# Patient Record
Sex: Female | Born: 1985 | Race: White | Hispanic: Yes | Marital: Married | Smoking: Never smoker
Health system: Southern US, Community
[De-identification: ages and names within clinical notes are randomized; demographics above are authoritative.]

## PROBLEM LIST (undated history)

## (undated) DIAGNOSIS — K5900 Constipation, unspecified: Secondary | ICD-10-CM

## (undated) DIAGNOSIS — Z789 Other specified health status: Secondary | ICD-10-CM

## (undated) DIAGNOSIS — T7840XA Allergy, unspecified, initial encounter: Secondary | ICD-10-CM

## (undated) DIAGNOSIS — M549 Dorsalgia, unspecified: Secondary | ICD-10-CM

## (undated) DIAGNOSIS — R609 Edema, unspecified: Secondary | ICD-10-CM

## (undated) DIAGNOSIS — R0602 Shortness of breath: Secondary | ICD-10-CM

## (undated) DIAGNOSIS — N200 Calculus of kidney: Secondary | ICD-10-CM

## (undated) HISTORY — DX: Dorsalgia, unspecified: M54.9

## (undated) HISTORY — PX: COSMETIC SURGERY: SHX468

## (undated) HISTORY — DX: Constipation, unspecified: K59.00

## (undated) HISTORY — DX: Shortness of breath: R06.02

## (undated) HISTORY — DX: Edema, unspecified: R60.9

## (undated) HISTORY — PX: TUBAL LIGATION: SHX77

## (undated) HISTORY — PX: APPENDECTOMY: SHX54

## (undated) HISTORY — DX: Allergy, unspecified, initial encounter: T78.40XA

## (undated) HISTORY — PX: EYE SURGERY: SHX253

---

## 2007-12-22 HISTORY — PX: EYE SURGERY: SHX253

## 2013-04-04 ENCOUNTER — Ambulatory Visit (INDEPENDENT_AMBULATORY_CARE_PROVIDER_SITE_OTHER): Payer: BC Managed Care – PPO | Admitting: Family Medicine

## 2013-04-04 VITALS — BP 114/72 | HR 73 | Temp 98.6°F | Resp 18 | Ht 62.5 in | Wt 124.0 lb

## 2013-04-04 DIAGNOSIS — Z309 Encounter for contraceptive management, unspecified: Secondary | ICD-10-CM

## 2013-04-04 DIAGNOSIS — IMO0001 Reserved for inherently not codable concepts without codable children: Secondary | ICD-10-CM

## 2013-04-04 MED ORDER — DROSPIRENONE-ETHINYL ESTRADIOL 3-0.02 MG PO TABS: 1.0000 | ORAL_TABLET | Freq: Every day | ORAL | Status: DC

## 2013-04-04 NOTE — Progress Notes (Signed)
[facility] [address], [place] [address] [phone]  Date:  04/04/2013   Name:  Allison Fischer   DOB:  1986-05-23   MRN:  [id number]  PCP:  No primary provider on file.    Chief Complaint: rx refills   History of Present Illness:  Allison Fischer is a 27 y.o. very pleasant female patient who presents with the following:  She is taking OCP- she has bought this in [place] O[facility], but now needs an rx for the same medication because [place].   She has been on OCP for several months.   Her LMP was on 9/19.  She is coming to the end of her current OCP pack   She is healthy, is a non- smoker.   She does not get migraine HA, no history of CVA, PE/DVT or MI.  No history of aura with headache.  Here today with her husband.   Her most recent pap was in May- normal  There are no active problems to display for this patient.   Past Medical History  Diagnosis Date  . Allergy     Past Surgical History  Procedure Laterality Date  . Eye surgery      History  Substance Use Topics  . Smoking status: Never Smoker   . Smokeless tobacco: Not on file  . Alcohol Use: Yes    Family History  Problem Relation Age of Onset  . Hypertension Maternal Grandmother   . Cancer Maternal Grandmother   . Heart attack Maternal Grandfather   . Heart attack Paternal Grandfather     Allergies  Allergen Reactions  . Penicillins     Medication list has been reviewed and updated.  No current outpatient prescriptions on file prior to visit.   No current facility-administered medications on file prior to visit.    Review of Systems:  As per HPI- otherwise negative.   Physical Examination: Filed Vitals:   04/04/13 1307  BP: 114/72  Pulse: 73  Temp: 98.6 F (37 C)  Resp: 18   Filed Vitals:   04/04/13 1307  Height: 5' 2.5" (1.588 m)  Weight: 124 lb (56.246 kg)   Body mass index is 22.3 kg/(m^2). Ideal Body Weight: Weight in (lb) to have BMI =  25: 138.6  GEN: WDWN, NAD, Non-toxic, A & O x 3, trim build, looks well HEENT: Atraumatic, Normocephalic. Neck supple. No masses, No LAD. Ears and Nose: No external deformity. CV: RRR, No M/G/R. No JVD. No thrill. No extra heart sounds. PULM: CTA B, no wheezes, crackles, rhonchi. No retractions. No resp. distress. No accessory muscle use. EXTR: No c/c/e NEURO Normal gait.  PSYCH: Normally interactive. Conversant. Not depressed or anxious appearing.  Calm demeanor.    Assessment and Plan: Contraception - Plan: drospirenone-ethinyl estradiol (YAZ,GIANVI,LORYNA) 3-0.02 MG tablet  Refilled her OCP- she was taking a bayer product called yasminique which is equivalent to yaz.  Follow-up as needed.  Pap is UTD.  Refilled OCP for one year   Signed Abbe Amsterdam, MD

## 2013-04-04 NOTE — Patient Instructions (Signed)
Continue to take your birth control pill as directed.  If you have any problems please let me know.  Good to meet you today!

## 2013-08-30 ENCOUNTER — Ambulatory Visit (INDEPENDENT_AMBULATORY_CARE_PROVIDER_SITE_OTHER): Payer: BC Managed Care – PPO | Admitting: Emergency Medicine

## 2013-08-30 VITALS — BP 107/72 | HR 80 | Temp 98.7°F | Resp 18 | Wt 128.0 lb

## 2013-08-30 DIAGNOSIS — R197 Diarrhea, unspecified: Secondary | ICD-10-CM

## 2013-08-30 DIAGNOSIS — A088 Other specified intestinal infections: Secondary | ICD-10-CM

## 2013-08-30 LAB — POCT CBC
Granulocyte percent: 53.9 %G (ref 37–80)
HCT, POC: 44 % (ref 37.7–47.9)
HEMOGLOBIN: 14.2 g/dL (ref 12.2–16.2)
Lymph, poc: 1.2 (ref 0.6–3.4)
MCH: 29.3 pg (ref 27–31.2)
MCHC: 32.3 g/dL (ref 31.8–35.4)
MCV: 90.9 fL (ref 80–97)
MID (cbc): 0.2 (ref 0–0.9)
MPV: 9.1 fL (ref 0–99.8)
POC Granulocyte: 1.6 — AB (ref 2–6.9)
POC LYMPH PERCENT: 39 %L (ref 10–50)
POC MID %: 7.1 %M (ref 0–12)
Platelet Count, POC: 272 10*3/uL (ref 142–424)
RBC: 4.84 M/uL (ref 4.04–5.48)
RDW, POC: 12.8 %
WBC: 3 10*3/uL — AB (ref 4.6–10.2)

## 2013-08-30 MED ORDER — ONDANSETRON 8 MG PO TBDP: 8.0000 mg | ORAL_TABLET | Freq: Three times a day (TID) | ORAL | Status: DC | PRN

## 2013-08-30 MED ORDER — LOPERAMIDE HCL 2 MG PO TABS
ORAL_TABLET | ORAL | Status: DC
Start: 2013-08-30 — End: 2014-04-15

## 2013-08-30 NOTE — Progress Notes (Signed)
[facility] [address], [place] [id number] [phone]  Date:  08/30/2013   Name:  Allison LarryLaura Lovingood   DOB:  03/10/1986   MRN:  [id number]  PCP:  No primary provider on file.    Chief Complaint: Abdominal Pain, Emesis, Dizziness and Diarrhea   History of Present Illness:  Allison Fischer is a 28 y.o. very pleasant female patient who presents with the following:  Ill since yesterday with nausea, vomiting, and fever.  Has watery stools, The patient has no complaint of blood, mucous, or pus in her stools. Frequent vomiting.  Returned from [place] 2 weeks ago.  Denies hematemesis, melena or BRBPR.  Husband ill previously.  No improvement with over the counter medications or other home remedies. Denies other complaint or health concern today.    There are no active problems to display for this patient.   Past Medical History  Diagnosis Date  . Allergy     Past Surgical History  Procedure Laterality Date  . Eye surgery      History  Substance Use Topics  . Smoking status: Never Smoker   . Smokeless tobacco: Not on file  . Alcohol Use: Yes    Family History  Problem Relation Age of Onset  . Hypertension Maternal Grandmother   . Cancer Maternal Grandmother   . Heart attack Maternal Grandfather   . Heart attack Paternal Grandfather     Allergies  Allergen Reactions  . Penicillins     Medication list has been reviewed and updated.  Current Outpatient Prescriptions on File Prior to Visit  Medication Sig Dispense Refill  . drospirenone-ethinyl estradiol (YAZ,GIANVI,LORYNA) 3-0.02 MG tablet Take 1 tablet by mouth daily.  3 Package  4   No current facility-administered medications on file prior to visit.    Review of Systems:  As per HPI, otherwise negative.    Physical Examination: Filed Vitals:   08/30/13 1538  BP: 107/72  Pulse: 80  Temp: 98.7 F (37.1 C)  Resp: 18   Filed Vitals:   08/30/13 1538  Weight: 128 lb (58.06 kg)   Body  mass index is 23.02 kg/(m^2). Ideal Body Weight:    GEN: WDWN, NAD, Non-toxic, A & O x 3 HEENT: Atraumatic, Normocephalic. Neck supple. No masses, No LAD. Ears and Nose: No external deformity. CV: RRR, No M/G/R. No JVD. No thrill. No extra heart sounds. PULM: CTA B, no wheezes, crackles, rhonchi. No retractions. No resp. distress. No accessory muscle use. ABD: S, NT, ND, +BS. No rebound. No HSM. EXTR: No c/c/e NEURO Normal gait.  PSYCH: Normally interactive. Conversant. Not depressed or anxious appearing.  Calm demeanor.    Assessment and Plan: Gastroenteritis Imodium zofran   Signed,  Phillips OdorJeffery Falen Lehrmann, MD   Results for orders placed in visit on 08/30/13  POCT CBC      Result Value Ref Range   WBC 3.0 (*) 4.6 - 10.2 K/uL   Lymph, poc 1.2  0.6 - 3.4   POC LYMPH PERCENT 39.0  10 - 50 %L   MID (cbc) 0.2  0 - 0.9   POC MID % 7.1  0 - 12 %M   POC Granulocyte 1.6 (*) 2 - 6.9   Granulocyte percent 53.9  37 - 80 %G   RBC 4.84  4.04 - 5.48 M/uL   Hemoglobin 14.2  12.2 - 16.2 g/dL   HCT, POC 53.644.0  64.437.7 - 47.9 %   MCV 90.9  80 - 97 fL  MCH, POC 29.3  27 - 31.2 pg   MCHC 32.3  31.8 - 35.4 g/dL   RDW, POC 45.4     Platelet Count, POC 272  142 - 424 K/uL   MPV 9.1  0 - 99.8 fL

## 2013-08-30 NOTE — Patient Instructions (Signed)
Diet The clear liquid diet consists of foods that are liquid or will become liquid at room temperature. Examples of foods allowed on a clear liquid diet include fruit juice, broth or bouillon, gelatin, or frozen ice pops. You should be able to see through the liquid. The purpose of this diet is to provide the necessary fluids, electrolytes (such as sodium and potassium), and energy to keep the body functioning during times when you are not able to consume a regular diet. A clear liquid diet should not be continued for long periods of time, as it is not nutritionally adequate.  A CLEAR LIQUID DIET MAY BE NEEDED:  When a sudden-onset (acute) condition occurs before or after surgery.   As the first step in oral feeding.   For fluid and electrolyte replacement in diarrheal diseases.   As a diet before certain medical tests are performed.  ADEQUACY The clear liquid diet is adequate only in ascorbic acid, according to the Recommended Dietary Allowances of the [employer].  CHOOSING FOODS Breads and Starches  Allowed: None are allowed.   Avoid: All are to be avoided.  Vegetables  Allowed: Strained vegetable juices.   Avoid: Any others.  Fruit  Allowed: Strained fruit juices and fruit drinks. Include 1 serving of citrus or vitamin C-enriched fruit juice daily.   Avoid: Any others.  Meat and Meat Substitutes  Allowed: None are allowed.   Avoid: All are to be avoided.  Milk Products  Allowed: None are allowed.   Avoid: All are to be avoided.  Soups and Combination Foods  Allowed: Clear bouillon, broth, or strained broth-based soups.   Avoid: Any others.  Desserts and Sweets  Allowed: Sugar, honey. High-protein gelatin. Flavored gelatin, ices, or frozen ice pops that do not contain milk.   Avoid: Any others.  Fats and Oils  Allowed: None are allowed.   Avoid: All are to be avoided.  Beverages  Allowed: Cereal  beverages, coffee (regular or decaffeinated), tea, or soda at the discretion of your health care provider.   Avoid: Any others.  Condiments  Allowed: Salt.   Avoid: Any others, including pepper.  Supplements  Allowed: Liquid nutrition beverages that you can see through.   Avoid: Any others that contain lactose or fiber. SAMPLE MEAL PLAN Breakfast  4 oz (120 mL) strained orange juice.   to 1 cup (120 to 240 mL) gelatin (plain or fortified).  1 cup (240 mL) beverage (coffee or tea).  Sugar, if desired. Midmorning Snack   cup (120 mL) gelatin (plain or fortified). Lunch  1 cup (240 mL) broth or consomm.  4 oz (120 mL) strained grapefruit juice.   cup (120 mL) gelatin (plain or fortified).  1 cup (240 mL) beverage (coffee or tea).  Sugar, if desired. Midafternoon Snack   cup (120 mL) fruit ice.   cup (120 mL) strained fruit juice. Dinner  1 cup (240 mL) broth or consomm.   cup (120 mL) cranberry juice.   cup (120 mL) flavored gelatin (plain or fortified).  1 cup (240 mL) beverage (coffee or tea).  Sugar, if desired. Evening Snack  4 oz (120 mL) strained apple juice (vitamin C-fortified).   cup (120 mL) flavored gelatin (plain or fortified). MAKE SURE YOU:  Understand these instructions.  Will watch your child's condition.  Will get help right away if your child is not doing well or gets worse. Document Released: 06/09/2005 Document Revised: 02/09/2013 Document Reviewed: 11/09/2012 [facility] Patient Information 2014 [place], [place]. Gastroenteritis  viral (Viral Gastroenteritis) La gastroenteritis viral tambin es conocida como gripe del [place]estmago. Este trastorno Performance Food Groupafecta el estmago y el tubo digestivo. Puede causar diarrea y vmitos repentinos. La enfermedad generalmente dura entre 3 y [address] das. La Harley-Davidsonmayora de las personas desarrolla una respuesta inmunolgica. Con el tiempo, esto elimina el virus. Mientras se desarrolla esta respuesta  natural, el virus puede afectar en forma importante su salud.  CAUSAS Muchos virus diferentes pueden causar gastroenteritis, por ejemplo el rotavirus o el norovirus. Estos virus pueden contagiarse al consumir alimentos o agua contaminados. Tambin puede contagiarse al compartir utensilios u otros artculos personales con una persona infectada o al tocar una superficie contaminada.  SNTOMAS Los sntomas ms comunes son diarrea y vmitos. Estos problemas pueden causar una prdida grave de lquidos corporales(deshidratacin) y un desequilibrio de sales corporales(electrolitos). Otros sntomas pueden ser:   Grant RutsFiebre.  Dolor de Turkmenistancabeza.  Fatiga.  Dolor abdominal. DIAGNSTICO  El mdico podr hacer el diagnstico de gastroenteritis viral basndose en los sntomas y el examen fsico Tambin pueden tomarle una muestra de materia fecal para diagnosticar la presencia de virus u otras infecciones.  TRATAMIENTO Esta enfermedad generalmente desaparece sin tratamiento. Los tratamientos estn dirigidos a Social research officer, governmentla rehidratacin. Los casos ms graves de gastroenteritis viral implican vmitos tan intensos que no es posible retener lquidos. En Franklin Resourcesestos casos, los lquidos deben administrarse a travs de una va intravenosa (IV).  INSTRUCCIONES PARA EL CUIDADO DOMICILIARIO  Beba suficientes lquidos para mantener la orina clara o de color amarillo plido. Beba pequeas cantidades de lquido con frecuencia y aumente la cantidad segn la tolerancia.  Pida instrucciones especficas a su mdico con respecto a la rehidratacin.  Evite:  Alimentos que Nurse, adulttengan mucha azcar.  Alcohol.  Gaseosas.  TabacoVista Lawman.  Jugos.  Bebidas con cafena.  Lquidos muy calientes o fros.  Alimentos muy grasos.  Comer demasiado a Licensed conveyancerla vez.  Productos lcteos hasta 24 a 48 horas despus de que se detenga la diarrea.  Puede consumir probiticos. Los probiticos son cultivos activos de bacterias beneficiosas. Pueden disminuir la cantidad  y el nmero de deposiciones diarreicas en el adulto. Se encuentran en los yogures con cultivos activos y en los suplementos.  Lave bien sus manos para evitar que se disemine el virus.  Slo tome medicamentos de venta libre o recetados para Primary school teachercalmar el dolor, las molestias o bajar la fiebre segn las indicaciones de su mdico. No administre aspirina a los nios. Los medicamentos antidiarreicos no son recomendables.  Consulte a su mdico si puede seguir tomando sus medicamentos recetados o de H. J. Heinzventa libre.  Cumpla con todas las visitas de control, segn le indique su mdico. SOLICITE ATENCIN MDICA DE INMEDIATO SI:  No puede retener lquidos.  No hay emisin de orina durante 6 a 8 horas.  Le falta el aire.  Observa sangre en el vmito (se ve como caf molido) o en la materia fecal.  Siente dolor abdominal que empeora o se concentra en una zona pequea (se localiza).  Tiene nuseas o vmitos persistentes.  Tiene fiebre.  El paciente es un nio menor de 3 meses y [place]ene fiebre.  El paciente es un nio mayor de 3 meses, tiene fiebre y sntomas persistentes.  El paciente es un nio mayor de 3 meses y tiene fiebre y sntomas que empeoran repentinamente.  El paciente es un beb y no tiene lgrimas cuando llora. ASEGRESE QUE:   Comprende estas instrucciones.  Controlar su enfermedad.  Solicitar ayuda inmediatamente si no mejora o si empeora. Document Released: 06/09/2005  y tiene fiebre y síntomas que empeoran repentinamente.  · El paciente es un bebé y no tiene lágrimas cuando llora.  ASEGÚRESE QUE:   · Comprende estas instrucciones.  · Controlará su enfermedad.  · Solicitará ayuda inmediatamente si no mejora o si empeora.  Document Released: 06/09/2005 Document Revised: 09/01/2011  ExitCare® Patient Information ©2014 [employer].

## 2013-08-31 LAB — COMPREHENSIVE METABOLIC PANEL
ALT: 19 U/L (ref 0–35)
AST: 24 U/L (ref 0–37)
Albumin: 4.3 g/dL (ref 3.5–5.2)
Alkaline Phosphatase: 39 U/L (ref 39–117)
BUN: 6 mg/dL (ref 6–23)
CALCIUM: 8.4 mg/dL (ref 8.4–10.5)
CHLORIDE: 106 meq/L (ref 96–112)
CO2: 23 meq/L (ref 19–32)
CREATININE: 0.66 mg/dL (ref 0.50–1.10)
Glucose, Bld: 102 mg/dL — ABNORMAL HIGH (ref 70–99)
Potassium: 4 mEq/L (ref 3.5–5.3)
Sodium: 138 mEq/L (ref 135–145)
Total Bilirubin: 0.9 mg/dL (ref 0.2–1.2)
Total Protein: 6.5 g/dL (ref 6.0–8.3)

## 2013-09-06 LAB — OVA AND PARASITE EXAMINATION: OP: NONE SEEN

## 2013-09-07 LAB — STOOL CULTURE

## 2014-03-06 ENCOUNTER — Ambulatory Visit (INDEPENDENT_AMBULATORY_CARE_PROVIDER_SITE_OTHER): Payer: BC Managed Care – PPO | Admitting: Family Medicine

## 2014-03-06 ENCOUNTER — Encounter: Payer: Self-pay | Admitting: Family Medicine

## 2014-03-06 VITALS — BP 100/72 | HR 79 | Temp 97.9°F | Resp 16 | Ht 62.5 in | Wt 133.0 lb

## 2014-03-06 DIAGNOSIS — Z23 Encounter for immunization: Secondary | ICD-10-CM

## 2014-03-06 DIAGNOSIS — Z3049 Encounter for surveillance of other contraceptives: Secondary | ICD-10-CM

## 2014-03-06 LAB — POCT URINE PREGNANCY: PREG TEST UR: NEGATIVE

## 2014-03-06 MED ORDER — DROSPIRENONE-ETHINYL ESTRADIOL 3-0.02 MG PO TABS: 1.0000 | ORAL_TABLET | Freq: Every day | ORAL | Status: DC

## 2014-03-06 NOTE — Progress Notes (Signed)
[facility] [address], [place] [address] [phone]  Date:  03/06/2014   Name:  Allison Fischer   DOB:  10/08/1985   MRN:  [id number]  PCP:  No primary provider on file.    Chief Complaint: Contraception   History of Present Illness:  Allison Fischer is a 28 y.o. very pleasant female patient who presents with the following:  She is here today for a refill of her OCP.  We made a slight change to her OCP last year, as the OCP she was on in [place] is not available here. She has not had any menses since she started this new pill.   She saw OB-GYN in January, had some labs- told all ok.   She also had a pap per her OBG in January. She is a little concerned because she and her husband would like to concieve in the year year.    There are no active problems to display for this patient.   Past Medical History  Diagnosis Date  . Allergy     Past Surgical History  Procedure Laterality Date  . Eye surgery      History  Substance Use Topics  . Smoking status: Never Smoker   . Smokeless tobacco: Not on file  . Alcohol Use: Yes    Family History  Problem Relation Age of Onset  . Hypertension Maternal Grandmother   . Cancer Maternal Grandmother   . Heart attack Maternal Grandfather   . Heart attack Paternal Grandfather     Allergies  Allergen Reactions  . Penicillins     Medication list has been reviewed and updated.  Current Outpatient Prescriptions on File Prior to Visit  Medication Sig Dispense Refill  . drospirenone-ethinyl estradiol (YAZ,GIANVI,LORYNA) 3-0.02 MG tablet Take 1 tablet by mouth daily.  3 Package  4  . loperamide (IMODIUM A-D) 2 MG tablet 2 now and one hourly prn diarrhea.  Max 8 tabs in 24 hours  30 tablet  0  . ondansetron (ZOFRAN-ODT) 8 MG disintegrating tablet Take 1 tablet (8 mg total) by mouth every 8 (eight) hours as needed for nausea.  30 tablet  0   No current facility-administered medications on file prior to visit.     Review of Systems:  As per HPI- otherwise negative.   Physical Examination: Filed Vitals:   03/06/14 1104  BP: 100/72  Pulse: 79  Temp: 97.9 F (36.6 C)  Resp: 16   Filed Vitals:   03/06/14 1104  Height: 5' 2.5" (1.588 m)  Weight: 133 lb (60.328 kg)   Body mass index is 23.92 kg/(m^2). Ideal Body Weight: Weight in (lb) to have BMI = 25: 138.6  GEN: WDWN, NAD, Non-toxic, A & O x 3, looks well HEENT: Atraumatic, Normocephalic. Neck supple. No masses, No LAD. Ears and Nose: No external deformity. CV: RRR, No M/G/R. No JVD. No thrill. No extra heart sounds. PULM: CTA B, no wheezes, crackles, rhonchi. No retractions. No resp. distress. No accessory muscle use. EXTR: No c/c/e NEURO Normal gait.  PSYCH: Normally interactive. Conversant. Not depressed or anxious appearing.  Calm demeanor.   Results for orders placed in visit on 03/06/14  POCT URINE PREGNANCY      Result Value Ref Range   Preg Test, Ur Negative      Assessment and Plan: Encounter for surveillance of other contraceptive - Plan: drospirenone-ethinyl estradiol (YAZ,GIANVI,LORYNA) 3-0.02 MG tablet, POCT urine pregnancy  Immunization due - Plan: Flu Vaccine QUAD 36+  mos IM  Flu shot and refilled her OCP today.  She has not had a period in the last year since starting this particular pill.  However otherwise she is happy with it and does not wish to change.  She did see OBG in January and had labs, told "all ok."  She and her husband plan to try and conceive in about a year; discussed this with her.  Recommend that she start PNV prior to conception, and as she is worried about fertility she can stop her OCP a couple of months early and use condoms until she is ready to start trying in earnest.    Signed Abbe Amsterdam, MD

## 2014-03-06 NOTE — Patient Instructions (Signed)
Good to see you today!  Take care and let us know if you need anything

## 2014-04-14 DIAGNOSIS — Z79899 Other long term (current) drug therapy: Secondary | ICD-10-CM | POA: Diagnosis not present

## 2014-04-14 DIAGNOSIS — N200 Calculus of kidney: Secondary | ICD-10-CM | POA: Insufficient documentation

## 2014-04-14 DIAGNOSIS — N390 Urinary tract infection, site not specified: Secondary | ICD-10-CM | POA: Insufficient documentation

## 2014-04-14 DIAGNOSIS — Z3202 Encounter for pregnancy test, result negative: Secondary | ICD-10-CM | POA: Diagnosis not present

## 2014-04-14 DIAGNOSIS — R319 Hematuria, unspecified: Secondary | ICD-10-CM | POA: Diagnosis present

## 2014-04-14 DIAGNOSIS — Z880 Allergy status to penicillin: Secondary | ICD-10-CM | POA: Insufficient documentation

## 2014-04-15 ENCOUNTER — Emergency Department (HOSPITAL_COMMUNITY)
Admission: EM | Admit: 2014-04-15 | Discharge: 2014-04-15 | Disposition: A | Discharge status: Home / Self Care | Payer: BC Managed Care – PPO | Attending: Emergency Medicine | Admitting: Emergency Medicine

## 2014-04-15 ENCOUNTER — Emergency Department (HOSPITAL_COMMUNITY): Payer: BC Managed Care – PPO

## 2014-04-15 ENCOUNTER — Encounter (HOSPITAL_COMMUNITY): Payer: Self-pay | Admitting: Emergency Medicine

## 2014-04-15 DIAGNOSIS — N200 Calculus of kidney: Secondary | ICD-10-CM

## 2014-04-15 DIAGNOSIS — N390 Urinary tract infection, site not specified: Secondary | ICD-10-CM

## 2014-04-15 HISTORY — DX: Calculus of kidney: N20.0

## 2014-04-15 LAB — COMPREHENSIVE METABOLIC PANEL
ALK PHOS: 43 U/L (ref 39–117)
ALT: 15 U/L (ref 0–35)
AST: 25 U/L (ref 0–37)
Albumin: 3.7 g/dL (ref 3.5–5.2)
Anion gap: 13 (ref 5–15)
BILIRUBIN TOTAL: 0.7 mg/dL (ref 0.3–1.2)
BUN: 8 mg/dL (ref 6–23)
CHLORIDE: 102 meq/L (ref 96–112)
CO2: 23 meq/L (ref 19–32)
CREATININE: 0.67 mg/dL (ref 0.50–1.10)
Calcium: 9.3 mg/dL (ref 8.4–10.5)
GFR calc Af Amer: >90 mL/min (ref >90)
Glucose, Bld: 119 mg/dL — ABNORMAL HIGH (ref 70–99)
POTASSIUM: 3.7 meq/L (ref 3.7–5.3)
SODIUM: 138 meq/L (ref 137–147)
Total Protein: 7.2 g/dL (ref 6.0–8.3)

## 2014-04-15 LAB — CBC WITH DIFFERENTIAL/PLATELET
BASOS ABS: 0 10*3/uL (ref 0.0–0.1)
Basophils Relative: 0 % (ref 0–1)
Eosinophils Absolute: 0 10*3/uL (ref 0.0–0.7)
Eosinophils Relative: 0 % (ref 0–5)
HEMATOCRIT: 35.3 % — AB (ref 36.0–46.0)
Hemoglobin: 12.2 g/dL (ref 12.0–15.0)
LYMPHS PCT: 26 % (ref 12–46)
Lymphs Abs: 2.4 10*3/uL (ref 0.7–4.0)
MCH: 30 pg (ref 26.0–34.0)
MCHC: 34.6 g/dL (ref 30.0–36.0)
MCV: 86.7 fL (ref 78.0–100.0)
Monocytes Absolute: 0.7 10*3/uL (ref 0.1–1.0)
Monocytes Relative: 7 % (ref 3–12)
NEUTROS ABS: 6.2 10*3/uL (ref 1.7–7.7)
NEUTROS PCT: 67 % (ref 43–77)
Platelets: 228 10*3/uL (ref 150–400)
RBC: 4.07 MIL/uL (ref 3.87–5.11)
RDW: 11.9 % (ref 11.5–15.5)
WBC: 9.3 10*3/uL (ref 4.0–10.5)

## 2014-04-15 LAB — URINALYSIS, ROUTINE W REFLEX MICROSCOPIC
Bilirubin Urine: NEGATIVE
Glucose, UA: NEGATIVE mg/dL
Ketones, ur: NEGATIVE mg/dL
Nitrite: NEGATIVE
PROTEIN: NEGATIVE mg/dL
Specific Gravity, Urine: 1.01 (ref 1.005–1.030)
UROBILINOGEN UA: 0.2 mg/dL (ref 0.0–1.0)
pH: 5.5 (ref 5.0–8.0)

## 2014-04-15 LAB — URINE MICROSCOPIC-ADD ON

## 2014-04-15 LAB — PREGNANCY, URINE: Preg Test, Ur: NEGATIVE

## 2014-04-15 LAB — LIPASE, BLOOD: Lipase: 34 U/L (ref 11–59)

## 2014-04-15 MED ORDER — SODIUM CHLORIDE 0.9 % IV BOLUS (SEPSIS)
1000.0000 mL | Freq: Once | INTRAVENOUS | Status: AC
  Administered 2014-04-15: 1000 mL via INTRAVENOUS

## 2014-04-15 MED ORDER — CIPROFLOXACIN HCL 500 MG PO TABS: 500.0000 mg | ORAL_TABLET | Freq: Two times a day (BID) | ORAL | Status: DC

## 2014-04-15 MED ORDER — CIPROFLOXACIN HCL 500 MG PO TABS
500.0000 mg | ORAL_TABLET | Freq: Once | ORAL | Status: AC
  Administered 2014-04-15: 500 mg via ORAL
  Filled 2014-04-15: qty 1

## 2014-04-15 NOTE — ED Notes (Signed)
Pt arrived to the ED with a complaint of hematuria.  Pt states she has bilateral lower back pain with pain radiating to the lower pelvic pain.  Pt also states she has been urinating blood since 1900 hours yesterday.  Pt has a history of kidney stones.

## 2014-04-15 NOTE — ED Provider Notes (Signed)
CSN: [id number]     Arrival date & time 04/14/14  2325 History   First MD Initiated Contact with Patient 04/15/14 0204     Chief Complaint  Patient presents with  . Hematuria     (Consider location/radiation/quality/duration/timing/severity/associated sxs/prior Treatment) HPI Allison Fischer is a 28 y.o. female with no student past medical history coming in with hematuria. Patient states she had sudden onset of bilateral flank pain occurring at 5 PM tonight. This radiated to her suprapubic area, and stabbing sensation. Since then she's also had gross hematuria with blood clots. She began to drink a lot of water and her urine cleared to a light pink color. She also meds to dysuria and increased frequency during the interval. She denies any regular vaginal bleeding or vaginal discharge. Her last menstrual cycle was normal. She denies any fevers. She had emesis x3. She took Tylenol with good relief of pain at home. No changes in her bowel movements. She denies any fevers or recent infections. Patient is no further complaints.   10 Systems reviewed and are negative for acute change except as noted in the HPI.    Past Medical History  Diagnosis Date  . Allergy   . Kidney stones    Past Surgical History  Procedure Laterality Date  . Eye surgery     Family History  Problem Relation Age of Onset  . Hypertension Maternal Grandmother   . Cancer Maternal Grandmother   . Heart attack Maternal Grandfather   . Heart attack Paternal Grandfather    History  Substance Use Topics  . Smoking status: Never Smoker   . Smokeless tobacco: Not on file  . Alcohol Use: Yes   OB History   Grav Para Term Preterm Abortions TAB SAB Ect Mult Living                 Review of Systems    Allergies  Penicillins  Home Medications   Prior to Admission medications   Medication Sig Start Date End Date Taking? Authorizing Provider  acetaminophen (TYLENOL) 500 MG tablet Take 500 mg by mouth every 6 (six)  hours as needed for mild pain.   Yes Historical Provider, MD  drospirenone-ethinyl estradiol (YAZ,GIANVI,LORYNA) 3-0.02 MG tablet Take 1 tablet by mouth daily. 03/06/14  Yes Jessica C Copland, MD  ipratropium (ATROVENT) 0.03 % nasal spray Place 2 sprays into both nostrils once.   Yes Historical Provider, MD  Multiple Vitamin (MULTIVITAMIN WITH MINERALS) TABS tablet Take 1 tablet by mouth daily.   Yes Historical Provider, MD   BP 119/70  Pulse 88  Temp(Src) 98.1 F (36.7 C) (Oral)  Resp 16  SpO2 100% Physical Exam  Nursing note and vitals reviewed. Constitutional: She is oriented to person, place, and time. She appears well-developed and well-nourished. No distress.  HENT:  Head: Normocephalic and atraumatic.  Nose: Nose normal.  Mouth/Throat: Oropharynx is clear and moist. No oropharyngeal exudate.  Eyes: Conjunctivae and EOM are normal. Pupils are equal, round, and reactive to light. No scleral icterus.  Neck: Normal range of motion. Neck supple. No JVD present. No tracheal deviation present. No thyromegaly present.  Cardiovascular: Normal rate, regular rhythm and normal heart sounds.  Exam reveals no gallop and no friction rub.   No murmur heard. Pulmonary/Chest: Effort normal and breath sounds normal. No respiratory distress. She has no wheezes. She exhibits no tenderness.  Abdominal: Soft. Bowel sounds are normal. She exhibits no distension and no mass. There is tenderness. There is no rebound  and no guarding.  Mild suprapubic tenderness to palpation. No CVA tenderness bilaterally.  Musculoskeletal: Normal range of motion. She exhibits no edema and no tenderness.  Lymphadenopathy:    She has no cervical adenopathy.  Neurological: She is alert and oriented to person, place, and time. No cranial nerve deficit. She exhibits normal muscle tone.  Skin: Skin is warm and dry. No rash noted. She is not diaphoretic. No erythema. No pallor.    ED Course  Procedures (including critical care  time) Labs Review Labs Reviewed  URINALYSIS, ROUTINE W REFLEX MICROSCOPIC - Abnormal; Notable for the following:    APPearance CLOUDY (*)    Hgb urine dipstick LARGE (*)    Leukocytes, UA LARGE (*)    All other components within normal limits  URINE MICROSCOPIC-ADD ON - Abnormal; Notable for the following:    Squamous Epithelial / LPF FEW (*)    Bacteria, UA FEW (*)    All other components within normal limits  CBC WITH DIFFERENTIAL - Abnormal; Notable for the following:    HCT 35.3 (*)    All other components within normal limits  PREGNANCY, URINE  COMPREHENSIVE METABOLIC PANEL  LIPASE, BLOOD    Imaging Review Ct Abdomen Pelvis Wo Contrast  04/15/2014   CLINICAL DATA:  Low back pain and anterior pelvic pain nonspecific is decide. Hematuria.  EXAM: CT ABDOMEN AND PELVIS WITHOUT CONTRAST  TECHNIQUE: Multidetector CT imaging of the abdomen and pelvis was performed following the standard protocol without IV contrast.  COMPARISON:  None.  FINDINGS: The lung bases are clear.  Kidneys are symmetrical in size and shape. No hydronephrosis or hydroureter. No renal, ureteral, or bladder stones. No bladder wall thickening.  The unenhanced appearance of the liver, spleen, gallbladder, pancreas, adrenal glands, abdominal aorta, inferior vena cava, and retroperitoneal lymph nodes is unremarkable. Stomach, small bowel, and colon are not abnormally distended. Diffusely stool-filled colon. No free air or free fluid in the abdomen.  Pelvis: Appendix is normal. Small amount of free fluid in the pelvis likely is physiologic. Uterus and ovaries are not enlarged. No pelvic mass or lymphadenopathy. No evidence of diverticulitis. No destructive bone lesions.  IMPRESSION: No renal or ureteral stone or obstruction identified.   Electronically Signed   By: Burman NievesWilliam  Stevens M.D.   On: 04/15/2014 03:22     EKG Interpretation None      MDM   Final diagnoses:  Kidney stone    Patient presents emergency  department for sudden onset flank pain and hematuria. She has also had dysuria and increased frequency.  Urinalysis reveals a mild infection, CT scan does not show any nephrolithiasis. Will treat her for UTI and advised her to follow with urology for continued care. She was given a liter of IV fluid and keflex in the emergency department. Patient is not requesting any pain meds and she states her pain is subsiding. She has urinated multiple times emergency department it is possible that the stone has passed before the CT scan. Her vital signs remain within her normal limits and she is safe for discharge.    Tomasita CrumbleAdeleke Jeneane Pieczynski, MD 04/15/14 [phone]

## 2014-04-15 NOTE — Discharge Instructions (Signed)
Kidney Stones Ms. Allison LyonsCasey, you were seen today for her back and belly pain. Your CT scan did not show any kidney stones however they may have passed before the CT scan. Your urine shows a mild infection, take antibiotics as prescribed. Followup with urology for continued treatment. If any of her symptoms worsen come back to the emergency department immediately for repeat evaluation. Thank you. Kidney stones (urolithiasis) are solid masses that form inside your kidneys. The intense pain is caused by the stone moving through the kidney, ureter, bladder, and urethra (urinary tract). When the stone moves, the ureter starts to spasm around the stone. The stone is usually passed in your pee (urine).  HOME CARE  Drink enough fluids to keep your pee clear or pale yellow. This helps to get the stone out.  Strain all pee through the provided strainer. Do not pee without peeing through the strainer, not even once. If you pee the stone out, catch it in the strainer. The stone may be as small as a grain of salt. Take this to your doctor. This will help your doctor figure out what you can do to try to prevent more kidney stones.  Only take medicine as told by your doctor.  Follow up with your doctor as told.  Get follow-up X-rays as told by your doctor. GET HELP IF: You have pain that gets worse even if you have been taking pain medicine. GET HELP RIGHT AWAY IF:   Your pain does not get better with medicine.  You have a fever or shaking chills.  Your pain increases and gets worse over 18 hours.  You have new belly (abdominal) pain.  You feel faint or pass out.  You are unable to pee. MAKE SURE YOU:   Understand these instructions.  Will watch your condition.  Will get help right away if you are not doing well or get worse. Document Released: 11/26/2007 Document Revised: 02/09/2013 Document Reviewed: 11/10/2012 [facility] Patient Information 2015 [place], [place]. This information is not intended to  replace advice given to you by your health care provider. Make sure you discuss any questions you have with your health care provider. Urinary Tract Infection A urinary tract infection (UTI) can occur any place along the urinary tract. The tract includes the kidneys, ureters, bladder, and urethra. A type of germ called bacteria often causes a UTI. UTIs are often helped with antibiotic medicine.  HOME CARE   If given, take antibiotics as told by your doctor. Finish them even if you start to feel better.  Drink enough fluids to keep your pee (urine) clear or pale yellow.  Avoid tea, drinks with caffeine, and bubbly (carbonated) drinks.  Pee often. Avoid holding your pee in for a long time.  Pee before and after having sex (intercourse).  Wipe from front to back after you poop (bowel movement) if you are a woman. Use each tissue only once. GET HELP RIGHT AWAY IF:   You have back pain.  You have lower belly (abdominal) pain.  You have chills.  You feel sick to your stomach (nauseous).  You throw up (vomit).  Your burning or discomfort with peeing does not go away.  You have a fever.  Your symptoms are not better in 3 days. MAKE SURE YOU:   Understand these instructions.  Will watch your condition.  Will get help right away if you are not doing well or get worse. Document Released: 11/26/2007 Document Revised: 03/03/2012 Document Reviewed: 01/08/2012 ExitCare Patient Information  2015 ExitCare, LLC. This information is not intended to replace advice given to you by your health care provider. Make sure you discuss any questions you have with your health care provider.  Emergency Department Resource Guide 1) Find a Doctor and Pay Out of Pocket Although you won't have to find out who is covered by your insurance plan, it is a good idea to ask around and get recommendations. You will then need to call the office and see if the doctor you have chosen will accept you as a new  patient and what types of options they offer for patients who are self-pay. Some doctors offer discounts or will set up payment plans for their patients who do not have insurance, but you will need to ask so you aren't surprised when you get to your appointment.  2) Contact Your Local Health Department Not all health departments have doctors that can see patients for sick visits, but many do, so it is worth a call to see if yours does. If you don't know where your local health department is, you can check in your phone book. The CDC also has a tool to help you locate your state's health department, and many state websites also have listings of all of their local health departments.  3) Find a Walk-in Clinic If your illness is not likely to be very severe or complicated, you may want to try a walk in clinic. These are popping up all over the country in pharmacies, drugstores, and shopping centers. They're usually staffed by nurse practitioners or physician assistants that have been trained to treat common illnesses and complaints. They're usually fairly quick and inexpensive. However, if you have serious medical issues or chronic medical problems, these are probably not your best option.  No Primary Care Doctor: - Call Health Connect at  [phone] - they can help you locate a primary care doctor that  accepts your insurance, provides certain services, etc. - Physician Referral Service- [phone]  Chronic Pain Problems: Organization         Address  Phone   Notes  [facility]  [phone] Patients need to be referred by their primary care doctor.   Medication Assistance: Organization         Address  Phone   Notes  [facility] Medication [facility] [address] [place] [place], [place] [address] [phone] --Must be a resident of [facility] -- Must have NO insurance coverage whatsoever (no Medicaid/ Medicare, etc.) -- The pt. MUST have a primary  care doctor that directs their care regularly and follows them in the community   [facility]  [phone]   [employer]  [phone]    Agencies that provide inexpensive medical care: Organization         Address  Phone   Notes  [facility]  [phone]   [facility]    [phone]   [facility] [address] [place], [place] [address] [phone]   [facility] [address] [place] [phone]   Planned Parenthood    [phone]   [facility]    [phone]   [facility]  [address], [place] Phone:  [phone], Fax:  [phone] Hours of Operation:  9 am - 6 pm, M-F.  Also accepts Medicaid/Medicare and self-pay.  [facility]  [address], [place] Phone: [phone], Fax: [phone]. Hours of Operation:  8:30 am - 5:30 pm, M-F.  Also accepts Medicaid and self-pay.  [facility] [place] [address] [place] Phone: [phone]   [facility] [address] Natasha Bence [place], [place] [phone], Ext. [phone] Mondays & Thursdays: 7-9 AM.  First 15 patients are seen on a first come, first serve basis.    Medicaid-accepting [facility] Providers:  Organization         Address  Phone   Notes  [facility] [address], [place] [phone] Also accepts self-pay patients.  [facility] [address] Laurell Josephs [place], [place]  [phone]   [facility] [address], [place] [phone]   [facility] [address], [place] [phone]   Renaye Rakers [address], [place]   [phone] Only accepts [place] Access [place] patients after they have their name applied to their card.   Self-Pay (no insurance) in [facility]:  Organization         Address  Phone   Notes  Sickle Cell Patients, [facility] [address] [place], [place] [phone]   [facility] [address] [place], [place] [phone]   [facility]  [address], [place] [phone]   [facility]/Dr. Osei-Bonsu  [address] [place] or [address], [place] [phone] Phone number for both [place] and [place] locations is the same.  [facility] [address] [place] [phone]   [facility] [address] [place] or [address] [phone]   [facility] [address], [place] [phone], phone; [phone], fax Sees patients 1st and 3rd Saturday of every month.  Must not qualify for public or private insurance (i.e. Medicaid, Medicare, [place] Health Choice, Veterans' Benefits)  Household income should be no more than 200% of the poverty level The clinic cannot treat you if you are pregnant or think you are pregnant  Sexually transmitted diseases are not treated at the clinic.    Dental Care: Organization         Address  Phone  Notes  [facility] Department of [facility] [address] [place], [place] [phone] Accepts children up to age 49 who are enrolled in [place] or [place] Health Choice; pregnant women with a Medicaid card; and children who have applied for Medicaid or [place] Health Choice, but were declined, whose parents can pay a reduced fee at time of service.  [facility] Department of [facility]  [address], [place] [phone] Accepts children up to age 3 who are enrolled in [place] or  Health Choice; pregnant women with a Medicaid card; and children who have applied for Medicaid or  Health Choice, but were declined, whose parents can  pay a reduced fee at time of service.  [facility]  [address] [place], [place] [phone] Patients are seen by appointment only. Walk-ins are not accepted. [facility] will see patients 48 years of age and older. Monday - Tuesday (8am-5pm) Most Wednesdays (8:30-5pm) $30 per visit, cash only  [facility]  [address], [facility] [phone] Patients are seen by appointment only. Walk-ins are not accepted. [facility] will see patients 27 years of age and older. One Wednesday Evening (Monthly: Volunteer Based).  $30 per visit, cash only  [employer] [facility] [facility]  [phone] for adults; Children under age 234, call Graduate Pediatric Dentistry at [phone]. Children aged 714-14, please call [phone] to request a pediatric application.  Dental services are provided in all areas of dental care including fillings, crowns and bridges, complete and partial dentures, implants, gum treatment, root canals, and extractions. Preventive care is also provided. Treatment is provided to both adults and children. Patients are selected via a lottery and there is often a waiting list.   [facility] [address], [place]  [phone] [email or web address]   [facility] [address], [place], [place] [phone], Ext. [phone] Second and Fourth Thursday of each month, opens at 6:30 AM; Clinic ends at 9 AM.  Patients are seen on a first-come first-served basis, and a limited number are seen during each clinic.   [facility]  [address], [place], [place] [phone]   Eligibility Requirements You must have lived in [place], [place], or [place] counties for at least the last three months.   You cannot be eligible for state or federal sponsored [place] Cityhealthcare insurance, including CIGNAVeterans Administration, [place]icaid, or [employer]Medicare.   You generally cannot be eligible for healthcare  insurance through your employer.    How to apply: Eligibility screenings are held every Tuesday and Wednesday afternoon from 1:00 pm until 4:00 pm. You do not need an appointment for the interview!  [facility] [address], [place], [place] [phone]   [facility]  [phone]   [facility]  [phone]   [facility]  [phone]    Behavioral Health Resources in the Community: Intensive Outpatient Programs Organization         Address  Phone  Notes  [facility] [address], [place], [place] [phone]   [facility] [address], [place], [place] [phone]   [facility] [address], [place], [place]  [phone]   [facility] [address],  [place], [place] [phone] or [phone]   Substance Abuse Resources Organization         Address  Phone  Notes  [facility]  [phone]   [facility]  [phone]   [facility]  [phone]   [facility]  [phone]   Residential & Outpatient Substance Abuse Program  [phone]   Psychological Services Organization         Address  Phone  Notes  [facility]  [phone]- [phone]   [facility]  [phone]- [phone]   [facility] [address], [place] [phone] or [phone]    Mobile Crisis Teams Organization         Address  Phone  Notes  [facility], Mobile Crisis Care Unit  [phone]   [facility]  [address] [place], [place] [phone]   Doristine LocksSharon DeEsch [address] and [place] [phone]    Self-Help/Support Groups Organization         Address  Phone             Notes  [facility]  [facility] [place] - variety of support groups  [phone]- [phone] Call for more information  Narcotics Anonymous (NA),  [facility] [address], [employer] [place]  2 meetings at this location   Statistician         Address  Phone  Notes  [facility] [address],    [place]  [phone]   [facility]  [address], [place] [address], [place], [place] [phone]   [facility] [address] [place], [place] [phone] Admissions: 8am-3pm M-F  [facility] [address],    [place], [place] [phone]   [facility] [address] [place], [place], [place] [phone]   [facility] [address],  [place], [place] [phone]   [facility] - Intensive Outpatient [address] Laurell Joseph[address], [place], [place] [phone]   [facility] [address]  [place], [place] [phone] or [phone]   [facility] ([facility]) [address], [place], [place] [phone] Accepts Medicaid  [facility] [address]  [place] [phone] Substance Abuse/Addiction Treatment   [facility] Organization         Address  Phone  Notes  [facility]  [phone]   Angie Fava, PhD [address] Ervin Knack [place], [place]   [phone] or [phone]   [facility]   [address] [place], [place] [phone]   [facility] [address] [place], [place] [phone] Insurance/Medicaid/sponsorship through [facility] [address]                                    [place], [place] [phone] Therapy/tele-psych/case  [facility] [address], [place] [phone]    Dr. Lolly Mustache  [phone]   [facility]  [employer] [facility] Dept. 1) [address] [place] 2) [address], [place] 3)  [address], [place] [phone]  [phone]    [facility] Child Abuse Hotline [phone] or [phone] (After Hours)

## 2014-06-06 ENCOUNTER — Ambulatory Visit (INDEPENDENT_AMBULATORY_CARE_PROVIDER_SITE_OTHER): Payer: BC Managed Care – PPO | Admitting: Physician Assistant

## 2014-06-06 VITALS — BP 120/74 | HR 84 | Temp 98.3°F | Resp 20 | Ht 62.0 in | Wt 139.4 lb

## 2014-06-06 DIAGNOSIS — Z23 Encounter for immunization: Secondary | ICD-10-CM

## 2014-06-06 NOTE — Progress Notes (Signed)
   Subjective:    Patient ID: Allison Fischer, female    DOB: 04/15/1986, 28 y.o.   MRN: [id number]  Chief Complaint  Patient presents with  . Immunizations    2nd Hep A     HPI  28 year old female otherwise healthy seeks to have the second in series of Hepatitis A.  She last was vaccinated 05/11/2013, more than 1 year ago.  She initially thought that she only needed one vaccination until a provider mentioned that she needed the second dosage.  She is attempting to have all her vaccinations up to date for family planning that she will begin in July.  She is currently on the Yas, and will discontinue use in July to prepare for conception.    Review of Systems ROS otherwise unremarkable unless listed above  Past Medical History  Diagnosis Date  . Allergy   . Kidney stones     Current Outpatient Prescriptions on File Prior to Visit  Medication Sig Dispense Refill  . drospirenone-ethinyl estradiol (YAZ,GIANVI,LORYNA) 3-0.02 MG tablet Take 1 tablet by mouth daily. 3 Package 4  . Multiple Vitamin (MULTIVITAMIN WITH MINERALS) TABS tablet Take 1 tablet by mouth daily.     No current facility-administered medications on file prior to visit.    Family History  Problem Relation Age of Onset  . Hypertension Maternal Grandmother   . Cancer Maternal Grandmother   . Heart attack Maternal Grandfather   . Heart attack Paternal Grandfather    History   Social History  . Marital Status: Married    Spouse Name: N/A    Number of Children: N/A  . Years of Education: N/A   Social History Main Topics  . Smoking status: Never Smoker   . Smokeless tobacco: Never Used  . Alcohol Use: 0.0 oz/week    0 Not specified per week  . Drug Use: No  . Sexual Activity: None   Other Topics Concern  . None   Social History Narrative   Past Surgical History  Procedure Laterality Date  . Eye surgery        Objective:   Physical Exam  Constitutional: She is oriented to person, place, and time.  Vital signs are normal. She appears well-developed and well-nourished. No distress.  BP 120/74 mmHg  Pulse 84  Temp(Src) 98.3 F (36.8 C) (Oral)  Resp 20  Ht 5\' 2"  (1.575 m)  Wt 139 lb 6 oz (63.22 kg)  BMI 25.49 kg/m2  SpO2 100%    Neck: Normal range of motion.  Cardiovascular: Normal rate.   Pulmonary/Chest: Effort normal. No respiratory distress.  Neurological: She is alert and oriented to person, place, and time.  Psychiatric: She has a normal mood and affect. Her behavior is normal. Judgment and thought content normal.  Vitals reviewed.     Assessment & Plan:  28 year old female who is here today to have 2nd immunization of hepatitis A.  She currently is out of the 6-12 month window for recommended 2nd vaccination.  However, the recommendation is to continue the series without restarting, so we will do her final hep A vaccine at this time.    Need for prophylactic vaccination and inoculation against viral hepatitis Hepatitis A vaccine adult IM   Trena PlattStephanie English, PA-C [facility] 12/16/201512:16 AM

## 2015-03-20 LAB — OB RESULTS CONSOLE ABO/RH: RH Type: POSITIVE

## 2015-03-20 LAB — OB RESULTS CONSOLE HEPATITIS B SURFACE ANTIGEN: Hepatitis B Surface Ag: NEGATIVE

## 2015-03-20 LAB — OB RESULTS CONSOLE HIV ANTIBODY (ROUTINE TESTING): HIV: NONREACTIVE

## 2015-03-20 LAB — OB RESULTS CONSOLE ANTIBODY SCREEN: ANTIBODY SCREEN: NEGATIVE

## 2015-03-20 LAB — OB RESULTS CONSOLE RPR: RPR: NONREACTIVE

## 2015-03-20 LAB — OB RESULTS CONSOLE GC/CHLAMYDIA
Chlamydia: NEGATIVE
Gonorrhea: NEGATIVE

## 2015-03-20 LAB — OB RESULTS CONSOLE RUBELLA ANTIBODY, IGM: RUBELLA: IMMUNE

## 2015-06-24 NOTE — L&D Delivery Note (Signed)
Patient presented to triage this am reporting SROM and contractions starting at 0600. She was evaluated and felt to not be ruptured and was 1 to 2 cm dilated.  After 1 hour of observation, she was 3 to 4 cm dilated per triage nurse.  I was informed and order was given at that time to Admit. Shortly after that, her husband reports that patient felt like she had to push while the IV was being inserted. She was checked at 0900 and found to be rim. That was 45 minutes after last exam.  She subsequently delivered the midwife before I arrived.  When I arrived, Baby was doing skin to skin on the chset  I examined patient. Placenta was then delivered spontaneously with 3 vessel cord. Intact  EBL 400 cc  First degree laceration repaired with 3.0 Vicryl  Mom and Baby doing well.

## 2015-10-06 ENCOUNTER — Inpatient Hospital Stay (HOSPITAL_COMMUNITY)
Admission: AD | Admit: 2015-10-06 | Discharge: 2015-10-07 | DRG: 775 | Disposition: A | Discharge status: Home / Self Care | Payer: BLUE CROSS/BLUE SHIELD | Source: Ambulatory Visit | Attending: Obstetrics and Gynecology | Admitting: Obstetrics and Gynecology

## 2015-10-06 ENCOUNTER — Encounter (HOSPITAL_COMMUNITY): Payer: Self-pay | Admitting: *Deleted

## 2015-10-06 DIAGNOSIS — Z3A38 38 weeks gestation of pregnancy: Secondary | ICD-10-CM

## 2015-10-06 DIAGNOSIS — O4202 Full-term premature rupture of membranes, onset of labor within 24 hours of rupture: Secondary | ICD-10-CM | POA: Diagnosis present

## 2015-10-06 DIAGNOSIS — Z880 Allergy status to penicillin: Secondary | ICD-10-CM

## 2015-10-06 LAB — CBC
HCT: 34.6 % — ABNORMAL LOW (ref 36.0–46.0)
Hemoglobin: 12 g/dL (ref 12.0–15.0)
MCH: 29.1 pg (ref 26.0–34.0)
MCHC: 34.7 g/dL (ref 30.0–36.0)
MCV: 84 fL (ref 78.0–100.0)
PLATELETS: 252 10*3/uL (ref 150–400)
RBC: 4.12 MIL/uL (ref 3.87–5.11)
RDW: 12.7 % (ref 11.5–15.5)
WBC: 12.9 10*3/uL — ABNORMAL HIGH (ref 4.0–10.5)

## 2015-10-06 LAB — TYPE AND SCREEN
ABO/RH(D): O POS
Antibody Screen: NEGATIVE

## 2015-10-06 LAB — ABO/RH: ABO/RH(D): O POS

## 2015-10-06 MED ORDER — IBUPROFEN 600 MG PO TABS
600.0000 mg | ORAL_TABLET | Freq: Four times a day (QID) | ORAL | Status: DC
  Administered 2015-10-06 – 2015-10-07 (×5): 600 mg via ORAL
  Filled 2015-10-06 (×5): qty 1

## 2015-10-06 MED ORDER — BISACODYL 10 MG RE SUPP: 10.0000 mg | Freq: Every day | RECTAL | Status: DC | PRN

## 2015-10-06 MED ORDER — BENZOCAINE-MENTHOL 20-0.5 % EX AERO: 1.0000 "application " | INHALATION_SPRAY | CUTANEOUS | Status: DC | PRN

## 2015-10-06 MED ORDER — DIBUCAINE 1 % RE OINT: 1.0000 "application " | TOPICAL_OINTMENT | RECTAL | Status: DC | PRN

## 2015-10-06 MED ORDER — LACTATED RINGERS IV SOLN: INTRAVENOUS | Status: DC

## 2015-10-06 MED ORDER — MEDROXYPROGESTERONE ACETATE 150 MG/ML IM SUSP: 150.0000 mg | INTRAMUSCULAR | Status: DC | PRN

## 2015-10-06 MED ORDER — CITRIC ACID-SODIUM CITRATE 334-500 MG/5ML PO SOLN: 30.0000 mL | ORAL | Status: DC | PRN

## 2015-10-06 MED ORDER — DIPHENHYDRAMINE HCL 25 MG PO CAPS: 25.0000 mg | ORAL_CAPSULE | Freq: Four times a day (QID) | ORAL | Status: DC | PRN

## 2015-10-06 MED ORDER — OXYCODONE-ACETAMINOPHEN 5-325 MG PO TABS: 1.0000 | ORAL_TABLET | ORAL | Status: DC | PRN

## 2015-10-06 MED ORDER — PRENATAL MULTIVITAMIN CH
1.0000 | ORAL_TABLET | Freq: Every day | ORAL | Status: DC
  Administered 2015-10-06 – 2015-10-07 (×2): 1 via ORAL
  Filled 2015-10-06 (×2): qty 1

## 2015-10-06 MED ORDER — MEASLES, MUMPS & RUBELLA VAC ~~LOC~~ INJ
0.5000 mL | INJECTION | Freq: Once | SUBCUTANEOUS | Status: DC
  Filled 2015-10-06: qty 0.5

## 2015-10-06 MED ORDER — FLEET ENEMA 7-19 GM/118ML RE ENEM
1.0000 | ENEMA | Freq: Every day | RECTAL | Status: DC | PRN
Start: 2015-10-06 — End: 2015-10-07

## 2015-10-06 MED ORDER — OXYTOCIN 10 UNIT/ML IJ SOLN: 2.5000 [IU]/h | INTRAVENOUS | Status: DC

## 2015-10-06 MED ORDER — ACETAMINOPHEN 325 MG PO TABS: 650.0000 mg | ORAL_TABLET | ORAL | Status: DC | PRN

## 2015-10-06 MED ORDER — LIDOCAINE HCL (PF) 1 % IJ SOLN: 30.0000 mL | INTRAMUSCULAR | Status: DC | PRN

## 2015-10-06 MED ORDER — OXYTOCIN 10 UNIT/ML IJ SOLN
INTRAMUSCULAR | Status: AC
  Administered 2015-10-06: 20 [IU]
  Filled 2015-10-06: qty 2

## 2015-10-06 MED ORDER — SENNOSIDES-DOCUSATE SODIUM 8.6-50 MG PO TABS
2.0000 | ORAL_TABLET | ORAL | Status: DC
  Administered 2015-10-07: 2 via ORAL
  Filled 2015-10-06: qty 2

## 2015-10-06 MED ORDER — TETANUS-DIPHTH-ACELL PERTUSSIS 5-2.5-18.5 LF-MCG/0.5 IM SUSP: 0.5000 mL | Freq: Once | INTRAMUSCULAR | Status: DC

## 2015-10-06 MED ORDER — ONDANSETRON HCL 4 MG PO TABS: 4.0000 mg | ORAL_TABLET | ORAL | Status: DC | PRN

## 2015-10-06 MED ORDER — OXYCODONE-ACETAMINOPHEN 5-325 MG PO TABS: 2.0000 | ORAL_TABLET | ORAL | Status: DC | PRN

## 2015-10-06 MED ORDER — SIMETHICONE 80 MG PO CHEW
80.0000 mg | CHEWABLE_TABLET | ORAL | Status: DC | PRN
Start: 2015-10-06 — End: 2015-10-07

## 2015-10-06 MED ORDER — LACTATED RINGERS IV SOLN: 500.0000 mL | INTRAVENOUS | Status: DC | PRN

## 2015-10-06 MED ORDER — ONDANSETRON HCL 4 MG/2ML IJ SOLN: 4.0000 mg | Freq: Four times a day (QID) | INTRAMUSCULAR | Status: DC | PRN

## 2015-10-06 MED ORDER — ZOLPIDEM TARTRATE 5 MG PO TABS: 5.0000 mg | ORAL_TABLET | Freq: Every evening | ORAL | Status: DC | PRN

## 2015-10-06 MED ORDER — ONDANSETRON HCL 4 MG/2ML IJ SOLN: 4.0000 mg | INTRAMUSCULAR | Status: DC | PRN

## 2015-10-06 MED ORDER — OXYTOCIN BOLUS FROM INFUSION: 500.0000 mL | INTRAVENOUS | Status: DC

## 2015-10-06 MED ORDER — WITCH HAZEL-GLYCERIN EX PADS: 1.0000 "application " | MEDICATED_PAD | CUTANEOUS | Status: DC | PRN

## 2015-10-06 MED ORDER — LIDOCAINE HCL (PF) 1 % IJ SOLN
INTRAMUSCULAR | Status: AC
Start: 2015-10-06 — End: 2015-10-06
  Administered 2015-10-06: 30 mL
  Filled 2015-10-06: qty 30

## 2015-10-06 MED ORDER — COCONUT OIL OIL: 1.0000 "application " | TOPICAL_OIL | Status: DC | PRN

## 2015-10-06 NOTE — H&P (Signed)
Allison LarryLaura Fischer is a 30 y.o. G 1 P 0 at 38 w 3 days presented in active labor. Subsequently delivered in triage. Maternal Medical History:  Reason for admission: Rupture of membranes and contractions.     OB History    Gravida Para Term Preterm AB TAB SAB Ectopic Multiple Living   1              Past Medical History  Diagnosis Date  . Allergy   . Kidney stones    Past Surgical History  Procedure Laterality Date  . Eye surgery     Family History: family history includes Cancer in her maternal grandmother; Heart attack in her maternal grandfather and paternal grandfather; Hypertension in her maternal grandmother. Social History:  reports that she has never smoked. She has never used smokeless tobacco. She reports that she drinks alcohol. She reports that she does not use illicit drugs.   Prenatal Transfer Tool  Maternal Diabetes: No Genetic Screening: Normal Maternal Ultrasounds/Referrals: Normal Fetal Ultrasounds or other Referrals:  None Maternal Substance Abuse:  No Significant Maternal Medications:  None Significant Maternal Lab Results:  None Other Comments:  None  Review of Systems  All other systems reviewed and are negative.   Dilation: 3.5 Effacement (%): 90 Station: -2 Exam by:: AAndria Meuse. Stevens, RN Blood pressure 117/80, pulse 94, temperature 97.5 F (36.4 C), resp. rate 18, height 5' 2.5" (1.588 m), weight 84.46 kg (186 lb 3.2 oz). Exam Physical Exam  Prenatal labs: ABO, Rh: O/Positive/-- (09/27 0000) Antibody: Negative (09/27 0000) Rubella: Immune (09/27 0000) RPR: Nonreactive (09/27 0000)  HBsAg: Negative (09/27 0000)  HIV: Non-reactive (09/27 0000)  GBS:     Assessment/Plan: NSVD   Arvine Clayburn L 10/06/2015, 9:46 AM

## 2015-10-06 NOTE — Lactation Note (Addendum)
This note was copied from a baby's chart. Lactation Consultation Note  Patient Name: Allison Fischer [id number]'s Date: 10/06/2015 Reason for consult: Initial assessment  Baby 7 hours old. Mom has baby at breast and reports that baby has been nursing off-and-on for about an hour. Demonstrated to mom how to support baby's head so he can maintain a deep latch. However, mom is trying to eat a meal herself and nurse with one arm. Also, mom doesn't want to reposition baby right now because she wants him to stay warm so he can get a bath. Enc mom to ask for assistance with latching after the bath as needed. Enc mom to continue to nurse with cues and offer lots of STS. Answered mom's questions about nursing/pumping and returning to work. Mom given [facility] brochure, aware of OP/BFSG and LC phone line assistance after D/C.  Maternal Data Has patient been taught Hand Expression?: Yes Does the patient have breastfeeding experience prior to this delivery?: No  Feeding Feeding Type: Breast Fed Length of feed: 10 min  LATCH Score/Interventions Latch: Grasps breast easily, tongue down, lips flanged, rhythmical sucking. Intervention(s): Adjust position;Assist with latch;Breast compression  Audible Swallowing: A few with stimulation Intervention(s): Skin to skin  Type of Nipple: Everted at rest and after stimulation  Comfort (Breast/Nipple): Soft / non-tender     Hold (Positioning): Assistance needed to correctly position infant at breast and maintain latch.  LATCH Score: 8  Lactation Tools Discussed/Used     Consult Status Consult Status: Follow-up Date: 10/07/15 Follow-up type: In-patient    Geralynn OchsWILLIARD, [name] 10/06/2015, 4:40 PM

## 2015-10-06 NOTE — MAU Note (Signed)
Pt went to BR before changing clothes

## 2015-10-06 NOTE — MAU Note (Addendum)
SROM about 0515. Contractions started soon afterward. Clear fld

## 2015-10-06 NOTE — MAU Provider Note (Signed)
Chief Complaint:  Rupture of Membranes and Contractions   First Provider Initiated Contact with Patient 10/06/15 0715     HPI   Allison Fischer is a 30 y.o. G1P0 at 59w3dwho presents to maternity admissions reporting contractions and a single gush of fluid. States it was mostly mucous.  Some bleeding.  Contractions are very strong. She reports good fetal movement, denies LOF, vaginal bleeding, vaginal itching/burning, urinary symptoms, h/a, dizziness, n/v, diarrhea, or fever/chills.  She denies headache, visual changes or RUQ abdominal pain.  RN Note:  SROM about 0515. Contractions started soon afterward. Clear fld          Past Medical History: Past Medical History  Diagnosis Date  . Allergy   . Kidney stones     Past obstetric history: OB History  Gravida Para Term Preterm AB SAB TAB Ectopic Multiple Living  1             # Outcome Date GA Lbr Len/2nd Weight Sex Delivery Anes PTL Lv  1 Current               Past Surgical History: Past Surgical History  Procedure Laterality Date  . Eye surgery      Family History: Family History  Problem Relation Age of Onset  . Hypertension Maternal Grandmother   . Cancer Maternal Grandmother   . Heart attack Maternal Grandfather   . Heart attack Paternal Grandfather     Social History: Social History  Substance Use Topics  . Smoking status: Never Smoker   . Smokeless tobacco: Never Used  . Alcohol Use: 0.0 oz/week    0 Standard drinks or equivalent per week    Allergies:  Allergies  Allergen Reactions  . Penicillins Rash    Has patient had a PCN reaction causing immediate rash, facial/tongue/throat swelling, SOB or lightheadedness with hypotension: Yes Has patient had a PCN reaction causing severe rash involving mucus membranes or skin necrosis: No Has patient had a PCN reaction that required hospitalization No Has patient had a PCN reaction occurring within the last 10 years: No If all of the above answers are "NO",  then may proceed with Cephalosporin use.     Meds:  Prescriptions prior to admission  Medication Sig Dispense Refill Last Dose  . bisacodyl (DULCOLAX) 5 MG EC tablet Take 5 mg by mouth daily as needed for moderate constipation.   10/06/2015 at Unknown time  . pantoprazole (PROTONIX) 20 MG tablet Take 20 mg by mouth daily.  12 Past Week at Unknown time  . Prenatal Vit-Fe Fumarate-FA (PRENATAL MULTIVITAMIN) TABS tablet Take 1 tablet by mouth daily at 12 noon.    Past Week at Unknown time  . drospirenone-ethinyl estradiol (YAZ,GIANVI,LORYNA) 3-0.02 MG tablet Take 1 tablet by mouth daily. 3 Package 4 Taking  . Multiple Vitamin (MULTIVITAMIN WITH MINERALS) TABS tablet Take 1 tablet by mouth daily.   Taking    I have reviewed patient's Past Medical Hx, Surgical Hx, Family Hx, Social Hx, medications and allergies.   ROS:  Review of Systems  Constitutional: Negative for fever, chills and fatigue.  Respiratory: Negative for shortness of breath.   Gastrointestinal: Positive for abdominal pain and constipation. Negative for nausea, vomiting and diarrhea.  Genitourinary: Positive for pelvic pain. Negative for dysuria, vaginal bleeding and vaginal discharge.  Musculoskeletal: Negative for back pain.   Other systems negative  Physical Exam  Patient Vitals for the past 24 hrs:  BP Temp Pulse Resp Height Weight  10/06/15 0624 117/80 mmHg  97.5 F (36.4 C) 94 18 5' 2.5" (1.588 m) 84.46 kg (186 lb 3.2 oz)   Constitutional: Well-developed, well-nourished female in no acute distress.  Cardiovascular: normal rate and rhythm Respiratory: normal effort, clear to auscultation bilaterally GI: Abd soft, non-tender, gravid appropriate for gestational age.   No rebound or guarding. MS: Extremities nontender, no edema, normal ROM Neurologic: Alert and oriented x 4.  GU: Neg CVAT.  PELVIC EXAM: Cervix pink, visually closed, without lesion, scant mucous discharge, vaginal walls and external genitalia  normal Bimanual exam: Cervix soft, midposition, neg CMT, uterus nontender, Fundal Height consistent with dates, adnexa without tenderness, enlargement, or mass  Dilation: 1.5 Effacement (%): 90 Exam by:: Wynelle BourgeoisMarie Amaad Byers CNM  FHT:  Baseline 140 , moderate variability, accelerations present, no decelerations Contractions: Irregular     Labs: No results found for this or any previous visit (from the past 24 hour(s)). O/Positive/-- (09/27 0000)  Imaging:  No results found.  MAU Course/MDM: Sterile speculum exam done:  NO pooling or ferning.  NST reviewed RN will Consult Dr Vincente PoliGrewal with presentation, exam findings and test results.    Assessment: SIUP at 1064w3d  Contractions, latent vs prodromal labor No evidence of ruptured membranes  Plan: Patient will walk for an hour and then get rechecked    Medication List    ASK your doctor about these medications        bisacodyl 5 MG EC tablet  Commonly known as:  DULCOLAX  Take 5 mg by mouth daily as needed for moderate constipation.     drospirenone-ethinyl estradiol 3-0.02 MG tablet  Commonly known as:  YAZ,GIANVI,LORYNA  Take 1 tablet by mouth daily.     multivitamin with minerals Tabs tablet  Take 1 tablet by mouth daily.     pantoprazole 20 MG tablet  Commonly known as:  PROTONIX  Take 20 mg by mouth daily.     prenatal multivitamin Tabs tablet  Take 1 tablet by mouth daily at 12 noon.       Pt stable at time of discharge.  Wynelle BourgeoisMarie Blake Vetrano CNM, MSN Certified Nurse-Midwife 10/06/2015 7:24 AM

## 2015-10-07 LAB — CBC
HEMATOCRIT: 31.5 % — AB (ref 36.0–46.0)
Hemoglobin: 10.7 g/dL — ABNORMAL LOW (ref 12.0–15.0)
MCH: 28.9 pg (ref 26.0–34.0)
MCHC: 34 g/dL (ref 30.0–36.0)
MCV: 85.1 fL (ref 78.0–100.0)
PLATELETS: 243 10*3/uL (ref 150–400)
RBC: 3.7 MIL/uL — AB (ref 3.87–5.11)
RDW: 13.2 % (ref 11.5–15.5)
WBC: 11.8 10*3/uL — AB (ref 4.0–10.5)

## 2015-10-07 LAB — RPR: RPR Ser Ql: NONREACTIVE

## 2015-10-07 MED ORDER — IBUPROFEN 600 MG PO TABS: 600.0000 mg | ORAL_TABLET | Freq: Four times a day (QID) | ORAL | Status: DC

## 2015-10-07 NOTE — Discharge Summary (Signed)
Obstetric Discharge Summary Reason for Admission: onset of labor Prenatal Procedures: none Intrapartum Procedures: spontaneous vaginal delivery Postpartum Procedures: none Complications-Operative and Postpartum: first degree perineal laceration HEMOGLOBIN  Date Value Ref Range Status  10/07/2015 10.7* 12.0 - 15.0 g/dL Final  65/78/469603/03/2014 29.514.2 12.2 - 16.2 g/dL Final   HCT  Date Value Ref Range Status  10/07/2015 31.5* 36.0 - 46.0 % Final   HCT, POC  Date Value Ref Range Status  08/30/2013 44.0 37.7 - 47.9 % Final    Physical Exam:  General: alert, cooperative and appears stated age 36Lochia: appropriate Uterine Fundus: firm Incision: healing well, no significant drainage, no dehiscence DVT Evaluation: No evidence of DVT seen on physical exam.  Discharge Diagnoses: Term Pregnancy-delivered  Discharge Information: Date: 10/07/2015 Activity: pelvic rest Diet: routine Medications: Ibuprofen Condition: improved Instructions: refer to practice specific booklet Discharge to: home   Newborn Data: Live born female  Birth Weight: 7 lb 1.6 oz (3220 g) APGAR: 7, 9  Home with mother.  Maiya Kates L 10/07/2015, 10:25 AM

## 2017-04-21 ENCOUNTER — Observation Stay (HOSPITAL_COMMUNITY)
Admission: EM | Admit: 2017-04-21 | Discharge: 2017-04-22 | Disposition: A | Discharge status: Home / Self Care | Payer: BLUE CROSS/BLUE SHIELD | Attending: General Surgery | Admitting: General Surgery

## 2017-04-21 ENCOUNTER — Encounter (HOSPITAL_COMMUNITY)
Admission: EM | Disposition: A | Discharge status: Home / Self Care | Payer: Self-pay | Source: Home / Self Care | Attending: Physician Assistant

## 2017-04-21 ENCOUNTER — Emergency Department (HOSPITAL_COMMUNITY): Payer: BLUE CROSS/BLUE SHIELD

## 2017-04-21 ENCOUNTER — Emergency Department (HOSPITAL_COMMUNITY): Payer: BLUE CROSS/BLUE SHIELD | Admitting: Certified Registered Nurse Anesthetist

## 2017-04-21 ENCOUNTER — Encounter (HOSPITAL_COMMUNITY): Payer: Self-pay

## 2017-04-21 DIAGNOSIS — Z880 Allergy status to penicillin: Secondary | ICD-10-CM | POA: Diagnosis not present

## 2017-04-21 DIAGNOSIS — K3533 Acute appendicitis with perforation and localized peritonitis, with abscess: Secondary | ICD-10-CM | POA: Diagnosis not present

## 2017-04-21 DIAGNOSIS — K3589 Other acute appendicitis without perforation or gangrene: Secondary | ICD-10-CM | POA: Diagnosis present

## 2017-04-21 DIAGNOSIS — Z79899 Other long term (current) drug therapy: Secondary | ICD-10-CM | POA: Diagnosis not present

## 2017-04-21 DIAGNOSIS — Z87442 Personal history of urinary calculi: Secondary | ICD-10-CM | POA: Diagnosis not present

## 2017-04-21 DIAGNOSIS — K3580 Unspecified acute appendicitis: Secondary | ICD-10-CM

## 2017-04-21 HISTORY — PX: LAPAROSCOPIC APPENDECTOMY: SHX408

## 2017-04-21 LAB — I-STAT CG4 LACTIC ACID, ED
LACTIC ACID, VENOUS: 2.94 mmol/L — AB (ref 0.5–1.9)
Lactic Acid, Venous: 2.08 mmol/L (ref 0.5–1.9)

## 2017-04-21 LAB — CBC
HEMATOCRIT: 38 % (ref 36.0–46.0)
HEMOGLOBIN: 12.6 g/dL (ref 12.0–15.0)
MCH: 28.4 pg (ref 26.0–34.0)
MCHC: 33.2 g/dL (ref 30.0–36.0)
MCV: 85.8 fL (ref 78.0–100.0)
Platelets: 280 10*3/uL (ref 150–400)
RBC: 4.43 MIL/uL (ref 3.87–5.11)
RDW: 12.1 % (ref 11.5–15.5)
WBC: 14.7 10*3/uL — AB (ref 4.0–10.5)

## 2017-04-21 LAB — LIPASE, BLOOD: Lipase: 19 U/L (ref 11–51)

## 2017-04-21 LAB — COMPREHENSIVE METABOLIC PANEL
ALBUMIN: 3.7 g/dL (ref 3.5–5.0)
ALT: 15 U/L (ref 14–54)
ANION GAP: 11 (ref 5–15)
AST: 19 U/L (ref 15–41)
Alkaline Phosphatase: 48 U/L (ref 38–126)
BILIRUBIN TOTAL: 0.6 mg/dL (ref 0.3–1.2)
BUN: 11 mg/dL (ref 6–20)
CO2: 22 mmol/L (ref 22–32)
Calcium: 9 mg/dL (ref 8.9–10.3)
Chloride: 102 mmol/L (ref 101–111)
Creatinine, Ser: 0.7 mg/dL (ref 0.44–1.00)
GFR calc Af Amer: >60 mL/min (ref >60)
GFR calc non Af Amer: >60 mL/min (ref >60)
GLUCOSE: 146 mg/dL — AB (ref 65–99)
POTASSIUM: 4 mmol/L (ref 3.5–5.1)
Sodium: 135 mmol/L (ref 135–145)
Total Protein: 6.6 g/dL (ref 6.5–8.1)

## 2017-04-21 LAB — URINALYSIS, ROUTINE W REFLEX MICROSCOPIC
BILIRUBIN URINE: NEGATIVE
Glucose, UA: NEGATIVE mg/dL
Hgb urine dipstick: NEGATIVE
KETONES UR: 20 mg/dL — AB
LEUKOCYTES UA: NEGATIVE
NITRITE: NEGATIVE
Protein, ur: NEGATIVE mg/dL
Specific Gravity, Urine: 1.026 (ref 1.005–1.030)
pH: 6 (ref 5.0–8.0)

## 2017-04-21 LAB — I-STAT BETA HCG BLOOD, ED (MC, WL, AP ONLY): I-stat hCG, quantitative: <5 m[IU]/mL (ref <5)

## 2017-04-21 SURGERY — APPENDECTOMY, LAPAROSCOPIC
Anesthesia: General | Site: Abdomen

## 2017-04-21 MED ORDER — INFLUENZA VAC SPLIT QUAD 0.5 ML IM SUSY: 0.5000 mL | PREFILLED_SYRINGE | INTRAMUSCULAR | Status: DC

## 2017-04-21 MED ORDER — CIPROFLOXACIN IN D5W 400 MG/200ML IV SOLN
400.0000 mg | Freq: Once | INTRAVENOUS | Status: AC
  Administered 2017-04-21: 400 mg via INTRAVENOUS
  Filled 2017-04-21: qty 200

## 2017-04-21 MED ORDER — ONDANSETRON 4 MG PO TBDP
ORAL_TABLET | ORAL | Status: AC
  Filled 2017-04-21: qty 1

## 2017-04-21 MED ORDER — SODIUM CHLORIDE 0.9 % IV BOLUS (SEPSIS)
1000.0000 mL | Freq: Once | INTRAVENOUS | Status: AC
  Administered 2017-04-21: 1000 mL via INTRAVENOUS

## 2017-04-21 MED ORDER — DEXAMETHASONE SODIUM PHOSPHATE 10 MG/ML IJ SOLN
INTRAMUSCULAR | Status: DC | PRN
  Administered 2017-04-21: 10 mg via INTRAVENOUS

## 2017-04-21 MED ORDER — ONDANSETRON HCL 4 MG/2ML IJ SOLN: 4.0000 mg | Freq: Four times a day (QID) | INTRAMUSCULAR | Status: DC | PRN

## 2017-04-21 MED ORDER — HYDROMORPHONE HCL 1 MG/ML IJ SOLN
0.2500 mg | INTRAMUSCULAR | Status: DC | PRN
  Administered 2017-04-21 (×2): 0.25 mg via INTRAVENOUS

## 2017-04-21 MED ORDER — OXYCODONE-ACETAMINOPHEN 5-325 MG PO TABS
1.0000 | ORAL_TABLET | ORAL | Status: DC | PRN
Start: 2017-04-21 — End: 2017-04-21
  Administered 2017-04-21: 1 via ORAL
  Filled 2017-04-21: qty 1

## 2017-04-21 MED ORDER — EPHEDRINE 5 MG/ML INJ
INTRAVENOUS | Status: AC
  Filled 2017-04-21: qty 20

## 2017-04-21 MED ORDER — SUGAMMADEX SODIUM 500 MG/5ML IV SOLN
INTRAVENOUS | Status: AC
  Filled 2017-04-21: qty 5

## 2017-04-21 MED ORDER — SUCCINYLCHOLINE CHLORIDE 20 MG/ML IJ SOLN
INTRAMUSCULAR | Status: DC | PRN
  Administered 2017-04-21: 100 mg via INTRAVENOUS

## 2017-04-21 MED ORDER — SUCCINYLCHOLINE CHLORIDE 200 MG/10ML IV SOSY
PREFILLED_SYRINGE | INTRAVENOUS | Status: AC
  Filled 2017-04-21: qty 10

## 2017-04-21 MED ORDER — LIDOCAINE 2% (20 MG/ML) 5 ML SYRINGE
INTRAMUSCULAR | Status: AC
  Filled 2017-04-21: qty 20

## 2017-04-21 MED ORDER — HYDRALAZINE HCL 20 MG/ML IJ SOLN: 10.0000 mg | INTRAMUSCULAR | Status: DC | PRN

## 2017-04-21 MED ORDER — MIDAZOLAM HCL 2 MG/2ML IJ SOLN
INTRAMUSCULAR | Status: AC
  Filled 2017-04-21: qty 2

## 2017-04-21 MED ORDER — HYDROMORPHONE HCL 1 MG/ML IJ SOLN
INTRAMUSCULAR | Status: AC
  Filled 2017-04-21: qty 1

## 2017-04-21 MED ORDER — ONDANSETRON 4 MG PO TBDP
4.0000 mg | ORAL_TABLET | Freq: Once | ORAL | Status: AC | PRN
  Administered 2017-04-21: 4 mg via ORAL

## 2017-04-21 MED ORDER — PROPOFOL 10 MG/ML IV BOLUS
INTRAVENOUS | Status: DC | PRN
  Administered 2017-04-21: 150 mg via INTRAVENOUS

## 2017-04-21 MED ORDER — OXYCODONE-ACETAMINOPHEN 5-325 MG PO TABS
ORAL_TABLET | ORAL | Status: AC
  Filled 2017-04-21: qty 1

## 2017-04-21 MED ORDER — BUPIVACAINE-EPINEPHRINE 0.25% -1:200000 IJ SOLN
INTRAMUSCULAR | Status: DC | PRN
  Administered 2017-04-21: 20 mL

## 2017-04-21 MED ORDER — DIPHENHYDRAMINE HCL 50 MG/ML IJ SOLN
INTRAMUSCULAR | Status: DC | PRN
  Administered 2017-04-21: 50 mg via INTRAVENOUS

## 2017-04-21 MED ORDER — DIPHENHYDRAMINE HCL 50 MG/ML IJ SOLN: 25.0000 mg | Freq: Four times a day (QID) | INTRAMUSCULAR | Status: DC | PRN

## 2017-04-21 MED ORDER — ONDANSETRON 4 MG PO TBDP: 4.0000 mg | ORAL_TABLET | Freq: Four times a day (QID) | ORAL | Status: DC | PRN

## 2017-04-21 MED ORDER — MORPHINE SULFATE (PF) 4 MG/ML IV SOLN: 2.0000 mg | INTRAVENOUS | Status: DC | PRN

## 2017-04-21 MED ORDER — BUPIVACAINE-EPINEPHRINE (PF) 0.25% -1:200000 IJ SOLN
INTRAMUSCULAR | Status: AC
  Filled 2017-04-21: qty 30

## 2017-04-21 MED ORDER — ONDANSETRON 4 MG PO TBDP
4.0000 mg | ORAL_TABLET | Freq: Once | ORAL | Status: AC
  Administered 2017-04-21: 4 mg via ORAL

## 2017-04-21 MED ORDER — SUGAMMADEX SODIUM 200 MG/2ML IV SOLN
INTRAVENOUS | Status: AC
  Filled 2017-04-21: qty 2

## 2017-04-21 MED ORDER — SODIUM CHLORIDE 0.9 % IR SOLN
Status: DC | PRN
  Administered 2017-04-21: 1000 mL

## 2017-04-21 MED ORDER — PROPOFOL 10 MG/ML IV BOLUS
INTRAVENOUS | Status: AC
  Filled 2017-04-21: qty 20

## 2017-04-21 MED ORDER — MORPHINE SULFATE (PF) 4 MG/ML IV SOLN
4.0000 mg | INTRAVENOUS | Status: AC
  Administered 2017-04-21: 4 mg via INTRAVENOUS
  Filled 2017-04-21: qty 1

## 2017-04-21 MED ORDER — ONDANSETRON HCL 4 MG/2ML IJ SOLN
4.0000 mg | Freq: Once | INTRAMUSCULAR | Status: AC
  Administered 2017-04-21: 4 mg via INTRAVENOUS
  Filled 2017-04-21: qty 2

## 2017-04-21 MED ORDER — 0.9 % SODIUM CHLORIDE (POUR BTL) OPTIME
TOPICAL | Status: DC | PRN
  Administered 2017-04-21: 1000 mL

## 2017-04-21 MED ORDER — HYDROCODONE-ACETAMINOPHEN 5-325 MG PO TABS: 1.0000 | ORAL_TABLET | ORAL | Status: DC | PRN

## 2017-04-21 MED ORDER — METOCLOPRAMIDE HCL 5 MG/ML IJ SOLN
INTRAMUSCULAR | Status: AC
  Filled 2017-04-21: qty 2

## 2017-04-21 MED ORDER — LACTATED RINGERS IV SOLN
INTRAVENOUS | Status: DC | PRN
  Administered 2017-04-21: 20:00:00 via INTRAVENOUS

## 2017-04-21 MED ORDER — DEXAMETHASONE SODIUM PHOSPHATE 10 MG/ML IJ SOLN
INTRAMUSCULAR | Status: AC
  Filled 2017-04-21: qty 3

## 2017-04-21 MED ORDER — KETOROLAC TROMETHAMINE 30 MG/ML IJ SOLN: 30.0000 mg | Freq: Four times a day (QID) | INTRAMUSCULAR | Status: DC | PRN

## 2017-04-21 MED ORDER — ONDANSETRON HCL 4 MG/2ML IJ SOLN
INTRAMUSCULAR | Status: AC
  Filled 2017-04-21: qty 8

## 2017-04-21 MED ORDER — PHENYLEPHRINE 40 MCG/ML (10ML) SYRINGE FOR IV PUSH (FOR BLOOD PRESSURE SUPPORT)
PREFILLED_SYRINGE | INTRAVENOUS | Status: AC
  Filled 2017-04-21: qty 20

## 2017-04-21 MED ORDER — SODIUM CHLORIDE 0.9 % IV SOLN
INTRAVENOUS | Status: DC
  Administered 2017-04-21: 23:00:00 via INTRAVENOUS

## 2017-04-21 MED ORDER — CIPROFLOXACIN IN D5W 400 MG/200ML IV SOLN
400.0000 mg | Freq: Once | INTRAVENOUS | Status: DC
Start: 2017-04-21 — End: 2017-04-21

## 2017-04-21 MED ORDER — METRONIDAZOLE IN NACL 5-0.79 MG/ML-% IV SOLN
500.0000 mg | Freq: Once | INTRAVENOUS | Status: AC
  Administered 2017-04-21: 500 mg via INTRAVENOUS
  Filled 2017-04-21: qty 100

## 2017-04-21 MED ORDER — METRONIDAZOLE IN NACL 5-0.79 MG/ML-% IV SOLN: 500.0000 mg | Freq: Once | INTRAVENOUS | Status: DC

## 2017-04-21 MED ORDER — GLYCOPYRROLATE 0.2 MG/ML IJ SOLN
INTRAMUSCULAR | Status: DC | PRN
Start: 2017-04-21 — End: 2017-04-21
  Administered 2017-04-21: 0.6 mg via INTRAVENOUS

## 2017-04-21 MED ORDER — ROCURONIUM BROMIDE 10 MG/ML (PF) SYRINGE
PREFILLED_SYRINGE | INTRAVENOUS | Status: AC
  Filled 2017-04-21: qty 10

## 2017-04-21 MED ORDER — MIDAZOLAM HCL 5 MG/5ML IJ SOLN
INTRAMUSCULAR | Status: DC | PRN
  Administered 2017-04-21: 2 mg via INTRAVENOUS

## 2017-04-21 MED ORDER — DIPHENHYDRAMINE HCL 25 MG PO CAPS: 25.0000 mg | ORAL_CAPSULE | Freq: Four times a day (QID) | ORAL | Status: DC | PRN

## 2017-04-21 MED ORDER — NEOSTIGMINE METHYLSULFATE 10 MG/10ML IV SOLN
INTRAVENOUS | Status: DC | PRN
  Administered 2017-04-21: 4 mg via INTRAVENOUS

## 2017-04-21 MED ORDER — KETOROLAC TROMETHAMINE 30 MG/ML IJ SOLN
30.0000 mg | Freq: Four times a day (QID) | INTRAMUSCULAR | Status: DC
  Administered 2017-04-21 – 2017-04-22 (×2): 30 mg via INTRAVENOUS
  Filled 2017-04-21 (×2): qty 1

## 2017-04-21 MED ORDER — MORPHINE SULFATE (PF) 4 MG/ML IV SOLN
4.0000 mg | Freq: Once | INTRAVENOUS | Status: AC
  Administered 2017-04-21: 4 mg via INTRAVENOUS
  Filled 2017-04-21: qty 1

## 2017-04-21 MED ORDER — LIDOCAINE HCL (CARDIAC) 20 MG/ML IV SOLN
INTRAVENOUS | Status: DC | PRN
  Administered 2017-04-21: 60 mg via INTRAVENOUS

## 2017-04-21 MED ORDER — ROCURONIUM BROMIDE 100 MG/10ML IV SOLN
INTRAVENOUS | Status: DC | PRN
  Administered 2017-04-21: 50 mg via INTRAVENOUS

## 2017-04-21 MED ORDER — FENTANYL CITRATE (PF) 100 MCG/2ML IJ SOLN
INTRAMUSCULAR | Status: DC | PRN
  Administered 2017-04-21 (×3): 50 ug via INTRAVENOUS
  Administered 2017-04-21: 100 ug via INTRAVENOUS

## 2017-04-21 MED ORDER — IOPAMIDOL (ISOVUE-300) INJECTION 61%
INTRAVENOUS | Status: AC
  Administered 2017-04-21: 100 mL
  Filled 2017-04-21: qty 100

## 2017-04-21 MED ORDER — FENTANYL CITRATE (PF) 250 MCG/5ML IJ SOLN
INTRAMUSCULAR | Status: AC
  Filled 2017-04-21: qty 5

## 2017-04-21 SURGICAL SUPPLY — 37 items
APPLIER CLIP 5 13 M/L LIGAMAX5 (MISCELLANEOUS)
CANISTER SUCT 3000ML PPV (MISCELLANEOUS) ×2 IMPLANT
CHLORAPREP W/TINT 26ML (MISCELLANEOUS) ×2 IMPLANT
CLIP APPLIE 5 13 M/L LIGAMAX5 (MISCELLANEOUS) IMPLANT
CLIP VESOLOCK XL 6/CT (CLIP) ×2 IMPLANT
COVER SURGICAL LIGHT HANDLE (MISCELLANEOUS) ×2 IMPLANT
DERMABOND ADHESIVE PROPEN (GAUZE/BANDAGES/DRESSINGS) ×1
DERMABOND ADVANCED (GAUZE/BANDAGES/DRESSINGS) ×1
DERMABOND ADVANCED .7 DNX12 (GAUZE/BANDAGES/DRESSINGS) ×1 IMPLANT
DERMABOND ADVANCED .7 DNX6 (GAUZE/BANDAGES/DRESSINGS) ×1 IMPLANT
DEVICE PMI PUNCTURE CLOSURE (MISCELLANEOUS) IMPLANT
ELECT REM PT RETURN 9FT ADLT (ELECTROSURGICAL) ×2
ELECTRODE REM PT RTRN 9FT ADLT (ELECTROSURGICAL) ×1 IMPLANT
ENDOLOOP SUT PDS II  0 18 (SUTURE)
ENDOLOOP SUT PDS II 0 18 (SUTURE) IMPLANT
GLOVE BIOGEL PI IND STRL 7.0 (GLOVE) ×1 IMPLANT
GLOVE BIOGEL PI INDICATOR 7.0 (GLOVE) ×1
GLOVE SURG SS PI 7.0 STRL IVOR (GLOVE) ×2 IMPLANT
GOWN STRL REUS W/ TWL LRG LVL3 (GOWN DISPOSABLE) ×3 IMPLANT
GOWN STRL REUS W/TWL LRG LVL3 (GOWN DISPOSABLE) ×3
KIT BASIN OR (CUSTOM PROCEDURE TRAY) ×2 IMPLANT
KIT ROOM TURNOVER OR (KITS) ×2 IMPLANT
NS IRRIG 1000ML POUR BTL (IV SOLUTION) ×2 IMPLANT
PAD ARMBOARD 7.5X6 YLW CONV (MISCELLANEOUS) ×4 IMPLANT
POUCH RETRIEVAL ECOSAC 10 (ENDOMECHANICALS) ×1 IMPLANT
POUCH RETRIEVAL ECOSAC 10MM (ENDOMECHANICALS) ×1
SCISSORS LAP 5X35 DISP (ENDOMECHANICALS) ×2 IMPLANT
SET IRRIG TUBING LAPAROSCOPIC (IRRIGATION / IRRIGATOR) ×2 IMPLANT
SPECIMEN JAR SMALL (MISCELLANEOUS) ×2 IMPLANT
SUT MNCRL AB 4-0 PS2 18 (SUTURE) ×2 IMPLANT
TOWEL OR 17X24 6PK STRL BLUE (TOWEL DISPOSABLE) ×2 IMPLANT
TOWEL OR 17X26 10 PK STRL BLUE (TOWEL DISPOSABLE) ×2 IMPLANT
TRAY FOLEY CATH SILVER 16FR (SET/KITS/TRAYS/PACK) IMPLANT
TRAY LAPAROSCOPIC MC (CUSTOM PROCEDURE TRAY) ×2 IMPLANT
TROCAR XCEL NON-BLD 11X100MML (ENDOMECHANICALS) ×2 IMPLANT
TROCAR XCEL NON-BLD 5MMX100MML (ENDOMECHANICALS) ×4 IMPLANT
TUBING INSUFFLATION (TUBING) ×2 IMPLANT

## 2017-04-21 NOTE — ED Notes (Signed)
Pt is difficult stick; attempt x4 by ED RNs, will consult IV team

## 2017-04-21 NOTE — ED Notes (Signed)
Pt to have emergency appendectomy. Significant other pacing hall and complaining about length of wait time. CT called, pt is next for CT

## 2017-04-21 NOTE — ED Notes (Signed)
Patient transported to CT 

## 2017-04-21 NOTE — Anesthesia Postprocedure Evaluation (Signed)
Anesthesia Post Note  Patient: Allison LarryLaura Fischer  Procedure(s) Performed: APPENDECTOMY LAPAROSCOPIC (N/A Abdomen)     Patient location during evaluation: PACU Anesthesia Type: General Level of consciousness: awake and alert Pain management: pain level controlled Vital Signs Assessment: post-procedure vital signs reviewed and stable Respiratory status: spontaneous breathing, nonlabored ventilation and respiratory function stable Cardiovascular status: blood pressure returned to baseline and stable Postop Assessment: no apparent nausea or vomiting Anesthetic complications: no    Last Vitals:  Vitals:   04/21/17 2227 04/21/17 2230  BP: 118/64   Pulse: (!) 111 (!) 104  Resp: 14 17  Temp: (!) 36.3 C   SpO2: 100% 100%    Last Pain:  Vitals:   04/21/17 2227  TempSrc:   PainSc: 2                  Ruthann Angulo,W. EDMOND

## 2017-04-21 NOTE — ED Triage Notes (Signed)
Patient complains of lower abdominal pain with vomiting and diarrhea x 3 hours while at work. Pain worse with movement, denies dysuria

## 2017-04-21 NOTE — Anesthesia Preprocedure Evaluation (Signed)
Anesthesia Evaluation  Patient identified by MRN, date of birth, ID band Patient awake    Reviewed: Allergy & Precautions, H&P , NPO status , Patient's Chart, lab work & pertinent test results  Airway Mallampati: I  TM Distance: >3 FB Neck ROM: Full    Dental no notable dental hx. (+) Teeth Intact, Dental Advisory Given   Pulmonary neg pulmonary ROS,    Pulmonary exam normal breath sounds clear to auscultation       Cardiovascular negative cardio ROS   Rhythm:Regular Rate:Normal     Neuro/Psych negative neurological ROS  negative psych ROS   GI/Hepatic negative GI ROS, Neg liver ROS,   Endo/Other  negative endocrine ROS  Renal/GU negative Renal ROS  negative genitourinary   Musculoskeletal   Abdominal   Peds  Hematology negative hematology ROS (+)   Anesthesia Other Findings   Reproductive/Obstetrics negative OB ROS                             Anesthesia Physical Anesthesia Plan  ASA: I and emergent  Anesthesia Plan: General   Post-op Pain Management:    Induction: Intravenous, Rapid sequence and Cricoid pressure planned  PONV Risk Score and Plan: 4 or greater and Ondansetron, Dexamethasone and Midazolam  Airway Management Planned: Oral ETT  Additional Equipment:   Intra-op Plan:   Post-operative Plan: Extubation in OR  Informed Consent: I have reviewed the patients History and Physical, chart, labs and discussed the procedure including the risks, benefits and alternatives for the proposed anesthesia with the patient or authorized representative who has indicated his/her understanding and acceptance.     Dental advisory given  Plan Discussed with: CRNA  Anesthesia Plan Comments:         Anesthesia Quick Evaluation  

## 2017-04-21 NOTE — Anesthesia Procedure Notes (Signed)
Procedure Name: Intubation Date/Time: 04/21/2017 8:25 PM Performed by: Manuela Schwartz B Pre-anesthesia Checklist: Patient identified, Emergency Drugs available, Suction available and Patient being monitored Patient Re-evaluated:Patient Re-evaluated prior to induction Oxygen Delivery Method: Circle System Utilized Preoxygenation: Pre-oxygenation with 100% oxygen Induction Type: IV induction, Rapid sequence and Cricoid Pressure applied Laryngoscope Size: Mac and 3 Grade View: Grade I Tube type: Oral Tube size: 7.0 mm Number of attempts: 1 Airway Equipment and Method: Stylet and Oral airway Placement Confirmation: ETT inserted through vocal cords under direct vision,  positive ETCO2 and breath sounds checked- equal and bilateral Secured at: 21 cm Tube secured with: Tape Dental Injury: Teeth and Oropharynx as per pre-operative assessment

## 2017-04-21 NOTE — Transfer of Care (Signed)
Immediate Anesthesia Transfer of Care Note  Patient: Allison Fischer  Procedure(s) Performed: APPENDECTOMY LAPAROSCOPIC (N/A Abdomen)  Patient Location: PACU  Anesthesia Type:General  Level of Consciousness: responds to stimulation  Airway & Oxygen Therapy: Patient Spontanous Breathing  Post-op Assessment: Report given to RN and Post -op Vital signs reviewed and stable  Post vital signs: Reviewed and stable  Last Vitals:  Vitals:   04/21/17 1715 04/21/17 2004  BP: 120/71 121/69  Pulse: 97 98  Resp: 20 18  Temp: 37.1 C 37 C  SpO2: 99% 99%    Last Pain:  Vitals:   04/21/17 2004  TempSrc:   PainSc: 4          Complications: No apparent anesthesia complications

## 2017-04-21 NOTE — H&P (Signed)
Allison Fischer is an 31 y.o. female.   Chief Complaint: abdominal pain HPI: 31 yo female with 1 day history of abdominal pain. Pain started periumbilically and now radiates to her right side and around her back. She has nausea and vomited multiple times. She had diarrhea 3 times today. She has never had a pain this severe. Her last meal was 530am.  Past Medical History:  Diagnosis Date  . Allergy   . Kidney stones     Past Surgical History:  Procedure Laterality Date  . EYE SURGERY      Family History  Problem Relation Age of Onset  . Hypertension Maternal Grandmother   . Cancer Maternal Grandmother   . Heart attack Maternal Grandfather   . Heart attack Paternal Grandfather    Social History:  reports that she has never smoked. She has never used smokeless tobacco. She reports that she drinks alcohol. She reports that she does not use drugs.  Allergies:  Allergies  Allergen Reactions  . Penicillins Rash    Has patient had a PCN reaction causing immediate rash, facial/tongue/throat swelling, SOB or lightheadedness with hypotension: Yes Has patient had a PCN reaction causing severe rash involving mucus membranes or skin necrosis: No Has patient had a PCN reaction that required hospitalization No Has patient had a PCN reaction occurring within the last 10 years: No If all of the above answers are "NO", then may proceed with Cephalosporin use.      (Not in a hospital admission)  Results for orders placed or performed during the hospital encounter of 04/21/17 (from the past 48 hour(s))  Lipase, blood     Status: None   Collection Time: 04/21/17 12:58 PM  Result Value Ref Range   Lipase 19 11 - 51 U/L  Comprehensive metabolic panel     Status: Abnormal   Collection Time: 04/21/17 12:58 PM  Result Value Ref Range   Sodium 135 135 - 145 mmol/L   Potassium 4.0 3.5 - 5.1 mmol/L   Chloride 102 101 - 111 mmol/L   CO2 22 22 - 32 mmol/L   Glucose, Bld 146 (H) 65 - 99 mg/dL   BUN 11  6 - 20 mg/dL   Creatinine, Ser 0.70 0.44 - 1.00 mg/dL   Calcium 9.0 8.9 - 10.3 mg/dL   Total Protein 6.6 6.5 - 8.1 g/dL   Albumin 3.7 3.5 - 5.0 g/dL   AST 19 15 - 41 U/L   ALT 15 14 - 54 U/L   Alkaline Phosphatase 48 38 - 126 U/L   Total Bilirubin 0.6 0.3 - 1.2 mg/dL   GFR calc non Af Amer >60 >60 mL/min   GFR calc Af Amer >60 >60 mL/min    Comment: (NOTE) The eGFR has been calculated using the CKD EPI equation. This calculation has not been validated in all clinical situations. eGFR's persistently <60 mL/min signify possible Chronic Kidney Disease.    Anion gap 11 5 - 15  CBC     Status: Abnormal   Collection Time: 04/21/17 12:58 PM  Result Value Ref Range   WBC 14.7 (H) 4.0 - 10.5 K/uL   RBC 4.43 3.87 - 5.11 MIL/uL   Hemoglobin 12.6 12.0 - 15.0 g/dL   HCT 38.0 36.0 - 46.0 %   MCV 85.8 78.0 - 100.0 fL   MCH 28.4 26.0 - 34.0 pg   MCHC 33.2 30.0 - 36.0 g/dL   RDW 12.1 11.5 - 15.5 %   Platelets 280 150 - 400  K/uL  Urinalysis, Routine w reflex microscopic     Status: Abnormal   Collection Time: 04/21/17  1:05 PM  Result Value Ref Range   Color, Urine YELLOW YELLOW   APPearance CLEAR CLEAR   Specific Gravity, Urine 1.026 1.005 - 1.030   pH 6.0 5.0 - 8.0   Glucose, UA NEGATIVE NEGATIVE mg/dL   Hgb urine dipstick NEGATIVE NEGATIVE   Bilirubin Urine NEGATIVE NEGATIVE   Ketones, ur 20 (A) NEGATIVE mg/dL   Protein, ur NEGATIVE NEGATIVE mg/dL   Nitrite NEGATIVE NEGATIVE   Leukocytes, UA NEGATIVE NEGATIVE  I-Stat beta hCG blood, ED     Status: None   Collection Time: 04/21/17  1:31 PM  Result Value Ref Range   I-stat hCG, quantitative <5.0 <5 mIU/mL   Comment 3            Comment:   GEST. AGE      CONC.  (mIU/mL)   <=1 WEEK        5 - 50     2 WEEKS       50 - 500     3 WEEKS       100 - 10,000     4 WEEKS     1,000 - 30,000        FEMALE AND NON-PREGNANT FEMALE:     LESS THAN 5 mIU/mL   I-Stat CG4 Lactic Acid, ED     Status: Abnormal   Collection Time: 04/21/17  4:42 PM   Result Value Ref Range   Lactic Acid, Venous 2.94 (HH) 0.5 - 1.9 mmol/L   Comment NOTIFIED PHYSICIAN    Ct Abdomen Pelvis W Contrast  Result Date: 04/21/2017 CLINICAL DATA:  31 year old female with right lower quadrant abdominal pain. EXAM: CT ABDOMEN AND PELVIS WITH CONTRAST TECHNIQUE: Multidetector CT imaging of the abdomen and pelvis was performed using the standard protocol following bolus administration of intravenous contrast. CONTRAST:  190m ISOVUE-300 IOPAMIDOL (ISOVUE-300) INJECTION 61% COMPARISON:  Abdominal CT dated 04/15/2014 FINDINGS: Lower chest: The visualized lung bases are clear. No intra-abdominal free air or free fluid. Hepatobiliary: No focal liver abnormality is seen. No gallstones, gallbladder wall thickening, or biliary dilatation. Pancreas: Unremarkable. No pancreatic ductal dilatation or surrounding inflammatory changes. Spleen: Normal in size without focal abnormality. Adrenals/Urinary Tract: The adrenal glands are unremarkable. The kidneys, visualized ureters, and urinary bladder appear unremarkable. Stomach/Bowel: There is no evidence of bowel obstruction. The appendix is enlarged and inflamed. The appendix is located in the right hemipelvis posterior to the cecum. There is a small appendicolith at the base of the appendix as well as another appendicolith in the distal lumen of the appendix. There is no drainable fluid collection or abscess or extraluminal air. Vascular/Lymphatic: No significant vascular findings are present. No enlarged abdominal or pelvic lymph nodes. Reproductive: The uterus is anteverted and grossly unremarkable. The ovaries appear unremarkable as well. No pelvic mass. Other: None Musculoskeletal: No acute or significant osseous findings. IMPRESSION: Acute appendicitis.  No abscess or perforation. Electronically Signed   By: AAnner CreteM.D.   On: 04/21/2017 18:42    Review of Systems  Constitutional: Negative for chills and fever.  HENT: Negative  for hearing loss.   Eyes: Negative for blurred vision and double vision.  Respiratory: Negative for cough and hemoptysis.   Cardiovascular: Negative for chest pain and palpitations.  Gastrointestinal: Positive for abdominal pain, nausea and vomiting.  Genitourinary: Negative for dysuria and urgency.  Musculoskeletal: Negative for myalgias and neck pain.  Skin: Negative for itching and rash.  Neurological: Negative for dizziness, tingling and headaches.  Endo/Heme/Allergies: Does not bruise/bleed easily.  Psychiatric/Behavioral: Negative for depression and suicidal ideas.    Blood pressure 120/71, pulse 97, temperature 98.7 F (37.1 C), temperature source Oral, resp. rate 20, SpO2 99 %, unknown if currently breastfeeding. Physical Exam  Vitals reviewed. Constitutional: She is oriented to person, place, and time. She appears well-developed and well-nourished.  HENT:  Head: Normocephalic and atraumatic.  Eyes: Pupils are equal, round, and reactive to light. Conjunctivae and EOM are normal.  Neck: Normal range of motion. Neck supple.  Cardiovascular: Normal rate and regular rhythm.   Respiratory: Effort normal and breath sounds normal.  GI: Soft. Bowel sounds are normal. She exhibits no distension. There is tenderness in the right lower quadrant and suprapubic area. There is guarding.  Musculoskeletal: Normal range of motion.  Neurological: She is alert and oriented to person, place, and time.  Skin: Skin is warm and dry.  Psychiatric: She has a normal mood and affect. Her behavior is normal.     Assessment/Plan 31 yo female with evidence of appendicitis confirmed on ct scan -lap appendectomy -planned observation stay -abx -IVF -pain control  Mickeal Skinner, MD 04/21/2017, 7:22 PM

## 2017-04-21 NOTE — ED Provider Notes (Signed)
[facility] EMERGENCY DEPARTMENT Provider Note   CSN: [id number] Arrival date & time: 04/21/17  1239     History   Chief Complaint Chief Complaint  Patient presents with  . Abdominal Pain  . Emesis    HPI Allison Fischer is a 31 y.o. female.  HPI   Allison Fischer is a 31 y.o. female, with a history of kidney stones, presenting to the ED with abdominal pain beginning around 9:30 AM this morning.  Pain was originally periumbilical, but has since moved towards the right lower quadrant.  Pain is sharp and stabbing, 9/10, accompanied by nonbilious, nonbloody vomiting as well as diarrhea.  Has not experienced this pain before. Last food 5:30 AM this morning.  Denies dizziness, abnormal vaginal discharge, vaginal bleeding, back pain, urinary complaints, fever/chills, hematochezia/melena, or any other complaints.      Past Medical History:  Diagnosis Date  . Allergy   . Kidney stones     Patient Active Problem List   Diagnosis Date Noted  . Acute appendicitis 04/21/2017  . NSVD (normal spontaneous vaginal delivery) 10/06/2015  . Indication for care in labor or delivery 10/06/2015    Past Surgical History:  Procedure Laterality Date  . EYE SURGERY      OB History    Gravida Para Term Preterm AB Living   1 1 1     1    SAB TAB Ectopic Multiple Live Births         0 1       Home Medications    Prior to Admission medications   Medication Sig Start Date End Date Taking? Authorizing Provider  fluocinonide (LIDEX) 0.05 % external solution 1 application once a week. APPLY TO SCALP 01/14/17  Yes [provider]  ketoconazole (NIZORAL) 2 % shampoo 1 application once a week. SCALP 04/16/17  Yes [provider]  Multiple Vitamin (MULTIVITAMIN WITH MINERALS) TABS tablet Take 1 tablet by mouth daily.   Yes [provider]  norethindrone (CAMILA) 0.35 MG tablet Take 1 tablet by mouth daily.   Yes [provider]  ibuprofen  (ADVIL,MOTRIN) 600 MG tablet Take 1 tablet (600 mg total) by mouth every 6 (six) hours. Patient not taking: Reported on 04/21/2017 10/07/15   Marcelle Overlie, MD    Family History Family History  Problem Relation Age of Onset  . Hypertension Maternal Grandmother   . Cancer Maternal Grandmother   . Heart attack Maternal Grandfather   . Heart attack Paternal Grandfather     Social History Social History  Substance Use Topics  . Smoking status: Never Smoker  . Smokeless tobacco: Never Used  . Alcohol use 0.0 oz/week     Allergies   Penicillins and Tape   Review of Systems Review of Systems  Constitutional: Negative for chills and fever.  Respiratory: Negative for shortness of breath.   Cardiovascular: Negative for chest pain.  Gastrointestinal: Positive for abdominal pain, diarrhea, nausea and vomiting. Negative for blood in stool.  Genitourinary: Negative for dysuria, hematuria, vaginal bleeding and vaginal discharge.  Musculoskeletal: Negative for back pain.  Neurological: Negative for dizziness and light-headedness.  All other systems reviewed and are negative.    Physical Exam Updated Vital Signs BP (!) 111/58   Pulse 92   Temp 97.8 F (36.6 C) (Oral)   Resp 18   SpO2 100%   Physical Exam  Constitutional: She appears well-developed and well-nourished. No distress.  HENT:  Head: Normocephalic and atraumatic.  Eyes: Conjunctivae are normal.  Neck: Neck supple.  Cardiovascular: Normal rate, regular rhythm, normal heart sounds and intact distal pulses.   Pulmonary/Chest: Effort normal and breath sounds normal. No respiratory distress.  Abdominal: Soft. There is tenderness in the right lower quadrant and periumbilical area. There is tenderness at McBurney's point. There is no guarding.  Palpation in the periumbilical region radiates pain to the right lower quadrant.  Musculoskeletal: She exhibits no edema.  Lymphadenopathy:    She has no cervical adenopathy.    Neurological: She is alert.  Skin: Skin is warm and dry. She is not diaphoretic.  Psychiatric: She has a normal mood and affect. Her behavior is normal.  Nursing note and vitals reviewed.    ED Treatments / Results  Labs (all labs ordered are listed, but only abnormal results are displayed) Labs Reviewed  COMPREHENSIVE METABOLIC PANEL - Abnormal; Notable for the following:       Result Value   Glucose, Bld 146 (*)    All other components within normal limits  CBC - Abnormal; Notable for the following:    WBC 14.7 (*)    All other components within normal limits  URINALYSIS, ROUTINE W REFLEX MICROSCOPIC - Abnormal; Notable for the following:    Ketones, ur 20 (*)    All other components within normal limits  I-STAT CG4 LACTIC ACID, ED - Abnormal; Notable for the following:    Lactic Acid, Venous 2.94 (*)    All other components within normal limits  I-STAT CG4 LACTIC ACID, ED - Abnormal; Notable for the following:    Lactic Acid, Venous 2.08 (*)    All other components within normal limits  LIPASE, BLOOD  I-STAT BETA HCG BLOOD, ED ([facility], [facility], [facility] ONLY)    EKG  EKG Interpretation None       Radiology Ct Abdomen Pelvis W Contrast  Result Date: 04/21/2017 CLINICAL DATA:  31 year old female with right lower quadrant abdominal pain. EXAM: CT ABDOMEN AND PELVIS WITH CONTRAST TECHNIQUE: Multidetector CT imaging of the abdomen and pelvis was performed using the standard protocol following bolus administration of intravenous contrast. CONTRAST:  100mL ISOVUE-300 IOPAMIDOL (ISOVUE-300) INJECTION 61% COMPARISON:  Abdominal CT dated 04/15/2014 FINDINGS: Lower chest: The visualized lung bases are clear. No intra-abdominal free air or free fluid. Hepatobiliary: No focal liver abnormality is seen. No gallstones, gallbladder wall thickening, or biliary dilatation. Pancreas: Unremarkable. No pancreatic ductal dilatation or surrounding inflammatory changes. Spleen: Normal in size without focal  abnormality. Adrenals/Urinary Tract: The adrenal glands are unremarkable. The kidneys, visualized ureters, and urinary bladder appear unremarkable. Stomach/Bowel: There is no evidence of bowel obstruction. The appendix is enlarged and inflamed. The appendix is located in the right hemipelvis posterior to the cecum. There is a small appendicolith at the base of the appendix as well as another appendicolith in the distal lumen of the appendix. There is no drainable fluid collection or abscess or extraluminal air. Vascular/Lymphatic: No significant vascular findings are present. No enlarged abdominal or pelvic lymph nodes. Reproductive: The uterus is anteverted and grossly unremarkable. The ovaries appear unremarkable as well. No pelvic mass. Other: None Musculoskeletal: No acute or significant osseous findings. IMPRESSION: Acute appendicitis.  No abscess or perforation. Electronically Signed   By: Elgie CollardArash  Radparvar M.D.   On: 04/21/2017 18:42    Procedures Procedures (including critical care time)  Medications Ordered in ED Medications  oxyCODONE-acetaminophen (PERCOCET/ROXICET) 5-325 MG per tablet 1 tablet (1 tablet Oral Given 04/21/17 1258)  ciprofloxacin (CIPRO) IVPB 400 mg (not administered)  And  metroNIDAZOLE (FLAGYL) IVPB 500 mg (not administered)  ondansetron (ZOFRAN-ODT) disintegrating tablet 4 mg (4 mg Oral Given 04/21/17 1258)  ondansetron (ZOFRAN-ODT) disintegrating tablet 4 mg (4 mg Oral Given 04/21/17 1347)  sodium chloride 0.9 % bolus 1,000 mL (0 mLs Intravenous Stopped 04/21/17 1807)  ondansetron (ZOFRAN) injection 4 mg (4 mg Intravenous Given 04/21/17 1634)  morphine 4 MG/ML injection 4 mg (4 mg Intravenous Given 04/21/17 1634)  iopamidol (ISOVUE-300) 61 % injection (100 mLs  Contrast Given 04/21/17 1807)  morphine 4 MG/ML injection 4 mg (4 mg Intravenous Given 04/21/17 1733)     Initial Impression / Assessment and Plan / ED Course  I have reviewed the triage vital signs and  the nursing notes.  Pertinent labs & imaging results that were available during my care of the patient were reviewed by me and considered in my medical decision making (see chart for details).  Clinical Course as of Apr 21 2010  Tue Apr 21, 2017  1530 Patient not yet bedded.  [SJ]  1900 Discussed CT results with patient. Patient's pain 4/10. No change in abdominal exam.   [SJ]  1907 Spoke with Dr. Sheliah Hatch, general surgery, and filled him in on the case.  [SJ]    Clinical Course User Index [SJ] Maxemiliano Riel C, PA-C    Patient presents with abdominal pain.  Acute appendicitis noted on CT.  General surgery consulted and patient admitted through their service.  Findings and plan of care discussed with Bary Castilla, MD.   Vitals:   04/21/17 1253 04/21/17 1715 04/21/17 1715 04/21/17 2004  BP: (!) 111/58  120/71 121/69  Pulse: 92  97 98  Resp: 18  20 18   Temp: 97.8 F (36.6 C)  98.7 F (37.1 C) 98.6 F (37 C)  TempSrc: Oral  Oral   SpO2: 100% 100% 99% 99%     Final Clinical Impressions(s) / ED Diagnoses   Final diagnoses:  Other acute appendicitis    New Prescriptions Discharge Medication List as of 04/21/2017  8:10 PM       Anselm Pancoast, PA-C 04/21/17 2011    Abelino Derrick, MD 04/23/17 1745

## 2017-04-21 NOTE — ED Notes (Signed)
Pain medication not effective, pt continues to moan and cry in pain.

## 2017-04-21 NOTE — Op Note (Signed)
Preoperative diagnosis: acute appendicitis with peritonitis  Postoperative diagnosis: Same   Procedure: laparoscopic appendectomy  Surgeon: Feliciana RossettiLuke Elijiah Mickley, M.D.  Asst: none  Anesthesia: Gen.   Indications for procedure: Allison Fischer is a 31 y.o. female with symptoms of diarrhea, vague pain , pain in right lower quadrant, nausea and vomiting consistent with acute appendicitis. Confirmed by CT scan and laboratory values.  Description of procedure: The patient was brought into the operative suite, placed supine. Anesthesia was administered with endotracheal tube. The patient's left arm was tucked. All pressure points were offloaded by foam padding. The patient was prepped and draped in the usual sterile fashion.  A transverse incision was made to the left of the umbilicus and a 5mm trocar was us. Pneumoperitoneum was applied with high flow low pressure.  2 5mm trocars were placed, one in the suprapubic space, one in the LLQ, the periumbilical incision was then up-sized and a 11mm trocar placed in that space. Pneumoperitoneum was applied with high flow low pressure.  2 5mm trocars were placed, one in the suprapubic space, one in the LLQ. All trocars sites were first anesthesized with 0.25% marcaine with epinephrine. Next the patient was placed in trendelenberg, rotated to the left. The omentum was retracted cephalad. The cecum and appendix were identified. The appendix was retrocecal and adhesions were taken down with cautery. The base of the appendix was dissected and a window through the mesoappendix was created with blunt dissection. Large Hem-o-lock clips were used to doubly ligate the base of the appendix and mesoappendix. The appendix was cut free with scissors.  The appendix was placed in a specimen bag. The pelvis and RLQ were irrigated. Both ovaries were identified and appeared normal. The appendix was removed via the umbilicus. 0 vicryl was used to close the fascial defect. Pneumoperitoneum  was removed, all trocars were removed. All incisions were closed with 4-0 monocryl subcuticular stitch. The patient woke from anesthesia and was brought to PACU in stable condition.  Findings: acute appendicitis, normal uterus and left and right ovaries.  Specimen: appendix  Blood loss: 50 ml  Local anesthesia: 20 ml 0.5% marcaine  Complications: none  Feliciana RossettiLuke Manan Olmo, M.D. General, Bariatric, & Minimally Invasive Surgery [facility]

## 2017-04-21 NOTE — ED Notes (Signed)
I stat lactic acid results given to Dr. Mackuen by B. Deleah Tison, EMT 

## 2017-04-21 NOTE — ED Notes (Signed)
Pt vomiting, cannot keep pain meds down. Pt's significant other angrily asking how long it will be before pt is taken to CT. Morphine did not change pt's pain level

## 2017-04-21 NOTE — ED Notes (Signed)
Family updated on results this far, encouraged family to remain here for eval

## 2017-04-21 NOTE — ED Notes (Signed)
MD bedside to evaluate pt at this time

## 2017-04-22 ENCOUNTER — Encounter (HOSPITAL_COMMUNITY): Payer: Self-pay | Admitting: General Surgery

## 2017-04-22 LAB — CBC
HCT: 35.8 % — ABNORMAL LOW (ref 36.0–46.0)
Hemoglobin: 11.8 g/dL — ABNORMAL LOW (ref 12.0–15.0)
MCH: 28.6 pg (ref 26.0–34.0)
MCHC: 33 g/dL (ref 30.0–36.0)
MCV: 86.9 fL (ref 78.0–100.0)
PLATELETS: 263 10*3/uL (ref 150–400)
RBC: 4.12 MIL/uL (ref 3.87–5.11)
RDW: 12.5 % (ref 11.5–15.5)
WBC: 12.2 10*3/uL — ABNORMAL HIGH (ref 4.0–10.5)

## 2017-04-22 MED ORDER — OXYCODONE HCL 5 MG PO TABS
5.0000 mg | ORAL_TABLET | ORAL | Status: DC | PRN
  Administered 2017-04-22: 5 mg via ORAL
  Filled 2017-04-22: qty 1

## 2017-04-22 MED ORDER — ACETAMINOPHEN 500 MG PO TABS
1000.0000 mg | ORAL_TABLET | Freq: Three times a day (TID) | ORAL | Status: DC
  Administered 2017-04-22: 1000 mg via ORAL
  Filled 2017-04-22: qty 2

## 2017-04-22 MED ORDER — IBUPROFEN 600 MG PO TABS: 600.0000 mg | ORAL_TABLET | Freq: Four times a day (QID) | ORAL | Status: DC | PRN

## 2017-04-22 MED ORDER — IBUPROFEN 200 MG PO TABS: ORAL_TABLET | ORAL | Status: DC

## 2017-04-22 MED ORDER — OXYCODONE HCL 5 MG PO TABS: 5.0000 mg | ORAL_TABLET | ORAL | 0 refills | Status: DC | PRN

## 2017-04-22 MED ORDER — ACETAMINOPHEN 500 MG PO TABS: 1000.0000 mg | ORAL_TABLET | Freq: Three times a day (TID) | ORAL | 0 refills | Status: DC

## 2017-04-22 NOTE — Progress Notes (Signed)
1 Day Post-Op    CC:  Abdominal pain  Subjective: She is sore but she want to go home, ate part of her husbands breakfast and has not had PO pain meds.  Site looks fine.    Objective: Vital signs in last 24 hours: Temp:  [97.3 F (36.3 C)-99.6 F (37.6 C)] 98.6 F (37 C) (10/31 0457) Pulse Rate:  [83-111] 88 (10/31 0457) Resp:  [8-20] 18 (10/31 0457) BP: (102-124)/(50-72) 102/50 (10/31 0457) SpO2:  [99 %-100 %] 99 % (10/31 0457) Last BM Date: 04/21/17  Intake/Output from previous day: 10/30 0701 - 10/31 0700 In: 1108.8 [I.V.:1108.8] Out: 50 [Blood:50] Intake/Output this shift: No intake/output data recorded.  General appearance: alert, cooperative and no distress Resp: clear to auscultation bilaterally GI: soft, sore, sites look fine.   Lab Results:   Recent Labs  04/21/17 1258 04/22/17 0547  WBC 14.7* 12.2*  HGB 12.6 11.8*  HCT 38.0 35.8*  PLT 280 263    BMET  Recent Labs  04/21/17 1258  NA 135  K 4.0  CL 102  CO2 22  GLUCOSE 146*  BUN 11  CREATININE 0.70  CALCIUM 9.0   PT/INR No results for input(s): LABPROT, INR in the last 72 hours.   Recent Labs Lab 04/21/17 1258  AST 19  ALT 15  ALKPHOS 48  BILITOT 0.6  PROT 6.6  ALBUMIN 3.7     Lipase     Component Value Date/Time   LIPASE 19 04/21/2017 1258     Prior to Admission medications   Medication Sig Start Date End Date Taking? Authorizing Provider  fluocinonide (LIDEX) 0.05 % external solution 1 application once a week. APPLY TO SCALP 01/14/17  Yes [provider]  ketoconazole (NIZORAL) 2 % shampoo 1 application once a week. SCALP 04/16/17  Yes [provider]  Multiple Vitamin (MULTIVITAMIN WITH MINERALS) TABS tablet Take 1 tablet by mouth daily.   Yes [provider]  norethindrone (CAMILA) 0.35 MG tablet Take 1 tablet by mouth daily.   Yes [provider]  ibuprofen (ADVIL,MOTRIN) 600 MG tablet Take 1 tablet (600 mg total) by mouth every 6 (six)  hours. Patient not taking: Reported on 04/21/2017 10/07/15   Marcelle Overlie, MD   . sodium chloride 75 mL/hr at 04/21/17 2257   Anti-infectives    Start     Dose/Rate Route Frequency Ordered Stop   04/21/17 2000  ciprofloxacin (CIPRO) IVPB 400 mg     400 mg 200 mL/hr over 60 Minutes Intravenous  Once 04/21/17 1925 04/21/17 2026   04/21/17 2000  metroNIDAZOLE (FLAGYL) IVPB 500 mg     500 mg 100 mL/hr over 60 Minutes Intravenous  Once 04/21/17 1925 04/21/17 2052   04/21/17 1915  ciprofloxacin (CIPRO) IVPB 400 mg  Status:  Discontinued     400 mg 200 mL/hr over 60 Minutes Intravenous  Once 04/21/17 1903 04/21/17 1903   04/21/17 1915  metroNIDAZOLE (FLAGYL) IVPB 500 mg  Status:  Discontinued     500 mg 100 mL/hr over 60 Minutes Intravenous  Once 04/21/17 1903 04/21/17 1903      Medications: . HYDROmorphone      . Influenza vac split quadrivalent PF  0.5 mL Intramuscular Tomorrow-1000  . ketorolac  30 mg Intravenous Q6H    Assessment/Plan Acute appendicitis Laparoscopic appendectomy, 04/21/17, Dr. Feliciana Rossetti FEN: IV fluids/Regular diet ID:  Pre op DVT:  SCD Foley: none Follow up:  DOW   Plan:  /try PO pain meds and discharge  home later this AM.  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    LOS: 0 days    Demareon Coldwell 04/22/2017 [phone]

## 2017-04-22 NOTE — Progress Notes (Signed)
Patient being discharged home. Prescription given to patient. Patient verbalizes understanding. IV removed. Spouse here to get patient.

## 2017-04-29 NOTE — Discharge Summary (Signed)
Physician Discharge Summary  Patient ID: Allison Fischer MRN: [id number] DOB/AGE: 30/02/1986 30 y.o.  Admit date: 04/21/2017 Discharge date: 04/22/2017  Admission Diagnoses:  Acute appendicitis  Discharge Diagnoses:  Acute appendicitis Active Problems:   Acute appendicitis  PROCEDURES: Laparoscopic appendectomy 04/21/17 Dr. Windell MomentLuke Kinsinger  Hospital Course:   31 yo female with 1 day history of abdominal pain. Pain started periumbilically and now radiates to her right side and around her back. She has nausea and vomited multiple times. She had diarrhea 3 times today. She has never had a pain this severe. Her last meal was 530am.  She was seen in the operating room by Dr. Feliciana RossettiLuke Kinsinger.  CT scan was consistent with acute appendicitis.  She was admitted and taken to the operating room by Dr. Sheliah HatchKinsinger that evening.  She tolerated the procedure well and returned to the floor in satisfactory condition. First postoperative morning she was ambulating tolerating regular diet and was ready for discharge at morning.  Condition on discharge: Improved. CBC Latest Ref Rng & Units 04/22/2017 04/21/2017 10/07/2015  WBC 4.0 - 10.5 K/uL 12.2(H) 14.7(H) 11.8(H)  Hemoglobin 12.0 - 15.0 g/dL 11.8(L) 12.6 10.7(L)  Hematocrit 36.0 - 46.0 % 35.8(L) 38.0 31.5(L)  Platelets 150 - 400 K/uL 263 280 243    CMP Latest Ref Rng & Units 04/21/2017 04/15/2014 08/30/2013  Glucose 65 - 99 mg/dL 132(G146(H) 401(U119(H) 272(Z102(H)  BUN 6 - 20 mg/dL 11 8 6   Creatinine 0.44 - 1.00 mg/dL 3.660.70 4.400.67 3.470.66  Sodium 135 - 145 mmol/L 135 138 138  Potassium 3.5 - 5.1 mmol/L 4.0 3.7 4.0  Chloride 101 - 111 mmol/L 102 102 106  CO2 22 - 32 mmol/L 22 23 23   Calcium 8.9 - 10.3 mg/dL 9.0 9.3 8.4  Total Protein 6.5 - 8.1 g/dL 6.6 7.2 6.5  Total Bilirubin 0.3 - 1.2 mg/dL 0.6 0.7 0.9  Alkaline Phos 38 - 126 U/L 48 43 39  AST 15 - 41 U/L 19 25 24   ALT 14 - 54 U/L 15 15 19    Disposition: 01-Home or Self Care   Allergies as of 04/22/2017    Reactions   Penicillins Rash   Has patient had a PCN reaction causing immediate rash, facial/tongue/throat swelling, SOB or lightheadedness with hypotension: Yes Has patient had a PCN reaction causing severe rash involving mucus membranes or skin necrosis: No Has patient had a PCN reaction that required hospitalization No Has patient had a PCN reaction occurring within the last 10 years: No If all of the above answers are "NO", then may proceed with Cephalosporin use.   Tape Rash      Medication List    TAKE these medications   acetaminophen 500 MG tablet Commonly known as:  TYLENOL Take 2 tablets (1,000 mg total) by mouth every 8 (eight) hours.   CAMILA 0.35 MG tablet Generic drug:  norethindrone Take 1 tablet by mouth daily.   fluocinonide 0.05 % external solution Commonly known as:  LIDEX 1 application once a week. APPLY TO SCALP Notes to patient:  Once per week   ibuprofen 200 MG tablet Commonly known as:  ADVIL,MOTRIN You can take 2-3 tablets every 6 hours for pain as needed.  Do not exceed this dose.  Use this and Tylenol as your primary pain medication.  You can buy this over the counter at any drug store. What changed:    medication strength  how much to take  how to take this  when to take this  additional  instructions   ketoconazole 2 % shampoo Commonly known as:  NIZORAL 1 application once a week. SCALP Notes to patient:  Once per week   multivitamin with minerals Tabs tablet Take 1 tablet by mouth daily.   oxyCODONE 5 MG immediate release tablet Commonly known as:  Oxy IR/ROXICODONE Take 1-2 tablets (5-10 mg total) by mouth every 4 (four) hours as needed for moderate pain or severe pain (Give her one now before discharge).      Follow-up Information    [facility].   Specialty:  General Surgery Why:  Our office will call with follow up appointment in 2-3 weeks.  Be at the office 30 minutes early for check in.  Bring photo  ID and insurance information.   Contact information: [address] [place] [address] [phone]           Signed: Sherrie GeorgeJENNINGS,Denicia Pagliarulo 04/29/2017, 4:43 PM

## 2017-09-28 ENCOUNTER — Emergency Department (HOSPITAL_COMMUNITY)
Admission: EM | Admit: 2017-09-28 | Discharge: 2017-09-29 | Disposition: A | Discharge status: Home / Self Care | Payer: BLUE CROSS/BLUE SHIELD | Attending: Emergency Medicine | Admitting: Emergency Medicine

## 2017-09-28 ENCOUNTER — Emergency Department (HOSPITAL_COMMUNITY): Payer: BLUE CROSS/BLUE SHIELD

## 2017-09-28 ENCOUNTER — Encounter (HOSPITAL_COMMUNITY): Payer: Self-pay | Admitting: Family Medicine

## 2017-09-28 DIAGNOSIS — R1032 Left lower quadrant pain: Secondary | ICD-10-CM | POA: Insufficient documentation

## 2017-09-28 DIAGNOSIS — R197 Diarrhea, unspecified: Secondary | ICD-10-CM | POA: Diagnosis not present

## 2017-09-28 DIAGNOSIS — Z79899 Other long term (current) drug therapy: Secondary | ICD-10-CM | POA: Diagnosis not present

## 2017-09-28 DIAGNOSIS — R112 Nausea with vomiting, unspecified: Secondary | ICD-10-CM | POA: Diagnosis not present

## 2017-09-28 LAB — URINALYSIS, ROUTINE W REFLEX MICROSCOPIC
Bilirubin Urine: NEGATIVE
Glucose, UA: NEGATIVE mg/dL
Hgb urine dipstick: NEGATIVE
Ketones, ur: NEGATIVE mg/dL
Leukocytes, UA: NEGATIVE
Nitrite: NEGATIVE
PH: 5 (ref 5.0–8.0)
Protein, ur: NEGATIVE mg/dL
SPECIFIC GRAVITY, URINE: 1.028 (ref 1.005–1.030)

## 2017-09-28 LAB — COMPREHENSIVE METABOLIC PANEL
ALBUMIN: 3.8 g/dL (ref 3.5–5.0)
ALK PHOS: 55 U/L (ref 38–126)
ALT: 16 U/L (ref 14–54)
ANION GAP: 11 (ref 5–15)
AST: 19 U/L (ref 15–41)
BUN: 18 mg/dL (ref 6–20)
CALCIUM: 9.3 mg/dL (ref 8.9–10.3)
CHLORIDE: 106 mmol/L (ref 101–111)
CO2: 20 mmol/L — ABNORMAL LOW (ref 22–32)
Creatinine, Ser: 0.68 mg/dL (ref 0.44–1.00)
GFR calc non Af Amer: >60 mL/min (ref >60)
Glucose, Bld: 109 mg/dL — ABNORMAL HIGH (ref 65–99)
POTASSIUM: 4.6 mmol/L (ref 3.5–5.1)
SODIUM: 137 mmol/L (ref 135–145)
Total Bilirubin: 0.7 mg/dL (ref 0.3–1.2)
Total Protein: 7.1 g/dL (ref 6.5–8.1)

## 2017-09-28 LAB — CBC
HEMATOCRIT: 40.3 % (ref 36.0–46.0)
HEMOGLOBIN: 13.4 g/dL (ref 12.0–15.0)
MCH: 28.8 pg (ref 26.0–34.0)
MCHC: 33.3 g/dL (ref 30.0–36.0)
MCV: 86.5 fL (ref 78.0–100.0)
Platelets: 377 10*3/uL (ref 150–400)
RBC: 4.66 MIL/uL (ref 3.87–5.11)
RDW: 12.4 % (ref 11.5–15.5)
WBC: 14.1 10*3/uL — ABNORMAL HIGH (ref 4.0–10.5)

## 2017-09-28 LAB — I-STAT BETA HCG BLOOD, ED (MC, WL, AP ONLY)

## 2017-09-28 LAB — CBG MONITORING, ED: Glucose-Capillary: 77 mg/dL (ref 65–99)

## 2017-09-28 LAB — LIPASE, BLOOD: LIPASE: 27 U/L (ref 11–51)

## 2017-09-28 MED ORDER — GI COCKTAIL ~~LOC~~
30.0000 mL | Freq: Once | ORAL | Status: AC
  Administered 2017-09-28: 30 mL via ORAL
  Filled 2017-09-28: qty 30

## 2017-09-28 MED ORDER — METOCLOPRAMIDE HCL 10 MG PO TABS: 10.0000 mg | ORAL_TABLET | Freq: Four times a day (QID) | ORAL | 0 refills | Status: DC

## 2017-09-28 MED ORDER — IOPAMIDOL (ISOVUE-300) INJECTION 61%
INTRAVENOUS | Status: AC
  Filled 2017-09-28: qty 100

## 2017-09-28 MED ORDER — DICYCLOMINE HCL 20 MG PO TABS: 20.0000 mg | ORAL_TABLET | Freq: Two times a day (BID) | ORAL | 0 refills | Status: DC

## 2017-09-28 MED ORDER — SODIUM CHLORIDE 0.9 % IV BOLUS
1000.0000 mL | Freq: Once | INTRAVENOUS | Status: AC
  Administered 2017-09-28: 1000 mL via INTRAVENOUS

## 2017-09-28 MED ORDER — IOPAMIDOL (ISOVUE-300) INJECTION 61%
100.0000 mL | Freq: Once | INTRAVENOUS | Status: AC | PRN
  Administered 2017-09-28: 100 mL via INTRAVENOUS

## 2017-09-28 MED ORDER — ONDANSETRON 4 MG PO TBDP
4.0000 mg | ORAL_TABLET | Freq: Once | ORAL | Status: AC | PRN
  Administered 2017-09-28: 4 mg via ORAL
  Filled 2017-09-28: qty 1

## 2017-09-28 MED ORDER — METOCLOPRAMIDE HCL 5 MG/ML IJ SOLN
10.0000 mg | Freq: Once | INTRAMUSCULAR | Status: AC
  Administered 2017-09-28: 10 mg via INTRAVENOUS
  Filled 2017-09-28: qty 2

## 2017-09-28 MED ORDER — ONDANSETRON HCL 4 MG/2ML IJ SOLN
4.0000 mg | Freq: Once | INTRAMUSCULAR | Status: AC
  Administered 2017-09-28: 4 mg via INTRAVENOUS
  Filled 2017-09-28: qty 2

## 2017-09-28 MED ORDER — HYDROMORPHONE HCL 1 MG/ML IJ SOLN
0.5000 mg | Freq: Once | INTRAMUSCULAR | Status: AC
  Administered 2017-09-28: 0.5 mg via INTRAVENOUS
  Filled 2017-09-28: qty 1

## 2017-09-28 NOTE — ED Notes (Signed)
No answer when called for recheck on vital signs 

## 2017-09-28 NOTE — ED Provider Notes (Signed)
[facility] Provider [facility]  CSN: [id number] Arrival date & time: 09/28/17 1318  Chief Complaint(s) Abdominal Pain  HPI Allison Fischer is a 32 y.o. female   The history is provided by the patient.  Abdominal Pain   This is a new problem. The current episode started 6 to 12 hours ago. The problem occurs constantly. Progression since onset: fluctuating. The pain is located in the periumbilical region and LLQ. The quality of the pain is cramping. Pain severity now: mod to severe. Associated symptoms include diarrhea (once), nausea and vomiting (NBNB). Pertinent negatives include fever, hematochezia, melena and dysuria. Nothing aggravates the symptoms. Nothing relieves the symptoms.   No known sick contacts.  Unsure about any possible suspicious food intake but has been reported that they went to the zoo this weekend.  No recent travels.  No recent antibiotics.  Past Medical History Past Medical History:  Diagnosis Date  . Allergy   . Kidney stones    Patient Active Problem List   Diagnosis Date Noted  . Acute appendicitis 04/21/2017  . NSVD (normal spontaneous vaginal delivery) 10/06/2015  . Indication for care in labor or delivery 10/06/2015   Home Medication(s) Prior to Admission medications   Medication Sig Start Date End Date Taking? Authorizing Provider  COLLAGEN PO Take 1 Package by mouth daily.   Yes [provider]  LORYNA 3-0.02 MG tablet Take 1 tablet by mouth daily. 07/13/17  Yes [provider]  Multiple Vitamin (MULTIVITAMIN WITH MINERALS) TABS tablet Take 1 tablet by mouth daily.   Yes [provider]  acetaminophen (TYLENOL) 500 MG tablet Take 2 tablets (1,000 mg total) by mouth every 8 (eight) hours. Patient not taking: Reported on 09/28/2017 04/22/17   Sherrie GeorgeJennings, Willard, PA-C  ibuprofen (ADVIL,MOTRIN) 200 MG tablet You can take 2-3 tablets every 6 hours for pain as needed.  Do not exceed this dose.  Use this and  Tylenol as your primary pain medication.  You can buy this over the counter at any drug store. Patient not taking: Reported on 09/28/2017 04/22/17   Sherrie GeorgeJennings, Willard, PA-C  metoCLOPramide (REGLAN) 10 MG tablet Take 1 tablet (10 mg total) by mouth every 6 (six) hours. 09/28/17   Plumer Mittelstaedt, Amadeo GarnetPedro Eduardo, MD  oxyCODONE (OXY IR/ROXICODONE) 5 MG immediate release tablet Take 1-2 tablets (5-10 mg total) by mouth every 4 (four) hours as needed for moderate pain or severe pain (Give her one now before discharge). Patient not taking: Reported on 09/28/2017 04/22/17   Sherrie GeorgeJennings, Willard, PA-C                                                                                                                                    Past Surgical History Past Surgical History:  Procedure Laterality Date  . EYE SURGERY    . LAPAROSCOPIC APPENDECTOMY N/A 04/21/2017   Procedure: APPENDECTOMY LAPAROSCOPIC;  Surgeon: Kinsinger, De BlanchLuke Aaron, MD;  [facility];  Service: General;  Laterality: N/A;   Family History Family History  Problem Relation Age of Onset  . Hypertension Maternal Grandmother   . Cancer Maternal Grandmother   . Heart attack Maternal Grandfather   . Heart attack Paternal Grandfather     Social History Social History   Tobacco Use  . Smoking status: Never Smoker  . Smokeless tobacco: Never Used  Substance Use Topics  . Alcohol use: Not Currently    Alcohol/week: 0.0 oz  . Drug use: No   Allergies Penicillins and Tape  Review of Systems Review of Systems  Constitutional: Negative for fever.  Gastrointestinal: Positive for abdominal pain, diarrhea (once), nausea and vomiting (NBNB). Negative for hematochezia and melena.  Genitourinary: Negative for dysuria.   All other systems are reviewed and are negative for acute change except as noted in the HPI  Physical Exam Vital Signs  I have reviewed the triage vital signs BP 118/76 (BP Location: Right Arm)   Pulse 97   Temp (!) 97.5 F  (36.4 C) (Oral)   Resp 16   Ht 5\' 2"  (1.575 m)   Wt 81.2 kg (179 lb)   LMP 06/08/2017 Comment: Patient reports since taking birthcontrol pills, her periods have not been regular.   SpO2 100%   BMI 32.74 kg/m   Physical Exam  Constitutional: She is oriented to person, place, and time. She appears well-developed and well-nourished. No distress.  HENT:  Head: Normocephalic and atraumatic.  Right Ear: External ear normal.  Left Ear: External ear normal.  Nose: Nose normal.  Eyes: Conjunctivae and EOM are normal. No scleral icterus.  Neck: Normal range of motion and phonation normal.  Cardiovascular: Normal rate and regular rhythm.  Pulmonary/Chest: Effort normal. No stridor. No respiratory distress.  Abdominal: She exhibits no distension. There is tenderness in the epigastric area, periumbilical area, left upper quadrant and left lower quadrant. There is no rigidity, no rebound, no guarding and no CVA tenderness.  Musculoskeletal: Normal range of motion. She exhibits no edema.  Neurological: She is alert and oriented to person, place, and time.  Skin: She is not diaphoretic.  Psychiatric: She has a normal mood and affect. Her behavior is normal.  Vitals reviewed.   ED Results and Treatments Labs (all labs ordered are listed, but only abnormal results are displayed) Labs Reviewed  COMPREHENSIVE METABOLIC PANEL - Abnormal; Notable for the following components:      Result Value   CO2 20 (*)    Glucose, Bld 109 (*)    All other components within normal limits  CBC - Abnormal; Notable for the following components:   WBC 14.1 (*)    All other components within normal limits  LIPASE, BLOOD  URINALYSIS, ROUTINE W REFLEX MICROSCOPIC  I-STAT BETA HCG BLOOD, ED ([facility], [facility], [facility] ONLY)  CBG MONITORING, ED  EKG  EKG Interpretation  Date/Time:    Ventricular Rate:    PR  Interval:    QRS Duration:   QT Interval:    QTC Calculation:   R Axis:     Text Interpretation:        Radiology Ct Abdomen Pelvis W Contrast  Result Date: 09/28/2017 CLINICAL DATA:  Acute periumbilical abdominal pain. EXAM: CT ABDOMEN AND PELVIS WITH CONTRAST TECHNIQUE: Multidetector CT imaging of the abdomen and pelvis was performed using the standard protocol following bolus administration of intravenous contrast. CONTRAST:  ISOVUE-300 IOPAMIDOL (ISOVUE-300) INJECTION 61% COMPARISON:  CT scan of April 21, 2017. FINDINGS: Lower chest: No acute abnormality. Hepatobiliary: No focal liver abnormality is seen. No gallstones, gallbladder wall thickening, or biliary dilatation. Pancreas: Unremarkable. No pancreatic ductal dilatation or surrounding inflammatory changes. Spleen: Normal in size without focal abnormality. Adrenals/Urinary Tract: Adrenal glands are unremarkable. Kidneys are normal, without renal calculi, focal lesion, or hydronephrosis. Bladder is unremarkable. Stomach/Bowel: The stomach appears normal. There is no evidence of bowel obstruction or inflammation. Status post appendectomy. Vascular/Lymphatic: No enlarged abdominal or pelvic lymph nodes. Reproductive: Uterus and ovaries appear normal. Enlarged left pelvic varices are noted as well as dilated left ovarian vein, consistent with pelvic congestion syndrome. Other: No abdominal wall hernia or abnormality. No abdominopelvic ascites. Musculoskeletal: No acute or significant osseous findings. IMPRESSION: Enlarged left pelvic varices are noted with dilated left ovarian vein, consistent with pelvic congestion syndrome. No acute abnormality seen in the abdomen or pelvis. Electronically Signed   By: Lupita Raider, M.D.   On: 09/28/2017 21:34   Pertinent labs & imaging results that were available during my care of the patient were reviewed by me and considered in my medical decision making (see chart for details).  Medications  Ordered in ED Medications  iopamidol (ISOVUE-300) 61 % injection (has no administration in time range)  ondansetron (ZOFRAN-ODT) disintegrating tablet 4 mg (4 mg Oral Given 09/28/17 1402)  ondansetron (ZOFRAN) injection 4 mg (4 mg Intravenous Given 09/28/17 2020)  sodium chloride 0.9 % bolus 1,000 mL (1,000 mLs Intravenous New Bag/Given 09/28/17 2020)  HYDROmorphone (DILAUDID) injection 0.5 mg (0.5 mg Intravenous Given 09/28/17 2020)  iopamidol (ISOVUE-300) 61 % injection 100 mL (100 mLs Intravenous Contrast Given 09/28/17 2103)  gi cocktail (Maalox,Lidocaine,Donnatal) (30 mLs Oral Given 09/28/17 2241)  metoCLOPramide (REGLAN) injection 10 mg (10 mg Intravenous Given 09/28/17 2241)                                                                                                                                    Procedures Procedures  (including critical care time)  Medical Decision Making / ED Course I have reviewed the nursing notes for this encounter and the patient's prior records (if available in EHR or on provided paperwork).    Labs with leukocytosis.  Otherwise grossly reassuring without evidence of hepatitis or biliary obstruction.  Kidney function intact.  UA negative.  Given abdominal pain and elevated white count, CT scan was obtained which did not reveal any evidence of serious intra-abdominal inflammatory/infectious process.  Did reveal incidental pelvic congestion syndrome.  Likely viral process versus food poisoning.  Provided with antiemetics and IV fluids.  Also given GI cocktail and oral hydration which she was able to tolerate.  The patient appears reasonably screened and/or stabilized for discharge and I doubt any other medical condition or other [facility] requiring further screening, evaluation, or treatment in the ED at this time prior to discharge.  The patient is safe for discharge with strict return precautions.   Final Clinical Impression(s) / ED Diagnoses Final diagnoses:    Nausea vomiting and diarrhea  LLQ pain    Disposition: Discharge  Condition: Good  I have discussed the results, Dx and Tx plan with the patient who expressed understanding and agree(s) with the plan. Discharge instructions discussed at great length. The patient was given strict return precautions who verbalized understanding of the instructions. No further questions at time of discharge.    ED Discharge Orders        Ordered    metoCLOPramide (REGLAN) 10 MG tablet  Every 6 hours     09/28/17 2348       Follow Up: Primary care provider   If you do not have a primary care physician, contact [facility] at [phone] for referral     This chart was dictated using voice recognition software.  Despite best efforts to proofread,  errors can occur which can change the documentation meaning.   Nira Conn, MD 09/28/17 [phone]

## 2017-09-28 NOTE — ED Notes (Signed)
ED TO INPATIENT HANDOFF REPORT  Name/Age/Gender Allison Fischer 32 y.o. female  Code Status Code Status History    Date Active Date Inactive Code Status Order ID Comments User Context   04/21/2017 2249 04/22/2017 1440 Full Code [id number]  Kinsinger, Arta Bruce, MD Inpatient   10/06/2015 1157 10/06/2015 1203 Full Code [id number]  Lestine Box, RN Inpatient   10/06/2015 1157 10/06/2015 1157 Full Code [id number]  Dian Queen, MD Inpatient      Home/SNF/Other Home  Chief Complaint ABD PAIN   Level of Care/Admitting Diagnosis ED Disposition    None      Medical History Past Medical History:  Diagnosis Date  . Allergy   . Kidney stones     Allergies Allergies  Allergen Reactions  . Penicillins Rash    Has patient had a PCN reaction causing immediate rash, facial/tongue/throat swelling, SOB or lightheadedness with hypotension: Yes Has patient had a PCN reaction causing severe rash involving mucus membranes or skin necrosis: No Has patient had a PCN reaction that required hospitalization No Has patient had a PCN reaction occurring within the last 10 years: No If all of the above answers are "NO", then may proceed with Cephalosporin use.   . Tape Rash    IV Location/Drains/Wounds Patient Lines/Drains/Airways Status   Active Line/Drains/Airways    Name:   Placement date:   Placement time:   Site:   Days:   Peripheral IV 09/28/17 Left Antecubital   09/28/17    2020    Antecubital   less than 1   Incision (Closed) 04/21/17 Abdomen Other (Comment)   04/21/17    2020     160   Incision - 3 Ports Abdomen 1: Umbilicus 2: Lower;Mid Left;Medial   04/21/17    2044     160          Labs/Imaging Results for orders placed or performed during the hospital encounter of 09/28/17 (from the past 48 hour(s))  Lipase, blood     Status: None   Collection Time: 09/28/17  2:06 PM  Result Value Ref Range   Lipase 27 11 - 51 U/L    Comment: Performed at [facility], [place] [address] [place], [place] [address]  Comprehensive metabolic panel     Status: Abnormal   Collection Time: 09/28/17  2:06 PM  Result Value Ref Range   Sodium 137 135 - 145 mmol/L   Potassium 4.6 3.5 - 5.1 mmol/L   Chloride 106 101 - 111 mmol/L   CO2 20 (L) 22 - 32 mmol/L   Glucose, Bld 109 (H) 65 - 99 mg/dL   BUN 18 6 - 20 mg/dL   Creatinine, Ser 0.68 0.44 - 1.00 mg/dL   Calcium 9.3 8.9 - 10.3 mg/dL   Total Protein 7.1 6.5 - 8.1 g/dL   Albumin 3.8 3.5 - 5.0 g/dL   AST 19 15 - 41 U/L   ALT 16 14 - 54 U/L   Alkaline Phosphatase 55 38 - 126 U/L   Total Bilirubin 0.7 0.3 - 1.2 mg/dL   GFR calc non Af Amer >60 >60 mL/min   GFR calc Af Amer >60 >60 mL/min    Comment: (NOTE) The eGFR has been calculated using the CKD EPI equation. This calculation has not been validated in all clinical situations. eGFR's persistently <60 mL/min signify possible Chronic Kidney Disease.    Anion gap 11 5 - 15    Comment: Performed at [facility], [place]  [address] [place], [place] [address]  CBC     Status: Abnormal   Collection Time: 09/28/17  2:06 PM  Result Value Ref Range   WBC 14.1 (H) 4.0 - 10.5 K/uL   RBC 4.66 3.87 - 5.11 MIL/uL   Hemoglobin 13.4 12.0 - 15.0 g/dL   HCT 40.3 36.0 - 46.0 %   MCV 86.5 78.0 - 100.0 fL   MCH 28.8 26.0 - 34.0 pg   MCHC 33.3 30.0 - 36.0 g/dL   RDW 12.4 11.5 - 15.5 %   Platelets 377 150 - 400 K/uL    Comment: Performed at [facility], [place] [address], [place], [place] [address]  Urinalysis, Routine w reflex microscopic     Status: None   Collection Time: 09/28/17  2:06 PM  Result Value Ref Range   Color, Urine YELLOW YELLOW   APPearance CLEAR CLEAR   Specific Gravity, Urine 1.028 1.005 - 1.030   pH 5.0 5.0 - 8.0   Glucose, UA NEGATIVE NEGATIVE mg/dL   Hgb urine dipstick NEGATIVE NEGATIVE   Bilirubin Urine NEGATIVE NEGATIVE   Ketones, ur NEGATIVE NEGATIVE mg/dL   Protein, ur NEGATIVE NEGATIVE mg/dL    Nitrite NEGATIVE NEGATIVE   Leukocytes, UA NEGATIVE NEGATIVE    Comment: Performed at [facility], [place] [address] [place], [place] [address]  I-Stat beta hCG blood, ED     Status: None   Collection Time: 09/28/17  2:09 PM  Result Value Ref Range   I-stat hCG, quantitative <5.0 <5 mIU/mL   Comment 3            Comment:   GEST. AGE      CONC.  (mIU/mL)   <=1 WEEK        5 - 50     2 WEEKS       50 - 500     3 WEEKS       100 - 10,000     4 WEEKS     1,000 - 30,000        FEMALE AND NON-PREGNANT FEMALE:     LESS THAN 5 mIU/mL   CBG monitoring, ED     Status: None   Collection Time: 09/28/17  7:03 PM  Result Value Ref Range   Glucose-Capillary 77 65 - 99 mg/dL   No results found.  Pending Labs Unresulted Labs (From admission, onward)   None      Vitals/Pain Today's Vitals   09/28/17 1351 09/28/17 1353 09/28/17 1358 09/28/17 1858  BP: (!) 125/101  116/83 118/76  Pulse: 95   97  Resp: 20   16  Temp: (!) 97.5 F (36.4 C)     TempSrc: Oral     SpO2: 100%   100%  Weight:  179 lb (81.2 kg)    Height:  5' 2" (1.575 m)    PainSc:  10-Worst pain ever      Isolation Precautions No active isolations  Medications Medications  ondansetron (ZOFRAN-ODT) disintegrating tablet 4 mg (4 mg Oral Given 09/28/17 1402)  ondansetron (ZOFRAN) injection 4 mg (4 mg Intravenous Given 09/28/17 2020)  sodium chloride 0.9 % bolus 1,000 mL (1,000 mLs Intravenous New Bag/Given 09/28/17 2020)  HYDROmorphone (DILAUDID) injection 0.5 mg (0.5 mg Intravenous Given 09/28/17 2020)    Mobility walks with device

## 2017-09-28 NOTE — Discharge Instructions (Addendum)
During the workup we noted incidental findings on your imaging that would require you to follow-up with your regular doctor for further evaluation/management: Enlarged left pelvic varices are noted with dilated left ovarian vein, consistent with pelvic congestion syndrome.  Abdominal (belly) pain can be caused by many things. Your caregiver performed an examination and possibly ordered blood/urine tests and imaging (CT scan, x-rays, ultrasound). Many cases can be observed and treated at home after initial evaluation in the emergency department. Even though you are being discharged home, abdominal pain can be unpredictable. Therefore, you need a repeated exam if your pain does not resolve, returns, or worsens. Most patients with abdominal pain don't have to be admitted to the hospital or have surgery, but serious problems like appendicitis and gallbladder attacks can start out as nonspecific pain. Many abdominal conditions cannot be diagnosed in one visit, so follow-up evaluations are very important. SEEK IMMEDIATE MEDICAL ATTENTION IF: The pain does not go away or becomes severe.  A temperature above 101 develops.  Repeated vomiting occurs (multiple episodes).  The pain becomes localized to portions of the abdomen. The right side could possibly be appendicitis. In an adult, the left lower portion of the abdomen could be colitis or diverticulitis.  Blood is being passed in stools or vomit (bright red or black tarry stools).  Return also if you develop chest pain, difficulty breathing, dizziness or fainting, or become confused, poorly responsive, or inconsolable (young children).

## 2018-04-06 LAB — OB RESULTS CONSOLE HIV ANTIBODY (ROUTINE TESTING): HIV: NONREACTIVE

## 2018-04-06 LAB — OB RESULTS CONSOLE GBS: GBS: POSITIVE

## 2018-06-23 NOTE — L&D Delivery Note (Signed)
Delivery Note At 4:30 PM a viable female was delivered via Vaginal, Spontaneous (Presentation: LOA).  APGAR: 9, 9; weight pending.  Placenta status: S, I. 3V Cord with the following complications: none.  Cord pH: n/a  Anesthesia: CLEA   Episiotomy: None Lacerations: None Suture Repair: n/a Est. Blood Loss (mL): 50  Mom to postpartum.  Baby to Couplet care / Skin to Skin.  Mitchel Honour 11/02/2018, 4:49 PM

## 2018-08-17 LAB — OB RESULTS CONSOLE HIV ANTIBODY (ROUTINE TESTING): HIV: NONREACTIVE

## 2018-08-25 ENCOUNTER — Ambulatory Visit (HOSPITAL_COMMUNITY)
Admission: EM | Admit: 2018-08-25 | Discharge: 2018-08-25 | Disposition: A | Discharge status: Home / Self Care | Payer: BLUE CROSS/BLUE SHIELD | Attending: Emergency Medicine | Admitting: Emergency Medicine

## 2018-08-25 ENCOUNTER — Encounter (HOSPITAL_COMMUNITY): Payer: Self-pay | Admitting: Emergency Medicine

## 2018-08-25 ENCOUNTER — Other Ambulatory Visit: Payer: Self-pay

## 2018-08-25 DIAGNOSIS — J029 Acute pharyngitis, unspecified: Secondary | ICD-10-CM

## 2018-08-25 DIAGNOSIS — Z3A28 28 weeks gestation of pregnancy: Secondary | ICD-10-CM | POA: Diagnosis present

## 2018-08-25 DIAGNOSIS — J069 Acute upper respiratory infection, unspecified: Secondary | ICD-10-CM | POA: Diagnosis present

## 2018-08-25 LAB — POCT RAPID STREP A: STREPTOCOCCUS, GROUP A SCREEN (DIRECT): NEGATIVE

## 2018-08-25 MED ORDER — SALINE SPRAY 0.65 % NA SOLN: 1.0000 | NASAL | 0 refills | Status: DC | PRN

## 2018-08-25 MED ORDER — CETIRIZINE HCL 5 MG PO CHEW: 5.0000 mg | CHEWABLE_TABLET | Freq: Every day | ORAL | 0 refills | Status: DC

## 2018-08-25 NOTE — Discharge Instructions (Addendum)
Strep negative.  We will send out to culture and notify you of abnormal results Rest and push fluids Use OTC tylenol as needed for pain Zyrtec and ocean nasal spray prescribed.  Use as daily for symptomatic relief You may drink warm or cold liquids, or eat a cold popsicle  I have also included a list of medication safe in pregnancy (see attached) Follow up with OB or PCP if symptoms persists Return or go to the ED if you have any new or worsening symptoms such as fever, chills, nausea, vomiting, difficulty breathing, difficulty swallowing own saliva, drooling, voice changes, throat swelling, tongue swelling, etc..Marland Kitchen

## 2018-08-25 NOTE — ED Triage Notes (Signed)
Patient concerned for an episode that occurred during the night.  Reports difficulty swallowing.  Patient was able to swallow tylenol with water.

## 2018-08-25 NOTE — ED Notes (Signed)
Bed: UC01 Expected date:  Expected time:  Means of arrival:  Comments: 

## 2018-08-25 NOTE — ED Provider Notes (Signed)
[facility]   [id number] 08/25/18 Arrival Time: 4098   CC: URI symptoms   SUBJECTIVE: History from: patient.  Sadhika Blew is a 33 y.o. female [redacted] weeks pregnant, who presents with abrupt onset of runny nose, congestion, sore throat, and fatigue x 1 week.  Admits to son with similar symptoms.  Has tried OTC tylenol with relief.  Symptoms are made worse with swallowing, but tolerating own secretions and liquids without difficulty.  Denies fever, chills, SOB, wheezing, chest pain, nausea, changes in bowel or bladder habits.    ROS: As per HPI.  Past Medical History:  Diagnosis Date  . Allergy   . Kidney stones    Past Surgical History:  Procedure Laterality Date  . EYE SURGERY    . LAPAROSCOPIC APPENDECTOMY N/A 04/21/2017   Procedure: APPENDECTOMY LAPAROSCOPIC;  Surgeon: Kinsinger, De Blanch, MD;  Location: [facility] OR;  Service: General;  Laterality: N/A;   Allergies  Allergen Reactions  . Penicillins Rash    Has patient had a PCN reaction causing immediate rash, facial/tongue/throat swelling, SOB or lightheadedness with hypotension: Yes Has patient had a PCN reaction causing severe rash involving mucus membranes or skin necrosis: No Has patient had a PCN reaction that required hospitalization No Has patient had a PCN reaction occurring within the last 10 years: No If all of the above answers are "NO", then may proceed with Cephalosporin use.   . Tape Rash   No current facility-administered medications on file prior to encounter.    Current Outpatient Medications on File Prior to Encounter  Medication Sig Dispense Refill  . acetaminophen (TYLENOL) 500 MG tablet Take 2 tablets (1,000 mg total) by mouth every 8 (eight) hours. 30 tablet 0  . COLLAGEN PO Take 1 Package by mouth daily.    Marland Kitchen dicyclomine (BENTYL) 20 MG tablet Take 1 tablet (20 mg total) by mouth 2 (two) times daily. 20 tablet 0  . LORYNA 3-0.02 MG tablet Take 1 tablet by mouth daily.  1  . Multiple Vitamin  (MULTIVITAMIN WITH MINERALS) TABS tablet Take 1 tablet by mouth daily.     Social History   Socioeconomic History  . Marital status: Married    Spouse name: Not on file  . Number of children: Not on file  . Years of education: Not on file  . Highest education level: Not on file  Occupational History  . Not on file  Social Needs  . Financial resource strain: Not on file  . Food insecurity:    Worry: Not on file    Inability: Not on file  . Transportation needs:    Medical: Not on file    Non-medical: Not on file  Tobacco Use  . Smoking status: Never Smoker  . Smokeless tobacco: Never Used  Substance and Sexual Activity  . Alcohol use: Not Currently    Alcohol/week: 0.0 standard drinks  . Drug use: No  . Sexual activity: Not on file  Lifestyle  . Physical activity:    Days per week: Not on file    Minutes per session: Not on file  . Stress: Not on file  Relationships  . Social connections:    Talks on phone: Not on file    Gets together: Not on file    Attends religious service: Not on file    Active member of club or organization: Not on file    Attends meetings of clubs or organizations: Not on file    Relationship status: Not on file  .  Intimate partner violence:    Fear of current or ex partner: Not on file    Emotionally abused: Not on file    Physically abused: Not on file    Forced sexual activity: Not on file  Other Topics Concern  . Not on file  Social History Narrative  . Not on file   Family History  Problem Relation Age of Onset  . Hypertension Maternal Grandmother   . Cancer Maternal Grandmother   . Heart attack Maternal Grandfather   . Heart attack Paternal Grandfather     OBJECTIVE:  Vitals:   08/25/18 0950  BP: 111/75  Pulse: 90  Resp: 18  Temp: 98.4 F (36.9 C)  TempSrc: Oral  SpO2: 99%     General appearance: alert; appears mildly fatigued, but nontoxic; speaking in full sentences and tolerating own secretions HEENT: NCAT; Ears:  EACs clear, TMs pearly gray; Eyes: PERRL.  EOM grossly intact. Sinuses: nontender; Nose: nares patent without rhinorrhea, Throat: oropharynx clear, tonsils non erythematous or enlarged, uvula midline  Neck: supple without LAD Lungs: unlabored respirations, symmetrical air entry; cough: absent; no respiratory distress; CTAB Heart: regular rate and rhythm.  Radial pulses 2+ symmetrical bilaterally Abdomen: Pregnant Skin: warm and dry Psychological: alert and cooperative; normal mood and affect  ASSESSMENT & PLAN:  1. Viral URI   2. Sore throat   3. [redacted] weeks gestation of pregnancy     Meds ordered this encounter  Medications  . cetirizine (ZYRTEC) 5 MG chewable tablet    Sig: Chew 1 tablet (5 mg total) by mouth daily.    Dispense:  20 tablet    Refill:  0    Order Specific Question:   Supervising Provider    Answer:   Eustace Moore [[id number]]  . sodium chloride (OCEAN) 0.65 % SOLN nasal spray    Sig: Place 1 spray into both nostrils as needed.    Dispense:  30 mL    Refill:  0    Order Specific Question:   Supervising Provider    Answer:   Eustace Moore [[id number]]   Strep negative.  We will send out to culture and notify you of abnormal results Rest and push fluids Use OTC tylenol as needed for pain Zyrtec and ocean nasal spray prescribed.  Use daily for symptomatic relief You may drink warm or cold liquids, or eat a cold popsicle  I have also included a list of medications safe in pregnancy (see attached) Follow up with OB or PCP if symptoms persists Return or go to the ED if you have any new or worsening symptoms such as fever, chills, nausea, vomiting, difficulty breathing, difficulty swallowing own saliva, drooling, voice changes, throat swelling, tongue swelling, etc...  Reviewed expectations re: course of current medical issues. Questions answered. Outlined signs and symptoms indicating need for more acute intervention. Patient verbalized understanding. After  Visit Summary given.         Rennis Harding, PA-C 08/25/18 1104

## 2018-08-27 LAB — CULTURE, GROUP A STREP (THRC)

## 2018-10-20 ENCOUNTER — Inpatient Hospital Stay (HOSPITAL_COMMUNITY)
Admission: AD | Admit: 2018-10-20 | Discharge: 2018-10-20 | Disposition: A | Discharge status: Home / Self Care | Payer: BLUE CROSS/BLUE SHIELD | Attending: Obstetrics and Gynecology | Admitting: Obstetrics and Gynecology

## 2018-10-20 ENCOUNTER — Encounter (HOSPITAL_COMMUNITY): Payer: Self-pay | Admitting: *Deleted

## 2018-10-20 ENCOUNTER — Other Ambulatory Visit: Payer: Self-pay

## 2018-10-20 DIAGNOSIS — Z0371 Encounter for suspected problem with amniotic cavity and membrane ruled out: Secondary | ICD-10-CM

## 2018-10-20 DIAGNOSIS — O26893 Other specified pregnancy related conditions, third trimester: Secondary | ICD-10-CM | POA: Insufficient documentation

## 2018-10-20 DIAGNOSIS — Z3A36 36 weeks gestation of pregnancy: Secondary | ICD-10-CM | POA: Insufficient documentation

## 2018-10-20 HISTORY — DX: Other specified health status: Z78.9

## 2018-10-20 LAB — POCT FERN TEST: POCT Fern Test: NEGATIVE

## 2018-10-20 NOTE — Discharge Instructions (Signed)
Braxton Hicks Contractions °Contractions of the uterus can occur throughout pregnancy, but they are not always a sign that you are in labor. You may have practice contractions called Braxton Hicks contractions. These false labor contractions are sometimes confused with true labor. °What are Braxton Hicks contractions? °Braxton Hicks contractions are tightening movements that occur in the muscles of the uterus before labor. Unlike true labor contractions, these contractions do not result in opening (dilation) and thinning of the cervix. Toward the end of pregnancy (32-34 weeks), Braxton Hicks contractions can happen more often and may become stronger. These contractions are sometimes difficult to tell apart from true labor because they can be very uncomfortable. You should not feel embarrassed if you go to the hospital with false labor. °Sometimes, the only way to tell if you are in true labor is for your health care provider to look for changes in the cervix. The health care provider will do a physical exam and may monitor your contractions. If you are not in true labor, the exam should show that your cervix is not dilating and your water has not broken. °If there are no other health problems associated with your pregnancy, it is completely safe for you to be sent home with false labor. You may continue to have Braxton Hicks contractions until you go into true labor. °How to tell the difference between true labor and false labor °True labor °· Contractions last 30-70 seconds. °· Contractions become very regular. °· Discomfort is usually felt in the top of the uterus, and it spreads to the lower abdomen and low back. °· Contractions do not go away with walking. °· Contractions usually become more intense and increase in frequency. °· The cervix dilates and gets thinner. °False labor °· Contractions are usually shorter and not as strong as true labor contractions. °· Contractions are usually irregular. °· Contractions  are often felt in the front of the lower abdomen and in the groin. °· Contractions may go away when you walk around or change positions while lying down. °· Contractions get weaker and are shorter-lasting as time goes on. °· The cervix usually does not dilate or become thin. °Follow these instructions at home: ° °· Take over-the-counter and prescription medicines only as told by your health care provider. °· Keep up with your usual exercises and follow other instructions from your health care provider. °· Eat and drink lightly if you think you are going into labor. °· If Braxton Hicks contractions are making you uncomfortable: °? Change your position from lying down or resting to walking, or change from walking to resting. °? Sit and rest in a tub of warm water. °? Drink enough fluid to keep your urine pale yellow. Dehydration may cause these contractions. °? Do slow and deep breathing several times an hour. °· Keep all follow-up prenatal visits as told by your health care provider. This is important. °Contact a health care provider if: °· You have a fever. °· You have continuous pain in your abdomen. °Get help right away if: °· Your contractions become stronger, more regular, and closer together. °· You have fluid leaking or gushing from your vagina. °· You have bleeding from your vagina. °· You have low back pain that you never had before. °· You feel your baby’s head pushing down and causing pelvic pressure. °· Your baby is not moving inside you as much as it used to. °Summary °· Contractions that occur before labor are called Braxton Hicks contractions, false labor, or   practice contractions.  Braxton Hicks contractions are usually shorter, weaker, farther apart, and less regular than true labor contractions. True labor contractions usually become progressively stronger and regular, and they become more frequent.  Manage discomfort from [facility] contractions by changing position, resting in a warm bath,  drinking plenty of water, or practicing deep breathing. This information is not intended to replace advice given to you by your health care provider. Make sure you discuss any questions you have with your health care provider. Document Released: 10/23/2016 Document Revised: 03/24/2017 Document Reviewed: 10/23/2016 Elsevier Interactive Patient Education  2019 [employer]. Social Distancing FAQ: How It Helps Prevent COVID-19 (Coronavirus) and Steps We Can Take to Protect Ourselves There are many things we can do to prevent the spread of COVID-19 (coronavirus): washing our hands, coughing into our elbows, avoiding touching our faces, staying home if we're feeling sick and social distancing. But what is social distancing? These frequently asked questions explain how to practice social distancing in order to protect yourself, your loved ones and your community. General Information About Social Distancing Q: What is social distancing? A: Social distancing is the practice of purposefully reducing close contact between people. According to the CDC, social distancing means:  Remaining out of congregate settings as much as possible.  Avoiding mass gatherings.  Maintaining distance of about 6 feet from others when possible. Q: Why is social distancing important? A: Social distancing is crucial for preventing the spread of contagious illnesses such as COVID-19 (coronavirus). COVID-19 can spread through coughing, sneezing and close contact. By minimizing the amount of close contact we have with others, we reduce our chances of catching the virus and spreading it to our loved ones and within our community. Q: Who is social distancing important for? A: Social distancing is important for all of [place], but those of [place] who are at higher risk of serious complications caused by COVID-19 should be especially cautious about social distancing. People who are at high risk of complications include:  Older  adults.  People who have serious chronic medical conditions like heart disease, diabetes and lung disease. Q: What is flattening the curve? What does it have to do with social distancing? A: Flattening the curve refers to reducing the number of people who are sick at one time. If there are high surges in the number of COVID-19 cases all at once, health care systems and resources could potentially become overwhelmed. Efforts that help stop COVID-19 from spreading rapidly - like social distancing - help keep the number of people who are sick at one time as low as possible. Q: When should I start practicing social distancing? A: The best time to begin social distancing is before an illness like COVID-19 becomes widespread throughout your community. Each community's situation is unique, so it is important to follow the guidance of local government, health departments and health care providers. Currently in [place], gatherings of more than 10 people are banned. For the latest information on COVID-19 policies in [place], visit the [employer] and view news releases and executive orders. The advice below is applicable if you are symptom-free and have reasonable confidence that you have not had exposure to COVID-19. Always follow the guidance of local government, health departments and your health care providers. Visiting Public Spaces Q: How can I practice social distancing in the workplace? A: When possible, keeping about 6 feet of distance between yourself and others is key. It's also  important to practice other preventative measures such as washing hands, avoiding touching your face, coughing into your elbow and staying home if you feel sick. Depending on your job and your community's situation, working from home may be an option. Always follow local guidance. Q: How can my child practice social distancing at school or at college?  A: Many  schools and universities in the [place] have postponed in-person classes; currently in [place], K-12 schools have been closed until May 15 (this is subject to change). It's important to follow local guidance, as each community's situation is unique. It's important for people of all ages to follow preventative measures, including staying about 6 feet away from others, avoiding touching your face, washing your hands and coughing into your elbow. Q: Should I be concerned about going to the grocery store? A: In any place where large numbers of people gather, there is potential risk for disease transmission. When you visit the grocery store, keep about 6 feet between yourself and others and use prevention techniques like avoiding touching your face and washing your hands. If possible, visit the store at times when there are likely to be fewer people shopping. Q: Should I take public transportation? A: If you have the option, driving yourself, walking to work or working from home can help reduce the number of people who are using public transportation, which benefits you and your community. In any of these situations, it's very important to keep distance between yourself and others in addition to practicing other preventative measures. Q: Should I stop visiting restaurants and bars? A: Currently in [place], restaurants and bars have been closed until further notice. Always follow local guidance. Avoiding public places as much as possible helps prevent diseases from spreading. If dining out is a non-essential activity, then it is generally in the best interest of you and your loved ones to avoid it. Q: Can I still go to the gym? A: Currently in [place], gyms have been closed until further notice. Always follow local guidance. Alternatives could include exercising in your home or yard or walking through your neighborhood. If you do go to the gym, wipe down and sanitize your equipment, keep distance  between yourself and others, avoid touching other people and practice other preventative techniques.  Q: What about events and places where many people gather, such as concerts, festivals, sporting events and churches? A: Risk of disease transmission is much higher in large groups of people. Effective March 25 at 5 PM in [place], meetings of over 10 people are prohibited in order to prevent COVID-19 from spreading rapidly. Avoiding these gatherings are generally in the best interest of protecting yourself, your loved ones and your community. Always follow local guidance. Meeting with Others Q: Should I stop visiting my elderly relatives and friends?  A: Older adults are at high risk of serious complications from COVID-19. Limiting their exposure as much as possible to those who may be sick or who may be carrying the disease is crucial. This is a great opportunity to try other methods of connecting, such as over the phone or through a video chat. Q: What about social distancing with other people in my household? A: Avoiding close contact within a household is almost impossible, and social distancing is mainly focused on large groups. However, if someone in your household is sick, it's important to minimize close contact with them as much as is reasonable. Q: Should I stop meeting up with 1 or 2  friends? Should I stop dating? A: While meeting up with another person who also is symptom-free may be alright in some situations, keep in mind that risk of disease transmission is higher in public places. Now may be a good time to consider other methods of connecting with others, such as over the phone or through a video chat. Q: Can I have a small group of my extended family and/or friends over to my house? A: If possible, it's best to postpone these kinds of gatherings and look for alternative ways to connect. Avoiding non-essential gatherings is important for preventing the spread of disease. Q: Should  my family and friends cancel big gathering events like weddings and birthday parties? A: Effective March 25 at 5 PM in [place], meetings of over 10 people are prohibited in order to prevent COVID-19 from spreading rapidly. Always follow local guidance. While it's difficult to postpone important events like these, it's also very important to protect our loved ones - especially our loved ones who are most vulnerable. It may be best to postpone or alter your plans.  Q: If I'm avoiding in-person gatherings, how can I stay connected to others? A: There are many ways you can connect with friends: phone calls, text messages, emails and video chats are all great virtual options. While physical social distancing is important for our health, so is social interaction - trying alternative ways to stay connected is a good way to take care of your emotional health. If You Are Experiencing Symptoms Q: How should I approach social distancing if I start to feel sick? A: If you begin to experience symptoms, it's important to stay home and distance yourself from others. Always follow the guidance of your health care providers and local government. Q: Can I have visitors while I am in quarantine? A: Having visitors should be avoided as much as possible. Quarantining is an important way to protect your loved ones and community from picking up the disease; visitors are at high risk of catching the illness from you. Q: Can I go outside in my yard if I am in quarantine? A: Depending on where you live and your community's situation, going outside and getting some fresh air in your yard may be safe. Always follow the guidance of your health care providers and local government.

## 2018-10-20 NOTE — MAU Note (Signed)
Pt states she had gush of fluid when bending over to pick something up this morning, was running down her leg, clear fluid, no odor.  Hasn't leaked since then.  Denies contractions or bleeding.  Reports good fetal movement.

## 2018-10-20 NOTE — MAU Provider Note (Signed)
S: Ms. Allison Fischer is a 33 y.o. G2P1001 at [redacted]w[redacted]d  who presents to MAU today complaining of leaking of fluid since 0615. She had a large gush at that time, but she has not had any leaking since. She denies vaginal bleeding. She denies contractions. She reports normal fetal movement.    O: BP 117/72 (BP Location: Right Arm)   Temp 98.4 F (36.9 C) (Oral)   Resp 20   Ht 5' 2.5" (1.588 m)   Wt 102 kg   LMP 02/07/2018   BMI 40.46 kg/m  GENERAL: Well-developed, well-nourished female in no acute distress.  HEAD: Normocephalic, atraumatic.  CHEST: Normal effort of breathing, regular heart rate ABDOMEN: Soft, nontender, gravid PELVIC: Normal external female genitalia. Vagina is pink and rugated. Cervix with normal contour, no lesions. Normal discharge.  No pooling.   Cervical exam:  Dilation: 1 Effacement (%): Thick Station: -3 Presentation: Vertex Exam by:: Zorita Pang CNM   Fetal Monitoring: Baseline: 135 Variability: moderate Accelerations: 15x15 Decelerations: none Contractions: none  Results for orders placed or performed during the hospital encounter of 10/20/18 (from the past 24 hour(s))  Fern Test     Status: Normal   Collection Time: 10/20/18 10:30 AM  Result Value Ref Range   POCT Fern Test Negative = intact amniotic membranes      A: 1. Encounter for suspected premature rupture of membranes, with rupture of membranes not found   2. [redacted] weeks gestation of pregnancy     P: DC home 3rd Trimester precautions  labor precautions  Fetal kick counts RX: none  Return to MAU as needed FU with OB as planned  Follow-up Information    Candice Camp, MD Follow up.   Specialty:  Obstetrics and Gynecology Contact information: [address] [place] [address] [phone]           Thressa Sheller DNP, CNM  10/20/18  10:33 AM

## 2018-11-02 ENCOUNTER — Inpatient Hospital Stay (HOSPITAL_COMMUNITY): Payer: BLUE CROSS/BLUE SHIELD | Admitting: Anesthesiology

## 2018-11-02 ENCOUNTER — Other Ambulatory Visit: Payer: Self-pay

## 2018-11-02 ENCOUNTER — Encounter (HOSPITAL_COMMUNITY): Payer: Self-pay

## 2018-11-02 ENCOUNTER — Inpatient Hospital Stay (HOSPITAL_COMMUNITY)
Admission: AD | Admit: 2018-11-02 | Discharge: 2018-11-04 | DRG: 807 | Disposition: A | Discharge status: Home / Self Care | Payer: BLUE CROSS/BLUE SHIELD | Attending: Obstetrics & Gynecology | Admitting: Obstetrics & Gynecology

## 2018-11-02 DIAGNOSIS — O26893 Other specified pregnancy related conditions, third trimester: Secondary | ICD-10-CM | POA: Diagnosis present

## 2018-11-02 DIAGNOSIS — Z3490 Encounter for supervision of normal pregnancy, unspecified, unspecified trimester: Secondary | ICD-10-CM

## 2018-11-02 DIAGNOSIS — Z3A38 38 weeks gestation of pregnancy: Secondary | ICD-10-CM | POA: Diagnosis not present

## 2018-11-02 DIAGNOSIS — O99824 Streptococcus B carrier state complicating childbirth: Secondary | ICD-10-CM | POA: Diagnosis present

## 2018-11-02 DIAGNOSIS — Z1159 Encounter for screening for other viral diseases: Secondary | ICD-10-CM

## 2018-11-02 LAB — CBC
HCT: 37.7 % (ref 36.0–46.0)
Hemoglobin: 12.3 g/dL (ref 12.0–15.0)
MCH: 27.8 pg (ref 26.0–34.0)
MCHC: 32.6 g/dL (ref 30.0–36.0)
MCV: 85.1 fL (ref 80.0–100.0)
Platelets: 309 10*3/uL (ref 150–400)
RBC: 4.43 MIL/uL (ref 3.87–5.11)
RDW: 13.2 % (ref 11.5–15.5)
WBC: 9.8 10*3/uL (ref 4.0–10.5)
nRBC: 0 % (ref 0.0–0.2)

## 2018-11-02 LAB — TYPE AND SCREEN
ABO/RH(D): O POS
Antibody Screen: NEGATIVE

## 2018-11-02 LAB — SARS CORONAVIRUS 2 BY RT PCR (HOSPITAL ORDER, PERFORMED IN ~~LOC~~ HOSPITAL LAB): SARS Coronavirus 2: NEGATIVE

## 2018-11-02 MED ORDER — DIBUCAINE (PERIANAL) 1 % EX OINT: 1.0000 "application " | TOPICAL_OINTMENT | CUTANEOUS | Status: DC | PRN

## 2018-11-02 MED ORDER — COCONUT OIL OIL: 1.0000 "application " | TOPICAL_OIL | Status: DC | PRN

## 2018-11-02 MED ORDER — TETANUS-DIPHTH-ACELL PERTUSSIS 5-2.5-18.5 LF-MCG/0.5 IM SUSP: 0.5000 mL | Freq: Once | INTRAMUSCULAR | Status: DC

## 2018-11-02 MED ORDER — PHENYLEPHRINE 40 MCG/ML (10ML) SYRINGE FOR IV PUSH (FOR BLOOD PRESSURE SUPPORT): 80.0000 ug | PREFILLED_SYRINGE | INTRAVENOUS | Status: DC | PRN

## 2018-11-02 MED ORDER — CLINDAMYCIN PHOSPHATE 900 MG/50ML IV SOLN
900.0000 mg | Freq: Once | INTRAVENOUS | Status: AC
  Administered 2018-11-02: 900 mg via INTRAVENOUS
  Filled 2018-11-02: qty 50

## 2018-11-02 MED ORDER — ACETAMINOPHEN 325 MG PO TABS: 650.0000 mg | ORAL_TABLET | ORAL | Status: DC | PRN

## 2018-11-02 MED ORDER — OXYCODONE-ACETAMINOPHEN 5-325 MG PO TABS: 2.0000 | ORAL_TABLET | ORAL | Status: DC | PRN

## 2018-11-02 MED ORDER — OXYTOCIN BOLUS FROM INFUSION
500.0000 mL | Freq: Once | INTRAVENOUS | Status: AC
  Administered 2018-11-02: 500 mL via INTRAVENOUS

## 2018-11-02 MED ORDER — ONDANSETRON HCL 4 MG/2ML IJ SOLN: 4.0000 mg | INTRAMUSCULAR | Status: DC | PRN

## 2018-11-02 MED ORDER — DIPHENHYDRAMINE HCL 50 MG/ML IJ SOLN: 12.5000 mg | INTRAMUSCULAR | Status: DC | PRN

## 2018-11-02 MED ORDER — EPHEDRINE 5 MG/ML INJ: 10.0000 mg | INTRAVENOUS | Status: DC | PRN

## 2018-11-02 MED ORDER — OXYCODONE-ACETAMINOPHEN 5-325 MG PO TABS: 1.0000 | ORAL_TABLET | ORAL | Status: DC | PRN

## 2018-11-02 MED ORDER — LACTATED RINGERS IV SOLN
500.0000 mL | Freq: Once | INTRAVENOUS | Status: AC
  Administered 2018-11-02: 500 mL via INTRAVENOUS

## 2018-11-02 MED ORDER — ONDANSETRON HCL 4 MG/2ML IJ SOLN: 4.0000 mg | Freq: Four times a day (QID) | INTRAMUSCULAR | Status: DC | PRN

## 2018-11-02 MED ORDER — LIDOCAINE HCL (PF) 1 % IJ SOLN: 30.0000 mL | INTRAMUSCULAR | Status: DC | PRN

## 2018-11-02 MED ORDER — OXYTOCIN 40 UNITS IN NORMAL SALINE INFUSION - SIMPLE MED
2.5000 [IU]/h | INTRAVENOUS | Status: DC
  Filled 2018-11-02: qty 1000

## 2018-11-02 MED ORDER — ONDANSETRON HCL 4 MG PO TABS: 4.0000 mg | ORAL_TABLET | ORAL | Status: DC | PRN

## 2018-11-02 MED ORDER — ZOLPIDEM TARTRATE 5 MG PO TABS: 5.0000 mg | ORAL_TABLET | Freq: Every evening | ORAL | Status: DC | PRN

## 2018-11-02 MED ORDER — LACTATED RINGERS IV SOLN
INTRAVENOUS | Status: DC
  Administered 2018-11-02: 16:00:00 via INTRAVENOUS

## 2018-11-02 MED ORDER — SOD CITRATE-CITRIC ACID 500-334 MG/5ML PO SOLN: 30.0000 mL | ORAL | Status: DC | PRN

## 2018-11-02 MED ORDER — DIPHENHYDRAMINE HCL 25 MG PO CAPS: 25.0000 mg | ORAL_CAPSULE | Freq: Four times a day (QID) | ORAL | Status: DC | PRN

## 2018-11-02 MED ORDER — PRENATAL MULTIVITAMIN CH
1.0000 | ORAL_TABLET | Freq: Every day | ORAL | Status: DC
  Administered 2018-11-03: 1 via ORAL
  Filled 2018-11-02: qty 1

## 2018-11-02 MED ORDER — SODIUM CHLORIDE (PF) 0.9 % IJ SOLN
INTRAMUSCULAR | Status: DC | PRN
  Administered 2018-11-02: 12 mL/h via EPIDURAL

## 2018-11-02 MED ORDER — LACTATED RINGERS IV SOLN: 500.0000 mL | INTRAVENOUS | Status: DC | PRN

## 2018-11-02 MED ORDER — LIDOCAINE HCL (PF) 1 % IJ SOLN
INTRAMUSCULAR | Status: DC | PRN
  Administered 2018-11-02 (×2): 5 mL via EPIDURAL

## 2018-11-02 MED ORDER — BENZOCAINE-MENTHOL 20-0.5 % EX AERO
1.0000 "application " | INHALATION_SPRAY | CUTANEOUS | Status: DC | PRN
  Administered 2018-11-02: 1 via TOPICAL
  Filled 2018-11-02: qty 56

## 2018-11-02 MED ORDER — LACTATED RINGERS IV SOLN: 500.0000 mL | Freq: Once | INTRAVENOUS | Status: DC

## 2018-11-02 MED ORDER — FLEET ENEMA 7-19 GM/118ML RE ENEM: 1.0000 | ENEMA | RECTAL | Status: DC | PRN

## 2018-11-02 MED ORDER — IBUPROFEN 600 MG PO TABS
600.0000 mg | ORAL_TABLET | Freq: Four times a day (QID) | ORAL | Status: DC
  Administered 2018-11-02 – 2018-11-04 (×7): 600 mg via ORAL
  Filled 2018-11-02 (×7): qty 1

## 2018-11-02 MED ORDER — FENTANYL-BUPIVACAINE-NACL 0.5-0.125-0.9 MG/250ML-% EP SOLN
12.0000 mL/h | EPIDURAL | Status: DC | PRN
  Filled 2018-11-02: qty 250

## 2018-11-02 MED ORDER — SIMETHICONE 80 MG PO CHEW: 80.0000 mg | CHEWABLE_TABLET | ORAL | Status: DC | PRN

## 2018-11-02 MED ORDER — WITCH HAZEL-GLYCERIN EX PADS: 1.0000 "application " | MEDICATED_PAD | CUTANEOUS | Status: DC | PRN

## 2018-11-02 MED ORDER — SENNOSIDES-DOCUSATE SODIUM 8.6-50 MG PO TABS
2.0000 | ORAL_TABLET | ORAL | Status: DC
  Administered 2018-11-02 – 2018-11-03 (×2): 2 via ORAL
  Filled 2018-11-02 (×2): qty 2

## 2018-11-02 MED ORDER — FENTANYL CITRATE (PF) 100 MCG/2ML IJ SOLN: 50.0000 ug | INTRAMUSCULAR | Status: DC | PRN

## 2018-11-02 NOTE — H&P (Signed)
Allison Fischer is a 33 y.o. female presenting for SOL.  CTX started at 12 noon today.  SROM with light meconium while in MAU around 1430 today.  Antepartum course uncomplicated.  GBS positive.  OB History    Gravida  2   Para  1   Term  1   Preterm      AB      Living  1     SAB      TAB      Ectopic      Multiple  0   Live Births  1          Past Medical History:  Diagnosis Date  . Allergy   . Medical history non-contributory    Past Surgical History:  Procedure Laterality Date  . EYE SURGERY    . LAPAROSCOPIC APPENDECTOMY N/A 04/21/2017   Procedure: APPENDECTOMY LAPAROSCOPIC;  Surgeon: Kinsinger, De Blanch, MD;  Location: [facility];  Service: General;  Laterality: N/A;   Family History: family history includes Cancer in her maternal grandmother; Heart attack in her maternal grandfather and paternal grandfather; Hypertension in her maternal grandmother. Social History:  reports that she has never smoked. She has never used smokeless tobacco. She reports previous alcohol use. She reports that she does not use drugs.     Maternal Diabetes: No Genetic Screening: Normal Maternal Ultrasounds/Referrals: Normal Fetal Ultrasounds or other Referrals:  None Maternal Substance Abuse:  No Significant Maternal Medications:  None Significant Maternal Lab Results:  Lab values include: Group B Strep positive Other Comments:  None  ROS Maternal Medical History:  Reason for admission: Rupture of membranes and contractions.   Contractions: Onset was 3-5 hours ago.   Frequency: regular.   Perceived severity is moderate.    Fetal activity: Perceived fetal activity is normal.   Last perceived fetal movement was within the past hour.    Prenatal complications: no prenatal complications Prenatal Complications - Diabetes: none.    Dilation: 10 Effacement (%): 100 Station: Plus 2 Exam by:: Dr. Langston Masker Blood pressure 115/83, pulse (!) 107, temperature 98.8 F (37.1 C),  temperature source Oral, resp. rate 20, height 5' 2.5" (1.588 m), weight 104.3 kg, last menstrual period 02/07/2018, SpO2 100 %, unknown if currently breastfeeding. Maternal Exam:  Uterine Assessment: Contraction strength is moderate.  Contraction frequency is regular.   Abdomen: Patient reports no abdominal tenderness. Fundal height is c/w dates.   Estimated fetal weight is 7#4.   Fetal presentation: vertex  Introitus: Normal vulva. Amniotic fluid character: meconium stained.  Pelvis: adequate for delivery.   Cervix: Cervix evaluated by digital exam.     Physical Exam  Constitutional: She is oriented to person, place, and time. She appears well-developed and well-nourished.  GI: Soft. There is no rebound and no guarding.  Genitourinary:    Vulva normal.   Neurological: She is alert and oriented to person, place, and time.  Skin: Skin is warm and dry.  Psychiatric: She has a normal mood and affect. Her behavior is normal.    Prenatal labs: ABO, Rh:  O pos Antibody:  negative Rubella:  Immune RPR:   NR HBsAg:   Negative HIV:   Negative  GBS:   Positive  Assessment/Plan: 32yo G2P1001 at 38 weeks with SOL -Clinda for GBS with rapid labor anticipated  -Uncomplicated precipitous SVD.  See delivery note for details   Mitchel Honour 11/02/2018, 4:43 PM

## 2018-11-02 NOTE — Anesthesia Procedure Notes (Signed)
Epidural Patient location during procedure: OB Start time: 11/02/2018 4:06 PM End time: 11/02/2018 4:15 PM  Staffing Anesthesiologist: Achille Rich, MD Performed: anesthesiologist   Preanesthetic Checklist Completed: patient identified, site marked, pre-op evaluation, timeout performed, IV checked, risks and benefits discussed and monitors and equipment checked  Epidural Patient position: sitting Prep: DuraPrep Patient monitoring: heart rate, cardiac monitor, continuous pulse ox and blood pressure Approach: midline Location: L2-L3 Injection technique: LOR saline  Needle:  Needle type: Tuohy  Needle gauge: 17 G Needle length: 9 cm Needle insertion depth: 7 cm Catheter type: closed end flexible Catheter size: 19 Gauge Catheter at skin depth: 13 cm Test dose: negative and Other  Assessment Events: blood not aspirated, injection not painful, no injection resistance and negative IV test  Additional Notes Informed consent obtained prior to proceeding including risk of failure, 1% risk of PDPH, risk of minor discomfort and bruising.  Discussed rare but serious complications including epidural abscess, permanent nerve injury, epidural hematoma.  Discussed alternatives to epidural analgesia and patient desires to proceed.  Timeout performed pre-procedure verifying patient name, procedure, and platelet count.  Patient tolerated procedure well. Reason for block:procedure for pain

## 2018-11-02 NOTE — MAU Note (Signed)
Contractions started early in morning.  At arrived - in MAU bed- water broke at 0226- light mec stain fluid noted.  Baby moved some this morning. No bleeding.

## 2018-11-02 NOTE — Lactation Note (Signed)
This note was copied from a baby's chart. Lactation Consultation Note Baby 7 hrs old. Mom states BF fine.  Baby sleeping. Mom has everted short shaft nipples, compressible nipples.  Hand expression w/easy flow colostrum expressed. Mom BF her 33 yr old for 13 months w/o difficulty. Newborn behavior, STS, I&O, breast massage discussed.  Mom speaks good [place]. LC had Lactation brochure in [place], mom states English was fine. Mom encouraged to call for assistance or questions.  Patient Name: Allison Fischer [id number] Date: 11/02/2018 Reason for consult: Initial assessment;Early term 37-38.6wks   Maternal Data Has patient been taught Hand Expression?: Yes Does the patient have breastfeeding experience prior to this delivery?: Yes  Feeding Feeding Type: Breast Fed  LATCH Score Latch: Repeated attempts needed to sustain latch, nipple held in mouth throughout feeding, stimulation needed to elicit sucking reflex.  Audible Swallowing: A few with stimulation  Type of Nipple: Everted at rest and after stimulation  Comfort (Breast/Nipple): Soft / non-tender  Hold (Positioning): Assistance needed to correctly position infant at breast and maintain latch.  LATCH Score: 7  Interventions Interventions: Breast feeding basics reviewed;Hand express  Lactation Tools Discussed/Used     Consult Status Consult Status: Follow-up Date: 11/03/18 Follow-up type: In-patient    AFRIN, STASTNY 11/02/2018, 11:47 PM

## 2018-11-02 NOTE — Anesthesia Preprocedure Evaluation (Signed)
Anesthesia Evaluation  Patient identified by MRN, date of birth, ID band Patient awake    Reviewed: Allergy & Precautions, H&P , NPO status , Patient's Chart, lab work & pertinent test results  Airway Mallampati: II   Neck ROM: full    Dental   Pulmonary neg pulmonary ROS,    breath sounds clear to auscultation       Cardiovascular negative cardio ROS   Rhythm:regular Rate:Normal     Neuro/Psych    GI/Hepatic   Endo/Other    Renal/GU      Musculoskeletal   Abdominal   Peds  Hematology   Anesthesia Other Findings   Reproductive/Obstetrics (+) Pregnancy                             Anesthesia Physical Anesthesia Plan  ASA: I  Anesthesia Plan: Epidural   Post-op Pain Management:    Induction: Intravenous  PONV Risk Score and Plan: 2 and Treatment may vary due to age or medical condition  Airway Management Planned: Natural Airway  Additional Equipment:   Intra-op Plan:   Post-operative Plan:   Informed Consent: I have reviewed the patients History and Physical, chart, labs and discussed the procedure including the risks, benefits and alternatives for the proposed anesthesia with the patient or authorized representative who has indicated his/her understanding and acceptance.       Plan Discussed with: Anesthesiologist  Anesthesia Plan Comments:         Anesthesia Quick Evaluation  

## 2018-11-03 LAB — CBC
HCT: 34.1 % — ABNORMAL LOW (ref 36.0–46.0)
Hemoglobin: 11.2 g/dL — ABNORMAL LOW (ref 12.0–15.0)
MCH: 27.9 pg (ref 26.0–34.0)
MCHC: 32.8 g/dL (ref 30.0–36.0)
MCV: 84.8 fL (ref 80.0–100.0)
Platelets: 269 10*3/uL (ref 150–400)
RBC: 4.02 MIL/uL (ref 3.87–5.11)
RDW: 13.5 % (ref 11.5–15.5)
WBC: 10.5 10*3/uL (ref 4.0–10.5)
nRBC: 0 % (ref 0.0–0.2)

## 2018-11-03 LAB — ABO/RH: ABO/RH(D): O POS

## 2018-11-03 NOTE — Anesthesia Postprocedure Evaluation (Signed)
Anesthesia Post Note  Patient: Allison Fischer  Procedure(s) Performed: AN AD HOC LABOR EPIDURAL     Patient location during evaluation: Mother Baby Anesthesia Type: Epidural Level of consciousness: awake and alert Pain management: pain level controlled Vital Signs Assessment: post-procedure vital signs reviewed and stable Respiratory status: spontaneous breathing, nonlabored ventilation and respiratory function stable Cardiovascular status: stable Postop Assessment: no headache, no backache and epidural receding Anesthetic complications: no    Last Vitals:  Vitals:   11/02/18 2313 11/03/18 0946  BP:  117/73  Pulse: 87 86  Resp: 18 16  Temp: 37.4 C 36.7 C  SpO2:      Last Pain:  Vitals:   11/03/18 1212  TempSrc:   PainSc: 2    Pain Goal: Patients Stated Pain Goal: 2 (11/02/18 1603)                 Viggo Perko

## 2018-11-03 NOTE — Progress Notes (Signed)
Patient doing well. No complaints.  BP 108/61 (BP Location: Left Arm)   Pulse 87   Temp 99.3 F (37.4 C) (Oral)   Resp 18   Ht 5' 2.5" (1.588 m)   Wt 104.3 kg   LMP 02/07/2018   SpO2 100%   Breastfeeding Unknown   BMI 41.40 kg/m  Results for orders placed or performed during the hospital encounter of 11/02/18 (from the past 24 hour(s))  SARS Coronavirus 2 (CEPHEID - Performed in [facility] hospital lab), Hosp Order     Status: None   Collection Time: 11/02/18  3:06 PM  Result Value Ref Range   SARS Coronavirus 2 NEGATIVE NEGATIVE  Type and screen [facility]     Status: None   Collection Time: 11/02/18  3:13 PM  Result Value Ref Range   ABO/RH(D) O POS    Antibody Screen NEG    Sample Expiration      11/05/2018,2359 Performed at [facility], [address], [place], [place] [address]   ABO/Rh     Status: None   Collection Time: 11/02/18  3:13 PM  Result Value Ref Range   ABO/RH(D)      O POS Performed at [facility], [address], [place], [place] [address]   CBC     Status: None   Collection Time: 11/02/18  3:43 PM  Result Value Ref Range   WBC 9.8 4.0 - 10.5 K/uL   RBC 4.43 3.87 - 5.11 MIL/uL   Hemoglobin 12.3 12.0 - 15.0 g/dL   HCT 12.8 78.6 - 76.7 %   MCV 85.1 80.0 - 100.0 fL   MCH 27.8 26.0 - 34.0 pg   MCHC 32.6 30.0 - 36.0 g/dL   RDW 20.9 47.0 - 96.2 %   Platelets 309 150 - 400 K/uL   nRBC 0.0 0.0 - 0.2 %  CBC     Status: Abnormal   Collection Time: 11/03/18  5:32 AM  Result Value Ref Range   WBC 10.5 4.0 - 10.5 K/uL   RBC 4.02 3.87 - 5.11 MIL/uL   Hemoglobin 11.2 (L) 12.0 - 15.0 g/dL   HCT 83.6 (L) 62.9 - 47.6 %   MCV 84.8 80.0 - 100.0 fL   MCH 27.9 26.0 - 34.0 pg   MCHC 32.8 30.0 - 36.0 g/dL   RDW 54.6 50.3 - 54.6 %   Platelets 269 150 - 400 K/uL   nRBC 0.0 0.0 - 0.2 %   Abdomen is soft and non tender  PPD # 1  Routine care Circ today Discharge home tomorrow

## 2018-11-04 MED ORDER — IBUPROFEN 600 MG PO TABS: 600.0000 mg | ORAL_TABLET | Freq: Four times a day (QID) | ORAL | 0 refills | Status: DC

## 2018-11-04 NOTE — Discharge Summary (Signed)
Obstetric Discharge Summary Reason for Admission: onset of labor Prenatal Procedures: ultrasound Intrapartum Procedures: spontaneous vaginal delivery Postpartum Procedures: none Complications-Operative and Postpartum: none Hemoglobin  Date Value Ref Range Status  11/03/2018 11.2 (L) 12.0 - 15.0 g/dL Final   HCT  Date Value Ref Range Status  11/03/2018 34.1 (L) 36.0 - 46.0 % Final    Physical Exam:  General: alert and cooperative Lochia: appropriate Uterine Fundus: firm Incision: n/a DVT Evaluation: No evidence of DVT seen on physical exam.  Discharge Diagnoses: Term Pregnancy-delivered  Discharge Information: Date: 11/04/2018 Activity: pelvic rest Diet: routine Medications: PNV and Ibuprofen Condition: stable Instructions: refer to practice specific booklet Discharge to: home Follow-up Information    [facility], [facility] Schedule an appointment as soon as possible for a visit in 6 week(s).   Contact information: [address] [place] [address] [phone]           Newborn Data: Live born female  Birth Weight: 6 lb 8.1 oz (2951 g) APGAR: 9, 9  Newborn Delivery   Birth date/time:  11/02/2018 16:30:00 Delivery type:  Vaginal, Spontaneous     Home with mother.  Allison Fischer 11/04/2018, 8:55 AM

## 2018-11-04 NOTE — Progress Notes (Signed)
Pt reports some mild tingling in right anterior thigh. Symptoms are improved compared to yesterday.  No pain or swelling.  No CP/SOB  Exam negative homans.  Negative for edema  Reassurance given.  If symptoms do not continue to improve, will further evaluate.  No evidence of DVT on exam today

## 2018-11-04 NOTE — Lactation Note (Signed)
This note was copied from a baby's chart. Lactation Consultation Note  Patient Name: Allison Fischer Date: 11/04/2018   Marda Stalker, RN notified me that the Mom has declined a lactation visit for today. Wendall Papa, RN was made aware that there is no documentation of stool in the last 24 hours.    Lurline Hare [facility] 11/04/2018, 9:44 AM

## 2018-11-05 LAB — RPR: RPR Ser Ql: NONREACTIVE

## 2019-09-14 ENCOUNTER — Encounter (HOSPITAL_COMMUNITY): Payer: Self-pay | Admitting: Emergency Medicine

## 2019-09-14 ENCOUNTER — Ambulatory Visit (INDEPENDENT_AMBULATORY_CARE_PROVIDER_SITE_OTHER)
Admission: RE | Admit: 2019-09-14 | Discharge: 2019-09-14 | Disposition: A | Discharge status: Other Acute Inpatient Hospital | Payer: BC Managed Care – PPO | Source: Ambulatory Visit

## 2019-09-14 ENCOUNTER — Ambulatory Visit (HOSPITAL_COMMUNITY)
Admission: EM | Admit: 2019-09-14 | Discharge: 2019-09-14 | Disposition: A | Discharge status: Home / Self Care | Payer: BC Managed Care – PPO | Attending: Family Medicine | Admitting: Family Medicine

## 2019-09-14 ENCOUNTER — Other Ambulatory Visit: Payer: Self-pay

## 2019-09-14 DIAGNOSIS — R5383 Other fatigue: Secondary | ICD-10-CM

## 2019-09-14 DIAGNOSIS — R5381 Other malaise: Secondary | ICD-10-CM

## 2019-09-14 DIAGNOSIS — J020 Streptococcal pharyngitis: Secondary | ICD-10-CM | POA: Insufficient documentation

## 2019-09-14 DIAGNOSIS — Z20822 Contact with and (suspected) exposure to covid-19: Secondary | ICD-10-CM | POA: Insufficient documentation

## 2019-09-14 DIAGNOSIS — Z880 Allergy status to penicillin: Secondary | ICD-10-CM | POA: Diagnosis not present

## 2019-09-14 DIAGNOSIS — J029 Acute pharyngitis, unspecified: Secondary | ICD-10-CM | POA: Diagnosis present

## 2019-09-14 LAB — POCT RAPID STREP A: Streptococcus, Group A Screen (Direct): POSITIVE — AB

## 2019-09-14 MED ORDER — AZITHROMYCIN 250 MG PO TABS: 250.0000 mg | ORAL_TABLET | Freq: Every day | ORAL | 0 refills | Status: DC

## 2019-09-14 NOTE — ED Provider Notes (Addendum)
[facility]   [id number] 09/14/19 Arrival Time: 1455  ASSESSMENT & PLAN:  1. Strep pharyngitis     No signs of peritonsillar abscess. Discussed.  To begin: Meds ordered this encounter  Medications  . azithromycin (ZITHROMAX) 250 MG tablet    Sig: Take 1 tablet (250 mg total) by mouth daily. Take first 2 tablets together, then 1 every day until finished.    Dispense:  6 tablet    Refill:  0    Results for orders placed or performed during the hospital encounter of 09/14/19  POCT rapid strep A [facility])  Result Value Ref Range   Streptococcus, Group A Screen (Direct) POSITIVE (A) NEGATIVE   Labs Reviewed  POCT RAPID STREP A - Abnormal; Notable for the following components:      Result Value   Streptococcus, Group A Screen (Direct) POSITIVE (*)    All other components within normal limits  SARS CORONAVIRUS 2 (TAT 6-24 HRS)   COVID testing sent. OTC analgesics and throat care as needed  Instructed to finish full course of antibiotics. Will follow up if not showing significant improvement over the next 24-48 hours.    Discharge Instructions     You have been tested for COVID-19 today. If your test returns positive, you will receive a phone call from [facility] regarding your results. Negative test results are not called. Both positive and negative results area always visible on MyChart. If you do not have a MyChart account, sign up instructions are provided in your discharge papers. Please do not hesitate to contact us should you have questions or concerns.    Reviewed expectations re: course of current medical issues. Questions answered. Outlined signs and symptoms indicating need for more acute intervention. Patient verbalized understanding. After Visit Summary given.   SUBJECTIVE:  Allison Fischer is a 34 y.o. female who reports a sore throat. Describes as pain with swallowing. Onset gradual beginning approx 3 d ago. Symptoms have gradually  worsened since beginning; without voice changes. Does report neck soreness. No respiratory symptoms. Overall decreased PO intake. No specific alleviating factors. Fever reported: believed to be present, temp not taken. No neck pain or swelling. No associated nausea, vomiting, or abdominal pain. Known sick contacts: one child with mild diarrhea; 10 mo. Recent travel: none. OTC treatment: Tylenol without much relief.  Seen via telemedicine visit today; note reviewed by me.  OBJECTIVE:  Vitals:   09/14/19 1516  BP: 118/69  Pulse: (!) 105  Resp: 16  Temp: 99.6 F (37.6 C)  TempSrc: Oral  SpO2: 100%     General appearance: alert; no distress HEENT: throat with moderate erythema and with tonsillar hypertrophy; uvula is midline Neck: supple with FROM CV: reg; slight tachycardia noted Lungs: speaks full sentences without difficulty; unlabored respirations Abd: soft; non-tender Skin: reveals no rash; warm and dry Psychological: alert and cooperative; normal mood and affect  Allergies  Allergen Reactions  . Penicillins Rash    Has patient had a PCN reaction causing immediate rash, facial/tongue/throat swelling, SOB or lightheadedness with hypotension: Yes Has patient had a PCN reaction causing severe rash involving mucus membranes or skin necrosis: No Has patient had a PCN reaction that required hospitalization No Has patient had a PCN reaction occurring within the last 10 years: No If all of the above answers are "NO", then may proceed with Cephalosporin use.   . Tape Rash    Past Medical History:  Diagnosis Date  . Allergy   .  Medical history non-contributory    Social History   Socioeconomic History  . Marital status: Married    Spouse name: Not on file  . Number of children: Not on file  . Years of education: Not on file  . Highest education level: Not on file  Occupational History  . Not on file  Tobacco Use  . Smoking status: Never Smoker  . Smokeless tobacco:  Never Used  Substance and Sexual Activity  . Alcohol use: Not Currently    Alcohol/week: 0.0 standard drinks  . Drug use: No  . Sexual activity: Not on file  Other Topics Concern  . Not on file  Social History Narrative  . Not on file   Social Determinants of Health   Financial Resource Strain:   . Difficulty of Paying Living Expenses:   Food Insecurity:   . Worried About Charity fundraiser in the Last Year:   . Arboriculturist in the Last Year:   Transportation Needs:   . Film/video editor (Medical):   Marland Kitchen Lack of Transportation (Non-Medical):   Physical Activity:   . Days of Exercise per Week:   . Minutes of Exercise per Session:   Stress:   . Feeling of Stress :   Social Connections:   . Frequency of Communication with Friends and Family:   . Frequency of Social Gatherings with Friends and Family:   . Attends Religious Services:   . Active Member of Clubs or Organizations:   . Attends Archivist Meetings:   Marland Kitchen Marital Status:   Intimate Partner Violence:   . Fear of Current or Ex-Partner:   . Emotionally Abused:   Marland Kitchen Physically Abused:   . Sexually Abused:    Family History  Problem Relation Age of Onset  . Hypertension Maternal Grandmother   . Cancer Maternal Grandmother   . Heart attack Maternal Grandfather   . Heart attack Paternal Reymundo Poll, MD 09/14/19 5027    Vanessa Kick, MD 09/14/19 [phone]

## 2019-09-14 NOTE — Discharge Instructions (Signed)
You have been tested for COVID-19 today. °If your test returns positive, you will receive a phone call from [facility] regarding your results. °Negative test results are not called. °Both positive and negative results area always visible on MyChart. °If you do not have a MyChart account, sign up instructions are provided in your discharge papers. °Please do not hesitate to contact us should you have questions or concerns. ° °

## 2019-09-14 NOTE — ED Provider Notes (Signed)
Virtual Visit via Video Note:  Allison Fischer  initiated request for Telemedicine visit with [facility] team. I connected with Allison Fischer  on 09/14/2019 at 1:30 PM  for a synchronized telemedicine visit using a video enabled HIPPA compliant telemedicine application. I verified that I am speaking with Allison Fischer  using two identifiers. Allison Eagles, Allison Fischer  was physically located in a [facility] Urgent care site and Allison Fischer was located at a different location.   The limitations of evaluation and management by telemedicine as well as the availability of in-person appointments were discussed. Patient was informed that she  may incur a bill ( including co-pay) for this virtual visit encounter. Allison Fischer  expressed understanding and gave verbal consent to proceed with virtual visit.     History of Present Illness:Allison Fischer  is a 34 y.o. female presents with 3 day hx of sore throat, fatigue, neck pain, mild subjective fever.  Received COVID vaccine 09/09/2019, had soreness of her arm which has improved. Now her baby is having some mild diarrhea, her son is also having throat pain. Her husband had COVID vaccine on same day and has similar symptoms. Patient has tried APAP with minimal relief. Has taken her temp but has not had objective fever. Complete ROS as below.   Review of Systems  Constitutional: Positive for fever and malaise/fatigue.  HENT: Positive for congestion (feels hot sensation from her nose). Negative for ear pain, sinus pain and sore throat.   Eyes: Positive for pain (burning of her eyes). Negative for blurred vision, double vision, photophobia, discharge and redness.  Respiratory: Negative for cough, hemoptysis, shortness of breath and wheezing.   Cardiovascular: Negative for chest pain.  Gastrointestinal: Negative for abdominal pain, diarrhea, nausea and vomiting.  Genitourinary: Negative for dysuria, flank pain and hematuria.  Musculoskeletal: Positive for myalgias and  neck pain.  Skin: Negative for rash.  Neurological: Negative for dizziness, weakness and headaches.  Psychiatric/Behavioral: Negative for depression and substance abuse.    Current Outpatient Medications  Medication Sig Dispense Refill  . acetaminophen (TYLENOL) 500 MG tablet Take 2 tablets (1,000 mg total) by mouth every 8 (eight) hours. 30 tablet 0  . famotidine (PEPCID) 20 MG tablet Take 20 mg by mouth 2 (two) times daily.    Marland Kitchen ibuprofen (ADVIL) 600 MG tablet Take 1 tablet (600 mg total) by mouth every 6 (six) hours. 30 tablet 0  . Multiple Vitamin (MULTIVITAMIN WITH MINERALS) TABS tablet Take 1 tablet by mouth daily.     No current facility-administered medications for this encounter.     Allergies  Allergen Reactions  . Penicillins Rash    Has patient had a PCN reaction causing immediate rash, facial/tongue/throat swelling, SOB or lightheadedness with hypotension: Yes Has patient had a PCN reaction causing severe rash involving mucus membranes or skin necrosis: No Has patient had a PCN reaction that required hospitalization No Has patient had a PCN reaction occurring within the last 10 years: No If all of the above answers are "NO", then may proceed with Cephalosporin use.   . Tape Rash     Past Medical History:  Diagnosis Date  . Allergy   . Medical history non-contributory     Past Surgical History:  Procedure Laterality Date  . EYE SURGERY    . LAPAROSCOPIC APPENDECTOMY N/A 04/21/2017   Procedure: APPENDECTOMY LAPAROSCOPIC;  Surgeon: Kinsinger, Arta Bruce, MD;  Location: [facility];  Service: General;  Laterality: N/A;      Observations/Objective:  Physical Exam Constitutional:      General: She is not in acute distress.    Appearance: Normal appearance. She is well-developed. She is not ill-appearing, toxic-appearing or diaphoretic.  Eyes:     Extraocular Movements: Extraocular movements intact.  Pulmonary:     Effort: Pulmonary effort is normal.  Neurological:      General: No focal deficit present.     Mental Status: She is alert and oriented to person, place, and time.  Psychiatric:        Mood and Affect: Mood normal.        Behavior: Behavior normal.        Thought Content: Thought content normal.        Judgment: Judgment normal.     Assessment and Plan:  1. Malaise and fatigue    Recommended patient have an in person evaluation given her symptoms that and the fact that both of her children have similar symptoms and did not receive the COVID-19 vaccine.  Patient is agreeable to this, states that she will come in to be seen.   Follow Up Instructions:    I discussed the assessment and treatment plan with the patient. The patient was provided an opportunity to ask questions and all were answered. The patient agreed with the plan and demonstrated an understanding of the instructions.   The patient was advised to call back or seek an in-person evaluation if the symptoms worsen or if the condition fails to improve as anticipated.  I provided 10 minutes of non-face-to-face time during this encounter.    Wallis Bamberg, Allison Fischer  09/14/2019 1:30 PM         Wallis Bamberg, Allison Fischer 09/14/19 1340

## 2019-09-14 NOTE — ED Triage Notes (Signed)
PT got her COVID vaccine Friday.  She developed sore throat Saturday. Today she has headache, sore throat, body aches.   She had a virtual visit today and was asked to come in.   Both of their children are also sick and did not get the shot.

## 2019-09-15 LAB — SARS CORONAVIRUS 2 (TAT 6-24 HRS): SARS Coronavirus 2: NEGATIVE

## 2019-10-03 ENCOUNTER — Encounter (HOSPITAL_COMMUNITY): Payer: Self-pay

## 2019-10-03 ENCOUNTER — Other Ambulatory Visit: Payer: Self-pay

## 2019-10-03 ENCOUNTER — Ambulatory Visit (HOSPITAL_COMMUNITY)
Admission: EM | Admit: 2019-10-03 | Discharge: 2019-10-03 | Disposition: A | Discharge status: Home / Self Care | Payer: BC Managed Care – PPO | Attending: Family Medicine | Admitting: Family Medicine

## 2019-10-03 DIAGNOSIS — J029 Acute pharyngitis, unspecified: Secondary | ICD-10-CM

## 2019-10-03 LAB — POCT RAPID STREP A: Streptococcus, Group A Screen (Direct): NEGATIVE

## 2019-10-03 NOTE — Discharge Instructions (Signed)
You may use over the counter ibuprofen or acetaminophen as needed.  For a sore throat, over the counter products such as [employer] Peroxyl Mouth Sore Rinse or Chloraseptic Sore Throat Spray may provide some temporary relief. Your rapid strep test was negative today. We have sent your throat swab for culture and will let you know of any results requiring treatment.

## 2019-10-03 NOTE — ED Triage Notes (Signed)
C/o sore throat and cough. Reports she got her second covid shot on Friday. Requesting testing for strep throat.

## 2019-10-03 NOTE — ED Provider Notes (Signed)
Allison Fischer   [id number] 10/03/19 Arrival Time: 0827  ASSESSMENT & PLAN:  1. Sore throat     No signs of peritonsillar abscess. Tolerating PO. Discussed.   Results for orders placed or performed during the hospital encounter of 10/03/19  POCT rapid strep A [facility])  Result Value Ref Range   Streptococcus, Group A Screen (Direct) NEGATIVE NEGATIVE   Labs Reviewed  CULTURE, GROUP A STREP [facility])  POCT RAPID STREP A    OTC analgesics and throat care as needed    Discharge Instructions      You may use over the counter ibuprofen or acetaminophen as needed.   For a sore throat, over the counter products such as [employer] Peroxyl Mouth Sore Rinse or Chloraseptic Sore Throat Spray may provide some temporary relief.  Your rapid strep test was negative today. We have sent your throat swab for culture and will let you know of any results requiring treatment.    Reviewed expectations re: course of current medical issues. Questions answered. Outlined signs and symptoms indicating need for more acute intervention. Patient verbalized understanding. After Visit Summary given.   SUBJECTIVE:  Allison Fischer is a 34 y.o. female who reports a sore throat. Describes as pain with swallowing. Treated for strep approx 3 w ago; improved. Onset abrupt beginning yesterday. Symptoms have gradually worsened since beginning; without voice changes. No respiratory symptoms. Normal PO intake but reports discomfort with swallowing. No specific alleviating factors. Fever reported: none No neck pain or swelling. No associated nausea, vomiting, or abdominal pain. Known sick contacts: none. Recent travel: none. OTC treatment: ibuprofen with little relief.    OBJECTIVE:  Vitals:   10/03/19 0908  BP: 126/88  Pulse: 99  Resp: 16  Temp: 99.2 F (37.3 C)  TempSrc: Oral  SpO2: 99%     General appearance: alert; no distress HEENT: throat with mild erythema and cobblestoning; no  tonsil enlargement; uvula is midline Neck: supple with FROM; no lymphadenopathy Abd: soft; non-tender Skin: reveals no rash; warm and dry Psychological: alert and cooperative; normal mood and affect  Allergies  Allergen Reactions  . Penicillins Rash    Has patient had a PCN reaction causing immediate rash, facial/tongue/throat swelling, SOB or lightheadedness with hypotension: Yes Has patient had a PCN reaction causing severe rash involving mucus membranes or skin necrosis: No Has patient had a PCN reaction that required hospitalization No Has patient had a PCN reaction occurring within the last 10 years: No If all of the above answers are "NO", then may proceed with Cephalosporin use.   . Tape Rash    Past Medical History:  Diagnosis Date  . Allergy   . Medical history non-contributory    Social History   Socioeconomic History  . Marital status: Married    Spouse name: Not on file  . Number of children: Not on file  . Years of education: Not on file  . Highest education level: Not on file  Occupational History  . Not on file  Tobacco Use  . Smoking status: Never Smoker  . Smokeless tobacco: Never Used  Substance and Sexual Activity  . Alcohol use: Not Currently    Alcohol/week: 0.0 standard drinks  . Drug use: No  . Sexual activity: Not on file  Other Topics Concern  . Not on file  Social History Narrative  . Not on file   Social Determinants of Health   Financial Resource Strain:   . Difficulty of Paying Living Expenses:  Food Insecurity:   . Worried About Programme researcher, broadcasting/film/video in the Last Year:   . Barista in the Last Year:   Transportation Needs:   . Freight forwarder (Medical):   Marland Kitchen Lack of Transportation (Non-Medical):   Physical Activity:   . Days of Exercise per Week:   . Minutes of Exercise per Session:   Stress:   . Feeling of Stress :   Social Connections:   . Frequency of Communication with Friends and Family:   . Frequency of  Social Gatherings with Friends and Family:   . Attends Religious Services:   . Active Member of Clubs or Organizations:   . Attends Banker Meetings:   Marland Kitchen Marital Status:   Intimate Partner Violence:   . Fear of Current or Ex-Partner:   . Emotionally Abused:   Marland Kitchen Physically Abused:   . Sexually Abused:    Family History  Problem Relation Age of Onset  . Hypertension Maternal Grandmother   . Cancer Maternal Grandmother   . Heart attack Maternal Grandfather   . Heart attack Paternal Glynda Jaeger, MD 10/03/19 [phone]

## 2019-10-05 LAB — CULTURE, GROUP A STREP (THRC)

## 2020-03-15 NOTE — H&P (Signed)
Allison Fischer is an 34 y.o. female G2P2 who desires permanent sterility.    Pertinent Gynecological History: Menses: flow is moderate Bleeding: regular Contraception: vasectomy DES exposure: unknown Blood transfusions: none Sexually transmitted diseases: no past history Previous GYN Procedures: none  Last mammogram: n/a Date: n/a Last pap: normal Date: 12/02/17 OB History: G2, P2   Menstrual History: Menarche age: n/a No LMP recorded.    Past Medical History:  Diagnosis Date  . Allergy   . Medical history non-contributory     Past Surgical History:  Procedure Laterality Date  . EYE SURGERY    . LAPAROSCOPIC APPENDECTOMY N/A 04/21/2017   Procedure: APPENDECTOMY LAPAROSCOPIC;  Surgeon: Kinsinger, De Blanch, MD;  Location: [facility] OR;  Service: General;  Laterality: N/A;    Family History  Problem Relation Age of Onset  . Hypertension Maternal Grandmother   . Cancer Maternal Grandmother   . Heart attack Maternal Grandfather   . Heart attack Paternal Grandfather     Social History:  reports that she has never smoked. She has never used smokeless tobacco. She reports previous alcohol use. She reports that she does not use drugs.  Allergies:  Allergies  Allergen Reactions  . Penicillins Rash    Has patient had a PCN reaction causing immediate rash, facial/tongue/throat swelling, SOB or lightheadedness with hypotension: Yes Has patient had a PCN reaction causing severe rash involving mucus membranes or skin necrosis: No Has patient had a PCN reaction that required hospitalization No Has patient had a PCN reaction occurring within the last 10 years: No If all of the above answers are "NO", then may proceed with Cephalosporin use.   . Tape Rash    No medications prior to admission.    Review of Systems  unknown if currently breastfeeding. Physical Exam Constitutional:      Appearance: Normal appearance.  HENT:     Head: Normocephalic and atraumatic.  Cardiovascular:      Rate and Rhythm: Normal rate and regular rhythm.  Pulmonary:     Effort: Pulmonary effort is normal.     Breath sounds: Normal breath sounds.  Abdominal:     Palpations: Abdomen is soft.  Musculoskeletal:     Cervical back: Normal range of motion.  Skin:    General: Skin is warm and dry.  Neurological:     Mental Status: She is alert.  Psychiatric:        Mood and Affect: Mood normal.        Behavior: Behavior normal.     No results found for this or any previous visit (from the past 24 hour(s)).  No results found.  Assessment/Plan: 34yo G2P2 with desire for sterility -L/S BTL-patient is counseled re: risk of bleeding, infection, scarring, and damage to surrounding structures.  She is informed of the increased risk of ectopic pregnancy, failure and regret.  She is counseled re: steps of the procedure itself as well as postoperative expectations.  All questions were answered and the patient wishes to proceed.  Mitchel Honour 03/15/2020, 3:52 PM

## 2020-03-20 ENCOUNTER — Other Ambulatory Visit: Payer: Self-pay

## 2020-03-20 ENCOUNTER — Encounter (HOSPITAL_BASED_OUTPATIENT_CLINIC_OR_DEPARTMENT_OTHER): Payer: Self-pay | Admitting: Obstetrics & Gynecology

## 2020-03-20 NOTE — Progress Notes (Signed)
Spoke w/ via phone for pre-op interview---pt Lab needs dos----  Cbc, urine preg, T & S             Lab results------none COVID test ------03-21-2020 1100 am Arrive at -------530 am  NPO after MN NO Solid Food.  Clear liquids from MN until---430 am then npo Medications to take morning of surgery -----none Diabetic medication -----n/a Patient Special Instructions -----none Pre-Op special Istructions -----none Patient verbalized understanding of instructions that were given at this phone interview. Patient denies shortness of breath, chest pain, fever, cough at this phone interview.

## 2020-03-21 ENCOUNTER — Other Ambulatory Visit (HOSPITAL_COMMUNITY)
Admission: RE | Admit: 2020-03-21 | Discharge: 2020-03-21 | Disposition: A | Discharge status: Home / Self Care | Payer: BC Managed Care – PPO | Source: Ambulatory Visit | Attending: Obstetrics & Gynecology | Admitting: Obstetrics & Gynecology

## 2020-03-21 DIAGNOSIS — Z20822 Contact with and (suspected) exposure to covid-19: Secondary | ICD-10-CM | POA: Diagnosis not present

## 2020-03-21 LAB — SARS CORONAVIRUS 2 (TAT 6-24 HRS): SARS Coronavirus 2: NEGATIVE

## 2020-03-22 NOTE — Anesthesia Preprocedure Evaluation (Addendum)
Anesthesia Evaluation  Patient identified by MRN, date of birth, ID band Patient awake    Reviewed: Allergy & Precautions, H&P , NPO status , Patient's Chart, lab work & pertinent test results  Airway Mallampati: I       Dental no notable dental hx.    Pulmonary neg pulmonary ROS,    Pulmonary exam normal        Cardiovascular negative cardio ROS Normal cardiovascular exam     Neuro/Psych negative neurological ROS  negative psych ROS   GI/Hepatic negative GI ROS, Neg liver ROS,   Endo/Other  negative endocrine ROS  Renal/GU negative Renal ROS  negative genitourinary   Musculoskeletal   Abdominal (+) + obese,   Peds  Hematology negative hematology ROS (+)   Anesthesia Other Findings   Reproductive/Obstetrics negative OB ROS                            Anesthesia Physical  Anesthesia Plan  ASA: II  Anesthesia Plan: General   Post-op Pain Management:    Induction: Intravenous  PONV Risk Score and Plan: 4 or greater and Ondansetron, Dexamethasone and Midazolam  Airway Management Planned: Oral ETT  Additional Equipment: None  Intra-op Plan:   Post-operative Plan: Extubation in OR  Informed Consent: I have reviewed the patients History and Physical, chart, labs and discussed the procedure including the risks, benefits and alternatives for the proposed anesthesia with the patient or authorized representative who has indicated his/her understanding and acceptance.     Dental advisory given  Plan Discussed with: CRNA  Anesthesia Plan Comments:        Anesthesia Quick Evaluation

## 2020-03-23 ENCOUNTER — Encounter (HOSPITAL_BASED_OUTPATIENT_CLINIC_OR_DEPARTMENT_OTHER)
Admission: RE | Disposition: A | Discharge status: Home / Self Care | Payer: Self-pay | Source: Home / Self Care | Attending: Obstetrics & Gynecology

## 2020-03-23 ENCOUNTER — Ambulatory Visit (HOSPITAL_BASED_OUTPATIENT_CLINIC_OR_DEPARTMENT_OTHER)
Admission: RE | Admit: 2020-03-23 | Discharge: 2020-03-23 | Disposition: A | Discharge status: Home / Self Care | Payer: BC Managed Care – PPO | Attending: Obstetrics & Gynecology | Admitting: Obstetrics & Gynecology

## 2020-03-23 ENCOUNTER — Ambulatory Visit (HOSPITAL_BASED_OUTPATIENT_CLINIC_OR_DEPARTMENT_OTHER): Payer: BC Managed Care – PPO | Admitting: Anesthesiology

## 2020-03-23 ENCOUNTER — Encounter (HOSPITAL_BASED_OUTPATIENT_CLINIC_OR_DEPARTMENT_OTHER): Payer: Self-pay | Admitting: Obstetrics & Gynecology

## 2020-03-23 DIAGNOSIS — Z880 Allergy status to penicillin: Secondary | ICD-10-CM | POA: Insufficient documentation

## 2020-03-23 DIAGNOSIS — N979 Female infertility, unspecified: Secondary | ICD-10-CM

## 2020-03-23 DIAGNOSIS — Z302 Encounter for sterilization: Secondary | ICD-10-CM | POA: Insufficient documentation

## 2020-03-23 DIAGNOSIS — Z888 Allergy status to other drugs, medicaments and biological substances status: Secondary | ICD-10-CM | POA: Insufficient documentation

## 2020-03-23 HISTORY — PX: LAPAROSCOPIC TUBAL LIGATION: SHX1937

## 2020-03-23 LAB — TYPE AND SCREEN
ABO/RH(D): O POS
Antibody Screen: NEGATIVE

## 2020-03-23 LAB — CBC
HCT: 39.2 % (ref 36.0–46.0)
Hemoglobin: 13 g/dL (ref 12.0–15.0)
MCH: 29.3 pg (ref 26.0–34.0)
MCHC: 33.2 g/dL (ref 30.0–36.0)
MCV: 88.3 fL (ref 80.0–100.0)
Platelets: 273 10*3/uL (ref 150–400)
RBC: 4.44 MIL/uL (ref 3.87–5.11)
RDW: 12.3 % (ref 11.5–15.5)
WBC: 6 10*3/uL (ref 4.0–10.5)
nRBC: 0 % (ref 0.0–0.2)

## 2020-03-23 LAB — POCT PREGNANCY, URINE: Preg Test, Ur: NEGATIVE

## 2020-03-23 SURGERY — LIGATION, FALLOPIAN TUBE, LAPAROSCOPIC
Anesthesia: General | Laterality: Bilateral

## 2020-03-23 MED ORDER — DEXAMETHASONE SODIUM PHOSPHATE 10 MG/ML IJ SOLN
INTRAMUSCULAR | Status: AC
  Filled 2020-03-23: qty 1

## 2020-03-23 MED ORDER — ONDANSETRON HCL 4 MG/2ML IJ SOLN
INTRAMUSCULAR | Status: DC | PRN
  Administered 2020-03-23: 4 mg via INTRAVENOUS

## 2020-03-23 MED ORDER — LACTATED RINGERS IV SOLN: INTRAVENOUS | Status: DC

## 2020-03-23 MED ORDER — LIDOCAINE 2% (20 MG/ML) 5 ML SYRINGE
INTRAMUSCULAR | Status: DC | PRN
  Administered 2020-03-23: 100 mg via INTRAVENOUS

## 2020-03-23 MED ORDER — IBUPROFEN 200 MG PO TABS: 800.0000 mg | ORAL_TABLET | Freq: Four times a day (QID) | ORAL | 0 refills | Status: DC | PRN

## 2020-03-23 MED ORDER — KETOROLAC TROMETHAMINE 30 MG/ML IJ SOLN: 30.0000 mg | Freq: Once | INTRAMUSCULAR | Status: DC | PRN

## 2020-03-23 MED ORDER — SUGAMMADEX SODIUM 200 MG/2ML IV SOLN
INTRAVENOUS | Status: DC | PRN
  Administered 2020-03-23: 360 mg via INTRAVENOUS

## 2020-03-23 MED ORDER — ONDANSETRON HCL 4 MG/2ML IJ SOLN: 4.0000 mg | Freq: Once | INTRAMUSCULAR | Status: DC | PRN

## 2020-03-23 MED ORDER — PROPOFOL 10 MG/ML IV BOLUS
INTRAVENOUS | Status: AC
  Filled 2020-03-23: qty 40

## 2020-03-23 MED ORDER — OXYCODONE HCL 5 MG/5ML PO SOLN: 5.0000 mg | Freq: Once | ORAL | Status: AC | PRN

## 2020-03-23 MED ORDER — PROPOFOL 10 MG/ML IV BOLUS
INTRAVENOUS | Status: DC | PRN
  Administered 2020-03-23: 150 mg via INTRAVENOUS

## 2020-03-23 MED ORDER — ONDANSETRON HCL 4 MG/2ML IJ SOLN
INTRAMUSCULAR | Status: AC
  Filled 2020-03-23: qty 2

## 2020-03-23 MED ORDER — FENTANYL CITRATE (PF) 100 MCG/2ML IJ SOLN
INTRAMUSCULAR | Status: AC
  Filled 2020-03-23: qty 2

## 2020-03-23 MED ORDER — GENTAMICIN SULFATE 40 MG/ML IJ SOLN: 5.0000 mg/kg | Freq: Once | INTRAVENOUS | Status: DC

## 2020-03-23 MED ORDER — SUGAMMADEX SODIUM 500 MG/5ML IV SOLN
INTRAVENOUS | Status: AC
  Filled 2020-03-23: qty 5

## 2020-03-23 MED ORDER — OXYCODONE HCL 5 MG PO TABS
5.0000 mg | ORAL_TABLET | Freq: Once | ORAL | Status: AC | PRN
  Administered 2020-03-23: 5 mg via ORAL

## 2020-03-23 MED ORDER — ACETAMINOPHEN 10 MG/ML IV SOLN
1000.0000 mg | Freq: Once | INTRAVENOUS | Status: AC
  Administered 2020-03-23: 1000 mg via INTRAVENOUS

## 2020-03-23 MED ORDER — KETOROLAC TROMETHAMINE 30 MG/ML IJ SOLN
INTRAMUSCULAR | Status: DC | PRN
  Administered 2020-03-23: 30 mg via INTRAVENOUS

## 2020-03-23 MED ORDER — BUPIVACAINE HCL (PF) 0.25 % IJ SOLN
INTRAMUSCULAR | Status: DC | PRN
  Administered 2020-03-23: 5 mL

## 2020-03-23 MED ORDER — OXYCODONE HCL 5 MG PO TABS
5.0000 mg | ORAL_TABLET | Freq: Once | ORAL | Status: AC
  Administered 2020-03-23: 5 mg via ORAL

## 2020-03-23 MED ORDER — KETOROLAC TROMETHAMINE 30 MG/ML IJ SOLN
INTRAMUSCULAR | Status: AC
  Filled 2020-03-23: qty 1

## 2020-03-23 MED ORDER — POVIDONE-IODINE 10 % EX SWAB: 2.0000 "application " | Freq: Once | CUTANEOUS | Status: DC

## 2020-03-23 MED ORDER — MIDAZOLAM HCL 2 MG/2ML IJ SOLN
INTRAMUSCULAR | Status: DC | PRN
  Administered 2020-03-23: 2 mg via INTRAVENOUS

## 2020-03-23 MED ORDER — DEXAMETHASONE SODIUM PHOSPHATE 10 MG/ML IJ SOLN
INTRAMUSCULAR | Status: DC | PRN
  Administered 2020-03-23 (×2): 5 mg via INTRAVENOUS

## 2020-03-23 MED ORDER — OXYCODONE HCL 5 MG PO TABS
ORAL_TABLET | ORAL | Status: AC
  Filled 2020-03-23: qty 1

## 2020-03-23 MED ORDER — FENTANYL CITRATE (PF) 100 MCG/2ML IJ SOLN: 25.0000 ug | INTRAMUSCULAR | Status: DC | PRN

## 2020-03-23 MED ORDER — ACETAMINOPHEN 10 MG/ML IV SOLN
INTRAVENOUS | Status: AC
  Filled 2020-03-23: qty 100

## 2020-03-23 MED ORDER — ACETAMINOPHEN 160 MG/5ML PO SOLN: 325.0000 mg | ORAL | Status: DC | PRN

## 2020-03-23 MED ORDER — FENTANYL CITRATE (PF) 100 MCG/2ML IJ SOLN
INTRAMUSCULAR | Status: DC | PRN
Start: 2020-03-23 — End: 2020-03-23
  Administered 2020-03-23 (×4): 50 ug via INTRAVENOUS

## 2020-03-23 MED ORDER — ROCURONIUM BROMIDE 10 MG/ML (PF) SYRINGE
PREFILLED_SYRINGE | INTRAVENOUS | Status: DC | PRN
  Administered 2020-03-23: 60 mg via INTRAVENOUS

## 2020-03-23 MED ORDER — CLINDAMYCIN PHOSPHATE 900 MG/50ML IV SOLN: 900.0000 mg | Freq: Once | INTRAVENOUS | Status: DC

## 2020-03-23 MED ORDER — MIDAZOLAM HCL 2 MG/2ML IJ SOLN
INTRAMUSCULAR | Status: AC
  Filled 2020-03-23: qty 2

## 2020-03-23 MED ORDER — MEPERIDINE HCL 25 MG/ML IJ SOLN: 6.2500 mg | INTRAMUSCULAR | Status: DC | PRN

## 2020-03-23 MED ORDER — ACETAMINOPHEN 325 MG PO TABS: 325.0000 mg | ORAL_TABLET | ORAL | Status: DC | PRN

## 2020-03-23 MED ORDER — LIDOCAINE 2% (20 MG/ML) 5 ML SYRINGE
INTRAMUSCULAR | Status: AC
  Filled 2020-03-23: qty 5

## 2020-03-23 MED ORDER — ROCURONIUM BROMIDE 10 MG/ML (PF) SYRINGE
PREFILLED_SYRINGE | INTRAVENOUS | Status: AC
  Filled 2020-03-23: qty 10

## 2020-03-23 MED ORDER — OXYCODONE-ACETAMINOPHEN 5-325 MG PO TABS
1.0000 | ORAL_TABLET | ORAL | 0 refills | Status: DC | PRN
Start: 2020-03-23 — End: 2020-08-10

## 2020-03-23 SURGICAL SUPPLY — 42 items
ADH SKN CLS APL DERMABOND .7 (GAUZE/BANDAGES/DRESSINGS) ×1
APL SKNCLS STERI-STRIP NONHPOA (GAUZE/BANDAGES/DRESSINGS)
BENZOIN TINCTURE PRP APPL 2/3 (GAUZE/BANDAGES/DRESSINGS) IMPLANT
CANISTER SUCT 3000ML PPV (MISCELLANEOUS) IMPLANT
CATH ROBINSON RED A/P 16FR (CATHETERS) ×2 IMPLANT
CLIP FILSHIE TUBAL LIGA STRL (Clip) ×2 IMPLANT
COVER MAYO STAND STRL (DRAPES) ×2 IMPLANT
COVER WAND RF STERILE (DRAPES) ×2 IMPLANT
DERMABOND ADVANCED (GAUZE/BANDAGES/DRESSINGS) ×1
DERMABOND ADVANCED .7 DNX12 (GAUZE/BANDAGES/DRESSINGS) ×1 IMPLANT
DRSG COVADERM PLUS 2X2 (GAUZE/BANDAGES/DRESSINGS) IMPLANT
DRSG OPSITE POSTOP 3X4 (GAUZE/BANDAGES/DRESSINGS) ×2 IMPLANT
DRSG TEGADERM 4X4.75 (GAUZE/BANDAGES/DRESSINGS) IMPLANT
DURAPREP 26ML APPLICATOR (WOUND CARE) ×2 IMPLANT
GAUZE 4X4 16PLY RFD (DISPOSABLE) ×2 IMPLANT
GLOVE BIO SURGEON STRL SZ 6 (GLOVE) ×2 IMPLANT
GLOVE BIOGEL PI IND STRL 6 (GLOVE) ×2 IMPLANT
GLOVE BIOGEL PI INDICATOR 6 (GLOVE) ×2
GOWN STRL REUS W/ TWL LRG LVL3 (GOWN DISPOSABLE) ×3 IMPLANT
GOWN STRL REUS W/TWL LRG LVL3 (GOWN DISPOSABLE) ×6
KIT TURNOVER CYSTO (KITS) ×2 IMPLANT
NEEDLE INSUFFLATION 120MM (ENDOMECHANICALS) ×2 IMPLANT
NS IRRIG 500ML POUR BTL (IV SOLUTION) ×2 IMPLANT
PACK LAPAROSCOPY BASIN (CUSTOM PROCEDURE TRAY) ×2 IMPLANT
PACK TRENDGUARD 450 HYBRID PRO (MISCELLANEOUS) ×1 IMPLANT
PAD OB MATERNITY 4.3X12.25 (PERSONAL CARE ITEMS) ×2 IMPLANT
PENCIL SMOKE EVACUATOR (MISCELLANEOUS) IMPLANT
SCISSORS LAP 5X35 DISP (ENDOMECHANICALS) IMPLANT
SET SUCTION IRRIG HYDROSURG (IRRIGATION / IRRIGATOR) IMPLANT
SET TUBE SMOKE EVAC HIGH FLOW (TUBING) ×2 IMPLANT
SPONGE GAUZE 2X2 8PLY STRL LF (GAUZE/BANDAGES/DRESSINGS) IMPLANT
STRIP CLOSURE SKIN 1/2X4 (GAUZE/BANDAGES/DRESSINGS) IMPLANT
STRIP CLOSURE SKIN 1/4X4 (GAUZE/BANDAGES/DRESSINGS) IMPLANT
SUT MNCRL AB 3-0 PS2 18 (SUTURE) ×2 IMPLANT
SUT VICRYL 0 UR6 27IN ABS (SUTURE) ×2 IMPLANT
TOWEL OR 17X26 10 PK STRL BLUE (TOWEL DISPOSABLE) ×2 IMPLANT
TRENDGUARD 450 HYBRID PRO PACK (MISCELLANEOUS) ×2
TROCAR BLADELESS OPT 5 100 (ENDOMECHANICALS) IMPLANT
TROCAR XCEL NON-BLD 11X100MML (ENDOMECHANICALS) ×2 IMPLANT
TUBE CONNECTING 12X1/4 (SUCTIONS) IMPLANT
WARMER LAPAROSCOPE (MISCELLANEOUS) ×2 IMPLANT
WATER STERILE IRR 500ML POUR (IV SOLUTION) ×2 IMPLANT

## 2020-03-23 NOTE — Anesthesia Procedure Notes (Signed)
Procedure Name: Intubation Date/Time: 03/23/2020 7:30 AM Performed by: Suan Halter, CRNA Pre-anesthesia Checklist: Patient identified, Emergency Drugs available, Suction available and Patient being monitored Patient Re-evaluated:Patient Re-evaluated prior to induction Oxygen Delivery Method: Circle system utilized Preoxygenation: Pre-oxygenation with 100% oxygen Induction Type: IV induction Ventilation: Mask ventilation without difficulty Laryngoscope Size: Mac and 3 Grade View: Grade I Tube type: Oral Tube size: 7.0 mm Number of attempts: 1 Airway Equipment and Method: Stylet and Oral airway Placement Confirmation: ETT inserted through vocal cords under direct vision,  positive ETCO2 and breath sounds checked- equal and bilateral Secured at: 21 cm Tube secured with: Tape Dental Injury: Teeth and Oropharynx as per pre-operative assessment

## 2020-03-23 NOTE — Progress Notes (Signed)
No change to H&P.  Jahmya Onofrio, DO 

## 2020-03-23 NOTE — Anesthesia Postprocedure Evaluation (Signed)
Anesthesia Post Note  Patient: Allison Fischer  Procedure(s) Performed: LAPAROSCOPIC TUBAL LIGATION with filshie clips (Bilateral )     Patient location during evaluation: Phase II Anesthesia Type: General Level of consciousness: awake and sedated Pain management: pain level controlled Vital Signs Assessment: post-procedure vital signs reviewed and stable Respiratory status: spontaneous breathing Cardiovascular status: stable Postop Assessment: no apparent nausea or vomiting Anesthetic complications: no   No complications documented.  Last Vitals:  Vitals:   03/23/20 0600 03/23/20 0815  BP: 114/66 110/76  Pulse: 72 80  Resp: 15 (!) 8  Temp: 37.3 C 36.5 C  SpO2: 100% 99%    Last Pain:  Vitals:   03/23/20 0815  TempSrc:   PainSc: 5                  John F Scharlene Corn

## 2020-03-23 NOTE — Op Note (Signed)
Allison Fischer 03/23/2020  PREOPERATIVE DIAGNOSIS:  Desire for sterility  POSTOPERATIVE DIAGNOSIS:  SAA  PROCEDURE:  Laparoscopic Bilateral Tubal Ligation with Filshie Clips  ANESTHESIA:  General endotracheal  COMPLICATIONS:  None immediate.  ESTIMATED BLOOD LOSS:  Less than 20 ml.  INDICATIONS: 34 y.o. G2P2 with desired sterility.       FINDINGS:  Normal uterus, bilateral fallopian tubes and ovaries.  Uterine perforation with manipulator placement.   TECHNIQUE:  The patient was taken to the operating room where general anesthesia was obtained without difficulty.  She was then placed in the dorsal lithotomy position and prepared and draped in sterile fashion.  After an adequate timeout was performed, a bivalved speculum was then placed in the patient's vagina, and the anterior lip of cervix grasped with the single-tooth tenaculum.  The hulka clip was advanced into the uterus.  The speculum was removed from the vagina.  Attention was then turned to the patient's abdomen where a 10-mm skin incision was made on the umbilical fold.  The Veress needle was carefully introduced into the peritoneal cavity through the abdominal wall.  Intraperitoneal placement was confirmed by drop in intraabdominal pressure with insufflation of carbon dioxide gas.  Adequate pneumoperitoneum was obtained, and the 10 mm trocar was then advanced without difficulty into the abdomen where intraabdominal placement was confirmed by the operative laparoscope. A small defect at the uterine fundus was noted with blood clot overlying; no active bleeding.  This was location of perforation from uterine manipulator.  Filshie clip was advanced and placed across the left tube ensuring the clip completely encompassed the entire tube diameter approximately 3 cm from the cornual portion of the tube.  The same was performed on the right fallopian tube. Hemostasis was observed.  All instruments were removed from the abdomen.  The  infraumbilical fascial incision was closed with 0 vicryl in a figure of eight stitch. Skin incisions were closed with 3-0 monocryl in a subcuticular fashion.    The uterine manipulator and the tenaculum were removed from the vagina without complications. The patient tolerated the procedure well.  Sponge, lap, and needle counts were correct times two.  The patient was then taken to the recovery room awake, extubated and in stable  in stable condition.  Given uterine perforation, patient will receive Gentamicin and Clindamycin postoperatively x 1 dose.

## 2020-03-23 NOTE — Transfer of Care (Signed)
Immediate Anesthesia Transfer of Care Note  Patient: Allison Fischer  Procedure(s) Performed: Procedure(s) (LRB): LAPAROSCOPIC TUBAL LIGATION with filshie clips (Bilateral)  Patient Location: PACU  Anesthesia Type: General  Level of Consciousness: awake, oriented, sedated and patient cooperative  Airway & Oxygen Therapy: Patient Spontanous Breathing and Patient connected to face mask oxygen  Post-op Assessment: Report given to PACU RN and Post -op Vital signs reviewed and stable  Post vital signs: Reviewed and stable  Complications: No apparent anesthesia complications  Last Vitals:  Vitals Value Taken Time  BP 110/76 03/23/20 0815  Temp    Pulse 80 03/23/20 0815  Resp 9 03/23/20 0815  SpO2 95 % 03/23/20 0815  Vitals shown include unvalidated device data.  Last Pain:  Vitals:   03/23/20 0600  TempSrc: Oral  PainSc: 0-No pain      Patients Stated Pain Goal: 5 (03/23/20 0600)  Complications: No complications documented.

## 2020-03-23 NOTE — Discharge Instructions (Signed)
Call MD for T>100.4, heavy vaginal bleeding, severe abdominal pain, intractable nausea and/or vomiting, or respiratory distress.    Post Anesthesia Home Care Instructions  Activity: Get plenty of rest for the remainder of the day. A responsible individual must stay with you for 24 hours following the procedure.  For the next 24 hours, DO NOT: -Drive a car -Advertising copywriter -Drink alcoholic beverages -Take any medication unless instructed by your physician -Make any legal decisions or sign important papers.  Meals: Start with liquid foods such as gelatin or soup. Progress to regular foods as tolerated. Avoid greasy, spicy, heavy foods. If nausea and/or vomiting occur, drink only clear liquids until the nausea and/or vomiting subsides. Call your physician if vomiting continues.  Special Instructions/Symptoms: Your throat may feel dry or sore from the anesthesia or the breathing tube placed in your throat during surgery. If this causes discomfort, gargle with warm salt water. The discomfort should disappear within 24 hours.  If you had a scopolamine patch placed behind your ear for the management of post- operative nausea and/or vomiting:  1. The medication in the patch is effective for 72 hours, after which it should be removed.  Wrap patch in a tissue and discard in the trash. Wash hands thoroughly with soap and water. 2. You may remove the patch earlier than 72 hours if you experience unpleasant side effects which may include dry mouth, dizziness or visual disturbances. 3. Avoid touching the patch. Wash your hands with soap and water after contact with the patch.     Laparoscopic Tubal Ligation, Care After This sheet gives you information about how to care for yourself after your procedure. Your health care provider may also give you more specific instructions. If you have problems or questions, contact your health care provider. What can I expect after the procedure? After the  procedure, it is common to have:  A sore throat.  Discomfort in your shoulder.  Mild discomfort or cramping in your abdomen.  Gas pains.  Pain or soreness in the area where the surgical incision was made.  A bloated feeling.  Tiredness.  Nausea.  Vomiting. Follow these instructions at home: Medicines  Take over-the-counter and prescription medicines only as told by your health care provider.  Do not take aspirin because it can cause bleeding.  Ask your health care provider if the medicine prescribed to you: ? Requires you to avoid driving or using heavy machinery. ? Can cause constipation. You may need to take actions to prevent or treat constipation, such as:  Drink enough fluid to keep your urine pale yellow.  Take over-the-counter or prescription medicines.  Eat foods that are high in fiber, such as beans, whole grains, and fresh fruits and vegetables.  Limit foods that are high in fat and processed sugars, such as fried or sweet foods. Incision care      Follow instructions from your health care provider about how to take care of your incision. Make sure you: ? Wash your hands with soap and water before and after you change your bandage (dressing). If soap and water are not available, use hand sanitizer. ? Change your dressing as told by your health care provider. ? Leave stitches (sutures), skin glue, or adhesive strips in place. These skin closures may need to stay in place for 2 weeks or longer. If adhesive strip edges start to loosen and curl up, you may trim the loose edges. Do not remove adhesive strips completely unless your health care provider  tells you to do that.  Check your incision area every day for signs of infection. Check for: ? Redness, swelling, or pain. ? Fluid or blood. ? Warmth. ? Pus or a bad smell. Activity  Rest as told by your health care provider.  Avoid sitting for a long time without moving. Get up to take short walks every 1-2  hours. This is important to improve blood flow and breathing. Ask for help if you feel weak or unsteady.  Return to your normal activities as told by your health care provider. Ask your health care provider what activities are safe for you. General instructions  Do not take baths, swim, or use a hot tub until your health care provider approves. Ask your health care provider if you may take showers. You may only be allowed to take sponge baths.  Have someone help you with your daily household tasks for the first few days.  Keep all follow-up visits as told by your health care provider. This is important. Contact a health care provider if:  You have redness, swelling, or pain around your incision.  Your incision feels warm to the touch.  You have pus or a bad smell coming from your incision.  The edges of your incision break open after the sutures have been removed.  Your pain does not improve after 2-3 days.  You have a rash.  You repeatedly become dizzy or light-headed.  Your pain medicine is not helping. Get help right away if you:  Have a fever.  Faint.  Have increasing pain in your abdomen.  Have severe pain in one or both of your shoulders.  Have fluid or blood coming from your sutures or from your vagina.  Have shortness of breath or difficulty breathing.  Have chest pain or leg pain.  Have ongoing nausea, vomiting, or diarrhea. Summary  After the procedure, it is common to have mild discomfort or cramping in your abdomen.  Take over-the-counter and prescription medicines only as told by your health care provider.  Watch for symptoms that should prompt you to call your health care provider.  Keep all follow-up visits as told by your health care provider. This is important. This information is not intended to replace advice given to you by your health care provider. Make sure you discuss any questions you have with your health care provider. Document Revised:  11/16/2018 Document Reviewed: 05/04/2018 Elsevier Patient Education  2020 [employer].

## 2020-03-26 ENCOUNTER — Encounter (HOSPITAL_BASED_OUTPATIENT_CLINIC_OR_DEPARTMENT_OTHER): Payer: Self-pay | Admitting: Obstetrics & Gynecology

## 2020-08-10 ENCOUNTER — Ambulatory Visit
Admission: EM | Admit: 2020-08-10 | Discharge: 2020-08-10 | Disposition: A | Discharge status: Home / Self Care | Payer: BC Managed Care – PPO | Attending: Emergency Medicine | Admitting: Emergency Medicine

## 2020-08-10 ENCOUNTER — Other Ambulatory Visit: Payer: Self-pay

## 2020-08-10 DIAGNOSIS — S161XXA Strain of muscle, fascia and tendon at neck level, initial encounter: Secondary | ICD-10-CM

## 2020-08-10 DIAGNOSIS — M546 Pain in thoracic spine: Secondary | ICD-10-CM

## 2020-08-10 DIAGNOSIS — M545 Low back pain, unspecified: Secondary | ICD-10-CM | POA: Diagnosis not present

## 2020-08-10 MED ORDER — TIZANIDINE HCL 4 MG PO TABS: 2.0000 mg | ORAL_TABLET | Freq: Four times a day (QID) | ORAL | 0 refills | Status: DC | PRN

## 2020-08-10 MED ORDER — NAPROXEN 500 MG PO TABS: 500.0000 mg | ORAL_TABLET | Freq: Two times a day (BID) | ORAL | 0 refills | Status: DC
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
## 2020-08-10 NOTE — Discharge Instructions (Signed)
Naprosyn twice daily with food Tizanidine to supplement at home/bedtime, this is a muscle relaxer, may cause drowsiness, do not drive or work after taking Alternate ice and heat Gentle stretching Follow-up if not improving or worsening
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
## 2020-09-25 ENCOUNTER — Encounter: Payer: Self-pay | Admitting: Nurse Practitioner

## 2020-09-25 ENCOUNTER — Other Ambulatory Visit: Payer: Self-pay

## 2020-09-25 ENCOUNTER — Ambulatory Visit: Payer: BC Managed Care – PPO | Admitting: Nurse Practitioner

## 2020-09-25 VITALS — BP 93/64 | HR 90 | Temp 99.5°F | Ht 62.5 in | Wt 205.1 lb

## 2020-09-25 DIAGNOSIS — R635 Abnormal weight gain: Secondary | ICD-10-CM

## 2020-09-25 DIAGNOSIS — Z7689 Persons encountering health services in other specified circumstances: Secondary | ICD-10-CM | POA: Diagnosis not present

## 2020-09-25 DIAGNOSIS — Z6836 Body mass index (BMI) 36.0-36.9, adult: Secondary | ICD-10-CM | POA: Diagnosis not present

## 2020-09-25 NOTE — Progress Notes (Signed)
New Patient Office Visit  Subjective:  Patient ID: Allison Fischer, female    DOB: 1986-05-26  Age: 35 y.o. MRN: [id number]  CC:  Chief Complaint  Patient presents with  . New Patient (Initial Visit)    HPI Allison Fischer presents to establish care with new primary care provider. She has been seeing her GYN provider for all well-woman related exams. She did, recently, have a tubal ligation. She states that he biggest concern is her weight. States that she is cooking healthy food, she is going to the gym daily. She has bumped up her cardio routines to 30 minutes a few days per week. She is also taking pump classes. She is concerned because she is not losing weight despite all of the efforts she is putting forth into changing lifestyle. She made the changes a bit over a year ago and has not been able to lose her "baby" weight. She does have a family history of hypothyroid, prediabetes, and obesity. She states that she has not had routine labs done in a long time, includes her thyroid functions.  The patient denies chest pain, chest pressure, shortness of breath, wheezing, or palpitations.  She denies nausea, vomiting, or changes in bowel and bladder habits.   Past Medical History:  Diagnosis Date  . Allergy   . Medical history non-contributory     Past Surgical History:  Procedure Laterality Date  . APPENDECTOMY N/A    Phreesia 09/24/2020  . EYE SURGERY Bilateral 12/2007   cornea sx to correct vision  . LAPAROSCOPIC APPENDECTOMY N/A 04/21/2017   Procedure: APPENDECTOMY LAPAROSCOPIC;  Surgeon: Kinsinger, De Blanch, MD;  Location: [facility] OR;  Service: General;  Laterality: N/A;  . LAPAROSCOPIC TUBAL LIGATION Bilateral 03/23/2020   Procedure: LAPAROSCOPIC TUBAL LIGATION with filshie clips;  Surgeon: Mitchel Honour, DO;  Location: [facility];  Service: Gynecology;  Laterality: Bilateral;  . TUBAL LIGATION N/A    Phreesia 09/24/2020    Family History  Problem Relation Age of Onset   . High Cholesterol Mother   . High blood pressure Father   . Hypertension Maternal Grandmother   . Cancer Maternal Grandmother   . Heart attack Maternal Grandfather   . High blood pressure Maternal Grandfather   . High blood pressure Paternal Grandmother   . Heart attack Paternal Grandfather   . High blood pressure Paternal Grandfather     Social History   Socioeconomic History  . Marital status: Married    Spouse name: Not on file  . Number of children: Not on file  . Years of education: Not on file  . Highest education level: Not on file  Occupational History  . Not on file  Tobacco Use  . Smoking status: Never Smoker  . Smokeless tobacco: Never Used  Vaping Use  . Vaping Use: Never used  Substance and Sexual Activity  . Alcohol use: Yes    Alcohol/week: 0.0 standard drinks    Comment: occ weekends  . Drug use: No  . Sexual activity: Yes  Other Topics Concern  . Not on file  Social History Narrative  . Not on file   Social Determinants of Health   Financial Resource Strain: Not on file  Food Insecurity: Not on file  Transportation Needs: Not on file  Physical Activity: Not on file  Stress: Not on file  Social Connections: Not on file  Intimate Partner Violence: Not on file    ROS Review of Systems  Constitutional: Negative for activity change,  chills and fever.       Patient having a difficult time losing weight despite strong efforts to eat healthy and increase exercise in daily routine.   HENT: Negative for congestion, postnasal drip, rhinorrhea, sinus pressure and sinus pain.   Eyes: Negative.   Respiratory: Negative for cough, chest tightness, shortness of breath and wheezing.   Cardiovascular: Negative for chest pain and palpitations.  Gastrointestinal: Negative for constipation, diarrhea, nausea and vomiting.  Endocrine: Negative.   Genitourinary: Negative.   Musculoskeletal: Negative.  Negative for back pain and myalgias.  Skin: Negative for  rash.  Allergic/Immunologic: Negative.   Neurological: Negative for dizziness, weakness and headaches.  Hematological: Negative.   Psychiatric/Behavioral: Negative.  The patient is not nervous/anxious.   All other systems reviewed and are negative.   Objective:   Today's Vitals   09/25/20 1553  BP: 93/64  Pulse: 90  Temp: 99.5 F (37.5 C)  SpO2: 100%  Weight: 205 lb 1.6 oz (93 kg)  Height: 5' 2.5" (1.588 m)   Body mass index is 36.92 kg/m.  Physical Exam Vitals and nursing note reviewed.  Constitutional:      Appearance: Normal appearance. She is well-developed.  HENT:     Head: Normocephalic and atraumatic.     Nose: Nose normal.     Mouth/Throat:     Mouth: Mucous membranes are moist.     Pharynx: Oropharynx is clear.  Eyes:     Extraocular Movements: Extraocular movements intact.     Conjunctiva/sclera: Conjunctivae normal.     Pupils: Pupils are equal, round, and reactive to light.  Neck:     Thyroid: No thyroid mass, thyromegaly or thyroid tenderness.  Cardiovascular:     Rate and Rhythm: Normal rate and regular rhythm.     Pulses: Normal pulses.     Heart sounds: Normal heart sounds.  Pulmonary:     Effort: Pulmonary effort is normal.     Breath sounds: Normal breath sounds.  Abdominal:     Palpations: Abdomen is soft.  Musculoskeletal:        General: Normal range of motion.     Cervical back: Normal range of motion and neck supple.  Skin:    General: Skin is warm and dry.     Capillary Refill: Capillary refill takes less than 2 seconds.  Neurological:     General: No focal deficit present.     Mental Status: She is alert and oriented to person, place, and time.  Psychiatric:        Mood and Affect: Mood normal.        Behavior: Behavior normal.        Thought Content: Thought content normal.        Judgment: Judgment normal.     Assessment & Plan:  1. Encounter to establish care Appointment today to establish new primary care provider. Will  check labs prior to next visit and discuss  2. Abnormal weight gain Will check fasting blood work with thyroid panel prior to next visit.   3. BMI 36.0-36.9,adult Continue with healthy diet and regular exercise. Will discuss medication options to help with weight management at next visit, lab results pending.   Problem List Items Addressed This Visit      Other   Encounter to establish care - Primary   Abnormal weight gain   BMI 36.0-36.9,adult      Outpatient Encounter Medications as of 09/25/2020  Medication Sig  . [DISCONTINUED] acetaminophen (TYLENOL) 500 MG tablet  Take 2 tablets (1,000 mg total) by mouth every 8 (eight) hours. (Patient not taking: Reported on 09/25/2020)  . [DISCONTINUED] naproxen (NAPROSYN) 500 MG tablet Take 1 tablet (500 mg total) by mouth 2 (two) times daily. (Patient not taking: Reported on 09/25/2020)  . [DISCONTINUED] tiZANidine (ZANAFLEX) 4 MG tablet Take 0.5-1 tablets (2-4 mg total) by mouth every 6 (six) hours as needed for muscle spasms. (Patient not taking: Reported on 09/25/2020)   No facility-administered encounter medications on file as of 09/25/2020.   Time spent with the patient was approximately 30 minutes. This time included reviewing progress notes, labs, imaging studies, and discussing plan for follow up.   Follow-up: Return in about 6 weeks (around 11/06/2020) for general physical - will need FBW with addition of free T4 and t3.   Carlean Jews, NP

## 2020-09-25 NOTE — Patient Instructions (Signed)
Calorie Counting for Weight Loss Calories are units of energy. Your body needs a certain number of calories from food to keep going throughout the day. When you eat or drink more calories than your body needs, your body stores the extra calories mostly as fat. When you eat or drink fewer calories than your body needs, your body burns fat to get the energy it needs. Calorie counting means keeping track of how many calories you eat and drink each day. Calorie counting can be helpful if you need to lose weight. If you eat fewer calories than your body needs, you should lose weight. Ask your health care provider what a healthy weight is for you. For calorie counting to work, you will need to eat the right number of calories each day to lose a healthy amount of weight per week. A dietitian can help you figure out how many calories you need in a day and will suggest ways to reach your calorie goal.  A healthy amount of weight to lose each week is usually 1-2 lb (0.5-0.9 kg). This usually means that your daily calorie intake should be reduced by 500-750 calories.  Eating 1,200-1,500 calories a day can help most women lose weight.  Eating 1,500-1,800 calories a day can help most men lose weight. What do I need to know about calorie counting? Work with your health care provider or dietitian to determine how many calories you should get each day. To meet your daily calorie goal, you will need to:  Find out how many calories are in each food that you would like to eat. Try to do this before you eat.  Decide how much of the food you plan to eat.  Keep a food log. Do this by writing down what you ate and how many calories it had. To successfully lose weight, it is important to balance calorie counting with a healthy lifestyle that includes regular activity. Where do I find calorie information? The number of calories in a food can be found on a Nutrition Facts label. If a food does not have a Nutrition Facts  label, try to look up the calories online or ask your dietitian for help. Remember that calories are listed per serving. If you choose to have more than one serving of a food, you will have to multiply the calories per serving by the number of servings you plan to eat. For example, the label on a package of bread might say that a serving size is 1 slice and that there are 90 calories in a serving. If you eat 1 slice, you will have eaten 90 calories. If you eat 2 slices, you will have eaten 180 calories.   How do I keep a food log? After each time that you eat, record the following in your food log as soon as possible:  What you ate. Be sure to include toppings, sauces, and other extras on the food.  How much you ate. This can be measured in cups, ounces, or number of items.  How many calories were in each food and drink.  The total number of calories in the food you ate. Keep your food log near you, such as in a pocket-sized notebook or on an app or website on your mobile phone. Some programs will calculate calories for you and show you how many calories you have left to meet your daily goal. What are some portion-control tips?  Know how many calories are in a serving. This will   help you know how many servings you can have of a certain food.  Use a measuring cup to measure serving sizes. You could also try weighing out portions on a kitchen scale. With time, you will be able to estimate serving sizes for some foods.  Take time to put servings of different foods on your favorite plates or in your favorite bowls and cups so you know what a serving looks like.  Try not to eat straight from a food's packaging, such as from a bag or box. Eating straight from the package makes it hard to see how much you are eating and can lead to overeating. Put the amount you would like to eat in a cup or on a plate to make sure you are eating the right portion.  Use smaller plates, glasses, and bowls for smaller  portions and to prevent overeating.  Try not to multitask. For example, avoid watching TV or using your computer while eating. If it is time to eat, sit down at a table and enjoy your food. This will help you recognize when you are full. It will also help you be more mindful of what and how much you are eating. What are tips for following this plan? Reading food labels  Check the calorie count compared with the serving size. The serving size may be smaller than what you are used to eating.  Check the source of the calories. Try to choose foods that are high in protein, fiber, and vitamins, and low in saturated fat, trans fat, and sodium. Shopping  Read nutrition labels while you shop. This will help you make healthy decisions about which foods to buy.  Pay attention to nutrition labels for low-fat or fat-free foods. These foods sometimes have the same number of calories or more calories than the full-fat versions. They also often have added sugar, starch, or salt to make up for flavor that was removed with the fat.  Make a grocery list of lower-calorie foods and stick to it. Cooking  Try to cook your favorite foods in a healthier way. For example, try baking instead of frying.  Use low-fat dairy products. Meal planning  Use more fruits and vegetables. One-half of your plate should be fruits and vegetables.  Include lean proteins, such as chicken, turkey, and fish. Lifestyle Each week, aim to do one of the following:  150 minutes of moderate exercise, such as walking.  75 minutes of vigorous exercise, such as running. General information  Know how many calories are in the foods you eat most often. This will help you calculate calorie counts faster.  Find a way of tracking calories that works for you. Get creative. Try different apps or programs if writing down calories does not work for you. What foods should I eat?  Eat nutritious foods. It is better to have a nutritious,  high-calorie food, such as an avocado, than a food with few nutrients, such as a bag of potato chips.  Use your calories on foods and drinks that will fill you up and will not leave you hungry soon after eating. ? Examples of foods that fill you up are nuts and nut butters, vegetables, lean proteins, and high-fiber foods such as whole grains. High-fiber foods are foods with more than 5 g of fiber per serving.  Pay attention to calories in drinks. Low-calorie drinks include water and unsweetened drinks. The items listed above may not be a complete list of foods and beverages you can eat.   Contact a dietitian for more information.   What foods should I limit? Limit foods or drinks that are not good sources of vitamins, minerals, or protein or that are high in unhealthy fats. These include:  Candy.  Other sweets.  Sodas, specialty coffee drinks, alcohol, and juice. The items listed above may not be a complete list of foods and beverages you should avoid. Contact a dietitian for more information. How do I count calories when eating out?  Pay attention to portions. Often, portions are much larger when eating out. Try these tips to keep portions smaller: ? Consider sharing a meal instead of getting your own. ? If you get your own meal, eat only half of it. Before you start eating, ask for a container and put half of your meal into it. ? When available, consider ordering smaller portions from the menu instead of full portions.  Pay attention to your food and drink choices. Knowing the way food is cooked and what is included with the meal can help you eat fewer calories. ? If calories are listed on the menu, choose the lower-calorie options. ? Choose dishes that include vegetables, fruits, whole grains, low-fat dairy products, and lean proteins. ? Choose items that are boiled, broiled, grilled, or steamed. Avoid items that are buttered, battered, fried, or served with cream sauce. Items labeled as  crispy are usually fried, unless stated otherwise. ? Choose water, low-fat milk, unsweetened iced tea, or other drinks without added sugar. If you want an alcoholic beverage, choose a lower-calorie option, such as a glass of wine or light beer. ? Ask for dressings, sauces, and syrups on the side. These are usually high in calories, so you should limit the amount you eat. ? If you want a salad, choose a garden salad and ask for grilled meats. Avoid extra toppings such as bacon, cheese, or fried items. Ask for the dressing on the side, or ask for olive oil and vinegar or lemon to use as dressing.  Estimate how many servings of a food you are given. Knowing serving sizes will help you be aware of how much food you are eating at restaurants. Where to find more information  Centers for Disease Control and Prevention: www.cdc.gov  [place] Department of Agriculture: myplate.gov Summary  Calorie counting means keeping track of how many calories you eat and drink each day. If you eat fewer calories than your body needs, you should lose weight.  A healthy amount of weight to lose per week is usually 1-2 lb (0.5-0.9 kg). This usually means reducing your daily calorie intake by 500-750 calories.  The number of calories in a food can be found on a Nutrition Facts label. If a food does not have a Nutrition Facts label, try to look up the calories online or ask your dietitian for help.  Use smaller plates, glasses, and bowls for smaller portions and to prevent overeating.  Use your calories on foods and drinks that will fill you up and not leave you hungry shortly after a meal. This information is not intended to replace advice given to you by your health care provider. Make sure you discuss any questions you have with your health care provider. Document Revised: 07/21/2019 Document Reviewed: 07/21/2019 Elsevier Patient Education  2021 Elsevier Inc.  

## 2020-10-01 DIAGNOSIS — Z7689 Persons encountering health services in other specified circumstances: Secondary | ICD-10-CM | POA: Insufficient documentation

## 2020-10-01 DIAGNOSIS — Z6834 Body mass index (BMI) 34.0-34.9, adult: Secondary | ICD-10-CM | POA: Insufficient documentation

## 2020-10-01 DIAGNOSIS — Z6831 Body mass index (BMI) 31.0-31.9, adult: Secondary | ICD-10-CM | POA: Insufficient documentation

## 2020-10-01 DIAGNOSIS — Z6832 Body mass index (BMI) 32.0-32.9, adult: Secondary | ICD-10-CM | POA: Insufficient documentation

## 2020-10-01 DIAGNOSIS — Z6838 Body mass index (BMI) 38.0-38.9, adult: Secondary | ICD-10-CM | POA: Insufficient documentation

## 2020-10-01 DIAGNOSIS — R635 Abnormal weight gain: Secondary | ICD-10-CM | POA: Insufficient documentation

## 2020-10-01 DIAGNOSIS — Z6830 Body mass index (BMI) 30.0-30.9, adult: Secondary | ICD-10-CM | POA: Insufficient documentation

## 2020-10-01 DIAGNOSIS — Z6836 Body mass index (BMI) 36.0-36.9, adult: Secondary | ICD-10-CM | POA: Insufficient documentation

## 2020-11-01 ENCOUNTER — Other Ambulatory Visit: Payer: Self-pay

## 2020-11-01 ENCOUNTER — Other Ambulatory Visit: Payer: BC Managed Care – PPO

## 2020-11-01 DIAGNOSIS — Z0000 Encounter for general adult medical examination without abnormal findings: Secondary | ICD-10-CM

## 2020-11-01 DIAGNOSIS — E079 Disorder of thyroid, unspecified: Secondary | ICD-10-CM

## 2020-11-02 LAB — COMPREHENSIVE METABOLIC PANEL
ALT: 9 IU/L (ref 0–32)
AST: 11 IU/L (ref 0–40)
Albumin/Globulin Ratio: 1.8 (ref 1.2–2.2)
Albumin: 3.9 g/dL (ref 3.8–4.8)
Alkaline Phosphatase: 62 IU/L (ref 44–121)
BUN/Creatinine Ratio: 14 (ref 9–23)
BUN: 12 mg/dL (ref 6–20)
Bilirubin Total: 0.7 mg/dL (ref 0.0–1.2)
CO2: 23 mmol/L (ref 20–29)
Calcium: 9.3 mg/dL (ref 8.7–10.2)
Chloride: 104 mmol/L (ref 96–106)
Creatinine, Ser: 0.84 mg/dL (ref 0.57–1.00)
Globulin, Total: 2.2 g/dL (ref 1.5–4.5)
Glucose: 96 mg/dL (ref 65–99)
Potassium: 5 mmol/L (ref 3.5–5.2)
Sodium: 137 mmol/L (ref 134–144)
Total Protein: 6.1 g/dL (ref 6.0–8.5)
eGFR: 94 mL/min/{1.73_m2} (ref >59)

## 2020-11-02 LAB — CBC WITH DIFFERENTIAL/PLATELET
Basophils Absolute: 0 10*3/uL (ref 0.0–0.2)
Basos: 1 %
EOS (ABSOLUTE): 0.1 10*3/uL (ref 0.0–0.4)
Eos: 1 %
Hematocrit: 40 % (ref 34.0–46.6)
Hemoglobin: 13.2 g/dL (ref 11.1–15.9)
Immature Grans (Abs): 0 10*3/uL (ref 0.0–0.1)
Immature Granulocytes: 0 %
Lymphocytes Absolute: 1.6 10*3/uL (ref 0.7–3.1)
Lymphs: 28 %
MCH: 28.7 pg (ref 26.6–33.0)
MCHC: 33 g/dL (ref 31.5–35.7)
MCV: 87 fL (ref 79–97)
Monocytes Absolute: 0.3 10*3/uL (ref 0.1–0.9)
Monocytes: 6 %
Neutrophils Absolute: 3.6 10*3/uL (ref 1.4–7.0)
Neutrophils: 64 %
Platelets: 296 10*3/uL (ref 150–450)
RBC: 4.6 x10E6/uL (ref 3.77–5.28)
RDW: 12.2 % (ref 11.7–15.4)
WBC: 5.6 10*3/uL (ref 3.4–10.8)

## 2020-11-02 LAB — LIPID PANEL
Chol/HDL Ratio: 2.9 ratio (ref 0.0–4.4)
Cholesterol, Total: 173 mg/dL (ref 100–199)
HDL: 59 mg/dL (ref >39)
LDL Chol Calc (NIH): 102 mg/dL — ABNORMAL HIGH (ref 0–99)
Triglycerides: 62 mg/dL (ref 0–149)
VLDL Cholesterol Cal: 12 mg/dL (ref 5–40)

## 2020-11-02 LAB — TSH: TSH: 0.426 u[IU]/mL — ABNORMAL LOW (ref 0.450–4.500)

## 2020-11-02 LAB — T3: T3, Total: 113 ng/dL (ref 71–180)

## 2020-11-02 LAB — T4, FREE: Free T4: 1.16 ng/dL (ref 0.82–1.77)

## 2020-11-02 LAB — HEMOGLOBIN A1C
Est. average glucose Bld gHb Est-mCnc: 108 mg/dL
Hgb A1c MFr Bld: 5.4 % (ref 4.8–5.6)

## 2020-11-06 ENCOUNTER — Other Ambulatory Visit: Payer: Self-pay

## 2020-11-06 ENCOUNTER — Ambulatory Visit (INDEPENDENT_AMBULATORY_CARE_PROVIDER_SITE_OTHER): Payer: BC Managed Care – PPO | Admitting: Nurse Practitioner

## 2020-11-06 ENCOUNTER — Encounter: Payer: Self-pay | Admitting: Nurse Practitioner

## 2020-11-06 VITALS — BP 92/59 | HR 78 | Temp 99.2°F | Ht 62.5 in | Wt 199.5 lb

## 2020-11-06 DIAGNOSIS — Z6838 Body mass index (BMI) 38.0-38.9, adult: Secondary | ICD-10-CM | POA: Diagnosis not present

## 2020-11-06 DIAGNOSIS — Z0001 Encounter for general adult medical examination with abnormal findings: Secondary | ICD-10-CM

## 2020-11-06 DIAGNOSIS — E785 Hyperlipidemia, unspecified: Secondary | ICD-10-CM

## 2020-11-06 MED ORDER — PHENTERMINE HCL 15 MG PO CAPS: 15.0000 mg | ORAL_CAPSULE | ORAL | 0 refills | Status: DC

## 2020-11-06 NOTE — Progress Notes (Signed)
Established Patient Office Visit  Subjective:  Patient ID: Allison Fischer, female    DOB: Jun 14, 1986  Age: 35 y.o. MRN: [id number]  CC:  Chief Complaint  Patient presents with  . Annual Exam    HPI Allison Fischer presents for wellness exam. She states that she continues to have trouble with weight loss. She has decreased her overall daily calorie intake. She has increased her physical activity. she is taking a spin class three day per week and then doing a "pump" class twice per week. She has lost six pounds since she was last seen and adding the additional cardiovascular workouts each week. She states that despite the lifestyle changes, weight loss has been very slow.  She did have routine, fasting labs done since her initial visit. Her thyroid may be leaning toward hyperactive. Her TSH was 4.426 with normal Free t4 at 1.16 and normal t3 at 113. Her other lab results were essentially normal.  She has no other concerns or complaints today. She denies chest pain, chest pressure, or shortness of breath. She denies headaches or visual disturbances. He denies abdominal pain, nausea, vomiting, or changes in bowel or bladder habits. She sees a GYN provider for well women exams.   Past Medical History:  Diagnosis Date  . Allergy   . Medical history non-contributory     Past Surgical History:  Procedure Laterality Date  . APPENDECTOMY N/A    Phreesia 09/24/2020  . EYE SURGERY Bilateral 12/2007   cornea sx to correct vision  . LAPAROSCOPIC APPENDECTOMY N/A 04/21/2017   Procedure: APPENDECTOMY LAPAROSCOPIC;  Surgeon: Kinsinger, De Blanch, MD;  Location: [facility] OR;  Service: General;  Laterality: N/A;  . LAPAROSCOPIC TUBAL LIGATION Bilateral 03/23/2020   Procedure: LAPAROSCOPIC TUBAL LIGATION with filshie clips;  Surgeon: Mitchel Honour, DO;  Location: [facility];  Service: Gynecology;  Laterality: Bilateral;  . TUBAL LIGATION N/A    Phreesia 09/24/2020    Family History  Problem  Relation Age of Onset  . High Cholesterol Mother   . High blood pressure Father   . Hypertension Maternal Grandmother   . Cancer Maternal Grandmother   . Heart attack Maternal Grandfather   . High blood pressure Maternal Grandfather   . High blood pressure Paternal Grandmother   . Heart attack Paternal Grandfather   . High blood pressure Paternal Grandfather     Social History   Socioeconomic History  . Marital status: Married    Spouse name: Not on file  . Number of children: Not on file  . Years of education: Not on file  . Highest education level: Not on file  Occupational History  . Not on file  Tobacco Use  . Smoking status: Never Smoker  . Smokeless tobacco: Never Used  Vaping Use  . Vaping Use: Never used  Substance and Sexual Activity  . Alcohol use: Yes    Alcohol/week: 0.0 standard drinks    Comment: occ weekends  . Drug use: No  . Sexual activity: Yes  Other Topics Concern  . Not on file  Social History Narrative  . Not on file   Social Determinants of Health   Financial Resource Strain: Not on file  Food Insecurity: Not on file  Transportation Needs: Not on file  Physical Activity: Not on file  Stress: Not on file  Social Connections: Not on file  Intimate Partner Violence: Not on file    No outpatient medications prior to visit.   No facility-administered medications prior to  visit.    Allergies  Allergen Reactions  . Penicillins Rash    Has patient had a PCN reaction causing immediate rash, facial/tongue/throat swelling, SOB or lightheadedness with hypotension: Yes Has patient had a PCN reaction causing severe rash involving mucus membranes or skin necrosis: No Has patient had a PCN reaction that required hospitalization No Has patient had a PCN reaction occurring within the last 10 years: No If all of the above answers are "NO", then may proceed with Cephalosporin use.   . Tape Rash    Adnesive= itching and bumps    ROS Review of  Systems  Constitutional: Negative for chills and fever.       Six pound weight loss since her initial visit   HENT: Negative for congestion, postnasal drip, rhinorrhea, sinus pressure and sinus pain.   Eyes: Negative.   Respiratory: Negative for cough, chest tightness and wheezing.   Cardiovascular: Negative for chest pain and palpitations.  Gastrointestinal: Negative for constipation, diarrhea, nausea and vomiting.  Endocrine: Negative for cold intolerance, heat intolerance, polydipsia and polyuria.  Genitourinary: Negative for dysuria and menstrual problem.  Musculoskeletal: Negative for back pain and myalgias.  Skin: Negative for rash.  Allergic/Immunologic: Negative.   Neurological: Negative for dizziness, weakness and headaches.  Psychiatric/Behavioral: Negative for dysphoric mood and sleep disturbance. The patient is not nervous/anxious.       Objective:    Physical Exam Vitals and nursing note reviewed.  Constitutional:      Appearance: Normal appearance. She is well-developed. She is obese.  HENT:     Head: Normocephalic and atraumatic.     Right Ear: Ear canal and external ear normal.     Left Ear: Ear canal and external ear normal.     Nose: Nose normal.     Mouth/Throat:     Mouth: Mucous membranes are moist.     Pharynx: Oropharynx is clear.  Eyes:     Extraocular Movements: Extraocular movements intact.     Conjunctiva/sclera: Conjunctivae normal.     Pupils: Pupils are equal, round, and reactive to light.  Neck:     Thyroid: No thyromegaly.  Cardiovascular:     Rate and Rhythm: Normal rate and regular rhythm.     Pulses: Normal pulses.     Heart sounds: Normal heart sounds.  Pulmonary:     Effort: Pulmonary effort is normal.     Breath sounds: Normal breath sounds.  Abdominal:     General: Bowel sounds are normal.     Palpations: Abdomen is soft.     Tenderness: There is no abdominal tenderness.  Musculoskeletal:        General: Normal range of motion.      Cervical back: Normal range of motion and neck supple.  Skin:    General: Skin is warm and dry.     Capillary Refill: Capillary refill takes less than 2 seconds.  Neurological:     General: No focal deficit present.     Mental Status: She is alert and oriented to person, place, and time.  Psychiatric:        Mood and Affect: Mood normal.        Behavior: Behavior normal.        Thought Content: Thought content normal.        Judgment: Judgment normal.     Today's Vitals   11/06/20 1510  BP: (!) 92/59  Pulse: 78  Temp: 99.2 F (37.3 C)  SpO2: 98%  Weight: 199 lb  8 oz (90.5 kg)  Height: 5' 2.5" (1.588 m)   Body mass index is 35.91 kg/m.   Wt Readings from Last 3 Encounters:  11/06/20 199 lb 8 oz (90.5 kg)  09/25/20 205 lb 1.6 oz (93 kg)  03/23/20 203 lb 11.2 oz (92.4 kg)     Health Maintenance Due  Topic Date Due  . Hepatitis C Screening  Never done  . PAP SMEAR-Modifier  Never done    There are no preventive care reminders to display for this patient.  Lab Results  Component Value Date   TSH 0.426 (L) 11/01/2020   Lab Results  Component Value Date   WBC 5.6 11/01/2020   HGB 13.2 11/01/2020   HCT 40.0 11/01/2020   MCV 87 11/01/2020   PLT 296 11/01/2020   Lab Results  Component Value Date   NA 137 11/01/2020   K 5.0 11/01/2020   CO2 23 11/01/2020   GLUCOSE 96 11/01/2020   BUN 12 11/01/2020   CREATININE 0.84 11/01/2020   BILITOT 0.7 11/01/2020   ALKPHOS 62 11/01/2020   AST 11 11/01/2020   ALT 9 11/01/2020   PROT 6.1 11/01/2020   ALBUMIN 3.9 11/01/2020   CALCIUM 9.3 11/01/2020   ANIONGAP 11 09/28/2017   EGFR 94 11/01/2020   Lab Results  Component Value Date   CHOL 173 11/01/2020   Lab Results  Component Value Date   HDL 59 11/01/2020   Lab Results  Component Value Date   LDLCALC 102 (H) 11/01/2020   Lab Results  Component Value Date   TRIG 62 11/01/2020   Lab Results  Component Value Date   CHOLHDL 2.9 11/01/2020   Lab  Results  Component Value Date   HGBA1C 5.4 11/01/2020      Assessment & Plan:  1. Encounter for general adult medical examination with abnormal findings Annual wellness visit today.   2. BMI 38.0-38.9,adult Reviewed labs showing tendency toward hyperthyroid with TSH at 4.426 and normal Free T4 and T3. Will do trial of phentermine 37.$RemoveBeforeDEI'5mg'[id number]$  tablets daily. Reviewed risk factors and side effects associated with taking stimulant appetite suppressants. She voiced understanding. She was encouraged to limit calorie intake to 1200 calories per day and continue with daily exercise routine. Reassess in one month and determine continuation for further treatment. - phentermine 15 MG capsule; Take 1 capsule (15 mg total) by mouth every morning.  Dispense: 30 capsule; Refill: 0  3. Hyperlipidemia LDL goal <100 Reviewed labs. LDL was 102 with remainder of lipid panel within normal limits. Discussed weight loss helping to reduce and control cholesterol. Will monitor by checking again in one year.  Problem List Items Addressed This Visit      Other   BMI 38.0-38.9,adult   Relevant Medications   phentermine 15 MG capsule   Encounter for general adult medical examination with abnormal findings - Primary   Hyperlipidemia LDL goal <100      Meds ordered this encounter  Medications  . phentermine 15 MG capsule    Sig: Take 1 capsule (15 mg total) by mouth every morning.    Dispense:  30 capsule    Refill:  0    Order Specific Question:   Supervising Provider    Answer:   Beatrice Lecher D [[id number]    Follow-up: Return in about 4 weeks (around 12/04/2020) for weight management .    Ronnell Freshwater, NP

## 2020-11-06 NOTE — Patient Instructions (Signed)
BMI for Adults What is BMI? Body mass index (BMI) is a number that is calculated from a person's weight and height. BMI can help estimate how much of a person's weight is composed of fat. BMI does not measure body fat directly. Rather, it is an alternative to procedures that directly measure body fat, which can be difficult and expensive. BMI can help identify people who may be at higher risk for certain medical problems. What are BMI measurements used for? BMI is used as a screening tool to identify possible weight problems. It helps determine whether a person is obese, overweight, a healthy weight, or underweight. BMI is useful for:  Identifying a weight problem that may be related to a medical condition or may increase the risk for medical problems.  Promoting changes, such as changes in diet and exercise, to help reach a healthy weight. BMI screening can be repeated to see if these changes are working. How is BMI calculated? BMI involves measuring your weight in relation to your height. Both height and weight are measured, and the BMI is calculated from those numbers. This can be done either in English ([place] or metric measurements. Note that charts and online BMI calculators are available to help you find your BMI quickly and easily without having to do these calculations yourself. To calculate your BMI in English ([place] measurements: 1. Measure your weight in pounds (lb). 2. Multiply the number of pounds by 703. ? For example, for a person who weighs 180 lb, multiply that number by 703, which equals 126,540. 3. Measure your height in inches. Then multiply that number by itself to get a measurement called "inches squared." ? For example, for a person who is 70 inches tall, the "inches squared" measurement is 70 inches x 70 inches, which equals 4,900 inches squared. 4. Divide the total from step 2 (number of lb x 703) by the total from step 3 (inches squared): 126,540  4,900 = 25.8. This is  your BMI.   To calculate your BMI in metric measurements: 1. Measure your weight in kilograms (kg). 2. Measure your height in meters (m). Then multiply that number by itself to get a measurement called "meters squared." ? For example, for a person who is 1.75 m tall, the "meters squared" measurement is 1.75 m x 1.75 m, which is equal to 3.1 meters squared. 3. Divide the number of kilograms (your weight) by the meters squared number. In this example: 70  3.1 = 22.6. This is your BMI. What do the results mean? BMI charts are used to identify whether you are underweight, normal weight, overweight, or obese. The following guidelines will be used:  Underweight: BMI less than 18.5.  Normal weight: BMI between 18.5 and 24.9.  Overweight: BMI between 25 and 29.9.  Obese: BMI of 30 or above. Keep these notes in mind:  Weight includes both fat and muscle, so someone with a muscular build, such as an athlete, may have a BMI that is higher than 24.9. In cases like these, BMI is not an accurate measure of body fat.  To determine if excess body fat is the cause of a BMI of 25 or higher, further assessments may need to be done by a health care provider.  BMI is usually interpreted in the same way for men and women. Where to find more information For more information about BMI, including tools to quickly calculate your BMI, go to these websites:  Centers for Disease Control and Prevention: www.cdc.gov    American Heart Association: www.heart.org  National Heart, Lung, and Blood Institute: www.nhlbi.nih.gov Summary  Body mass index (BMI) is a number that is calculated from a person's weight and height.  BMI may help estimate how much of a person's weight is composed of fat. BMI can help identify those who may be at higher risk for certain medical problems.  BMI can be measured using English measurements or metric measurements.  BMI charts are used to identify whether you are underweight, normal  weight, overweight, or obese. This information is not intended to replace advice given to you by your health care provider. Make sure you discuss any questions you have with your health care provider. Document Revised: 03/02/2019 Document Reviewed: 01/07/2019 Elsevier Patient Education  2021 Elsevier Inc.  

## 2020-11-19 DIAGNOSIS — Z0001 Encounter for general adult medical examination with abnormal findings: Secondary | ICD-10-CM | POA: Insufficient documentation

## 2020-11-19 DIAGNOSIS — E785 Hyperlipidemia, unspecified: Secondary | ICD-10-CM | POA: Insufficient documentation

## 2020-12-05 ENCOUNTER — Encounter: Payer: Self-pay | Admitting: Nurse Practitioner

## 2020-12-05 ENCOUNTER — Other Ambulatory Visit: Payer: Self-pay

## 2020-12-05 ENCOUNTER — Ambulatory Visit: Payer: BC Managed Care – PPO | Admitting: Nurse Practitioner

## 2020-12-05 VITALS — BP 101/64 | HR 94 | Temp 98.9°F | Ht 62.5 in | Wt 192.6 lb

## 2020-12-05 DIAGNOSIS — Z6834 Body mass index (BMI) 34.0-34.9, adult: Secondary | ICD-10-CM

## 2020-12-05 DIAGNOSIS — E785 Hyperlipidemia, unspecified: Secondary | ICD-10-CM | POA: Diagnosis not present

## 2020-12-05 MED ORDER — PHENTERMINE HCL 15 MG PO CAPS: 15.0000 mg | ORAL_CAPSULE | ORAL | 0 refills | Status: DC

## 2020-12-05 NOTE — Progress Notes (Signed)
Established Patient Office Visit  Subjective:  Patient ID: Allison Fischer, female    DOB: Oct 31, 1985  Age: 35 y.o. MRN: [id number]  CC:  Chief Complaint  Patient presents with   Weight Check    HPI [name] presents for follow up of weight management. She is taking phentermine 89m in the mornings. I started her on this medication 11/06/2020. She has lost seven pounds since that date. She has lost 13 pounds since establishing care with me in April 2022. She is taking a spin class at her gym on Mondays, wednesdays, and fridays. On tuesdays and thursdays, she does a pump class. She continues to limit calorie intake to 1500 calories. She works full time and has children at home. She states that she had single episode when her fitbit watch registered a high heart rate. The patient denies feeling any negative symptoms such as palpitations, shortness of breath, dizziness, or headache. She denies dry mouth or constipation. She states that she would like to continue with medication in addition to diet and exercise to help her with weight loss.    Past Medical History:  Diagnosis Date   Allergy    Medical history non-contributory     Past Surgical History:  Procedure Laterality Date   APPENDECTOMY N/A    Phreesia 09/24/2020   EYE SURGERY Bilateral 12/2007   cornea sx to correct vision   LAPAROSCOPIC APPENDECTOMY N/A 04/21/2017   Procedure: APPENDECTOMY LAPAROSCOPIC;  Surgeon: Kinsinger, LArta Bruce MD;  Location: [facility]  Service: General;  Laterality: N/A;   LAPAROSCOPIC TUBAL LIGATION Bilateral 03/23/2020   Procedure: LAPAROSCOPIC TUBAL LIGATION with filshie clips;  Surgeon: MLinda Hedges DO;  Location[facility]  Service: Gynecology;  Laterality: Bilateral;   TUBAL LIGATION N/A    Phreesia 09/24/2020    Family History  Problem Relation Age of Onset   High Cholesterol Mother    High blood pressure Father    Hypertension Maternal Grandmother    Cancer Maternal  Grandmother    Heart attack Maternal Grandfather    High blood pressure Maternal Grandfather    High blood pressure Paternal Grandmother    Heart attack Paternal Grandfather    High blood pressure Paternal Grandfather     Social History   Socioeconomic History   Marital status: Married    Spouse name: Not on file   Number of children: Not on file   Years of education: Not on file   Highest education level: Not on file  Occupational History   Not on file  Tobacco Use   Smoking status: Never   Smokeless tobacco: Never  Vaping Use   Vaping Use: Never used  Substance and Sexual Activity   Alcohol use: Yes    Alcohol/week: 0.0 standard drinks    Comment: occ weekends   Drug use: No   Sexual activity: Yes  Other Topics Concern   Not on file  Social History Narrative   Not on file   Social Determinants of Health   Financial Resource Strain: Not on file  Food Insecurity: Not on file  Transportation Needs: Not on file  Physical Activity: Not on file  Stress: Not on file  Social Connections: Not on file  Intimate Partner Violence: Not on file    Outpatient Medications Prior to Visit  Medication Sig Dispense Refill   phentermine 15 MG capsule Take 1 capsule (15 mg total) by mouth every morning. 30 capsule 0   No facility-administered medications prior to visit.  Allergies  Allergen Reactions   Penicillins Rash    Has patient had a PCN reaction causing immediate rash, facial/tongue/throat swelling, SOB or lightheadedness with hypotension: Yes Has patient had a PCN reaction causing severe rash involving mucus membranes or skin necrosis: No Has patient had a PCN reaction that required hospitalization No Has patient had a PCN reaction occurring within the last 10 years: No If all of the above answers are "NO", then may proceed with Cephalosporin use.    Tape Rash    Adnesive= itching and bumps    ROS Review of Systems  Constitutional:  Negative for chills,  fatigue and fever.       Seven pound weight loss since her last visit   HENT:  Negative for congestion, postnasal drip, rhinorrhea, sinus pressure and sinus pain.   Eyes: Negative.   Respiratory:  Negative for chest tightness.   Cardiovascular:  Negative for chest pain and palpitations.  Gastrointestinal:  Negative for constipation, diarrhea, nausea and vomiting.  Endocrine: Negative for cold intolerance, heat intolerance, polydipsia and polyuria.  Musculoskeletal:  Negative for arthralgias and back pain.  Skin:  Negative for rash.  Allergic/Immunologic: Negative.   Neurological:  Negative for dizziness, weakness and headaches.  Hematological: Negative.   Psychiatric/Behavioral:  Negative for dysphoric mood and sleep disturbance. The patient is not nervous/anxious.      Objective:    Physical Exam Vitals and nursing note reviewed.  Constitutional:      Appearance: Normal appearance. She is well-developed. She is obese.  HENT:     Head: Normocephalic and atraumatic.     Nose: Nose normal.     Mouth/Throat:     Mouth: Mucous membranes are moist.  Eyes:     Extraocular Movements: Extraocular movements intact.     Conjunctiva/sclera: Conjunctivae normal.     Pupils: Pupils are equal, round, and reactive to light.  Cardiovascular:     Rate and Rhythm: Normal rate and regular rhythm.     Pulses: Normal pulses.     Heart sounds: Normal heart sounds.  Pulmonary:     Effort: Pulmonary effort is normal.     Breath sounds: Normal breath sounds.  Abdominal:     Palpations: Abdomen is soft.  Musculoskeletal:        General: Normal range of motion.     Cervical back: Normal range of motion and neck supple.  Lymphadenopathy:     Cervical: No cervical adenopathy.  Skin:    General: Skin is warm and dry.     Capillary Refill: Capillary refill takes less than 2 seconds.  Neurological:     General: No focal deficit present.     Mental Status: She is alert and oriented to person, place,  and time.  Psychiatric:        Mood and Affect: Mood normal.        Behavior: Behavior normal.        Thought Content: Thought content normal.        Judgment: Judgment normal.    Today's Vitals   12/05/20 1557  BP: 101/64  Pulse: 94  Temp: 98.9 F (37.2 C)  SpO2: 100%  Weight: 192 lb 9.6 oz (87.4 kg)  Height: 5' 2.5" (1.588 m)   Body mass index is 34.67 kg/m.   Wt Readings from Last 3 Encounters:  12/05/20 192 lb 9.6 oz (87.4 kg)  11/06/20 199 lb 8 oz (90.5 kg)  09/25/20 205 lb 1.6 oz (93 kg)  Health Maintenance Due  Topic Date Due   Hepatitis C Screening  Never done   PAP SMEAR-Modifier  Never done    There are no preventive care reminders to display for this patient.  Lab Results  Component Value Date   TSH 0.426 (L) 11/01/2020   Lab Results  Component Value Date   WBC 5.6 11/01/2020   HGB 13.2 11/01/2020   HCT 40.0 11/01/2020   MCV 87 11/01/2020   PLT 296 11/01/2020   Lab Results  Component Value Date   NA 137 11/01/2020   K 5.0 11/01/2020   CO2 23 11/01/2020   GLUCOSE 96 11/01/2020   BUN 12 11/01/2020   CREATININE 0.84 11/01/2020   BILITOT 0.7 11/01/2020   ALKPHOS 62 11/01/2020   AST 11 11/01/2020   ALT 9 11/01/2020   PROT 6.1 11/01/2020   ALBUMIN 3.9 11/01/2020   CALCIUM 9.3 11/01/2020   ANIONGAP 11 09/28/2017   EGFR 94 11/01/2020   Lab Results  Component Value Date   CHOL 173 11/01/2020   Lab Results  Component Value Date   HDL 59 11/01/2020   Lab Results  Component Value Date   LDLCALC 102 (H) 11/01/2020   Lab Results  Component Value Date   TRIG 62 11/01/2020   Lab Results  Component Value Date   CHOLHDL 2.9 11/01/2020   Lab Results  Component Value Date   HGBA1C 5.4 11/01/2020      Assessment & Plan:  1. Hyperlipidemia LDL goal <100 LDL 11/01/2020 was 102. Patient actively limiting calorie intake and increasing exercise and making positive progress. Will recheck in one year.   2. BMI 34.0-34.9,adult Seven  pound weight loss since most recent visit. No negative side effects reported. Will continue for another 30 days. She should continue with low calorie diet and daily exercise regimen. Advised her to check heart rate manually if watch registers a high reading over 170. Demonstrated where to check pulse and how to do it accurately. She should stop taking this medication if she experiences any symptoms such as palpitations, shortness of breath, headache, or shortness of breath. She voiced understanding of the instructions and advise.  - phentermine 15 MG capsule; Take 1 capsule (15 mg total) by mouth every morning.  Dispense: 30 capsule; Refill: 0   Problem List Items Addressed This Visit       Other   BMI 38.0-38.9,adult   Relevant Medications   phentermine 15 MG capsule   Hyperlipidemia LDL goal <100 - Primary    Meds ordered this encounter  Medications   phentermine 15 MG capsule    Sig: Take 1 capsule (15 mg total) by mouth every morning.    Dispense:  30 capsule    Refill:  0    Order Specific Question:   Supervising Provider    Answer:   Beatrice Lecher D [[id number]]    Follow-up: Return in 4 weeks (on 01/02/2021) for routine - weight management.    Ronnell Freshwater, NP

## 2021-01-02 ENCOUNTER — Encounter: Payer: Self-pay | Admitting: Nurse Practitioner

## 2021-01-02 ENCOUNTER — Other Ambulatory Visit: Payer: Self-pay

## 2021-01-02 ENCOUNTER — Ambulatory Visit: Payer: BC Managed Care – PPO | Admitting: Nurse Practitioner

## 2021-01-02 VITALS — BP 102/66 | HR 76 | Temp 98.0°F | Ht 62.5 in | Wt 192.5 lb

## 2021-01-02 DIAGNOSIS — Z6834 Body mass index (BMI) 34.0-34.9, adult: Secondary | ICD-10-CM

## 2021-01-02 DIAGNOSIS — R635 Abnormal weight gain: Secondary | ICD-10-CM | POA: Diagnosis not present

## 2021-01-02 MED ORDER — PHENTERMINE HCL 15 MG PO CAPS: 15.0000 mg | ORAL_CAPSULE | ORAL | 0 refills | Status: DC

## 2021-01-02 NOTE — Progress Notes (Signed)
Established Patient Office Visit  Subjective:  Patient ID: Allison Fischer, female    DOB: April 04, 1986  Age: 35 y.o. MRN: [id number]  CC:  Chief Complaint  Patient presents with   Weight Check    HPI Allison Fischer presents for follow-up of weight management.  She has had no weight loss or weight gain over the past 30 days.  She states that she had recent vacation for 1 week.  Went through an [place]inclusive resort and [place].  States she ate more than she should have.  She did continue to exercise while she was there.  Upon return last week she returned to her normal exercise programs.  She is back to a reduced calorie diet of 1200 to 1500 cal/day.  She continues to take phentermine 15 mg capsules daily.  She denies negative side effects associated with taking this medication.  She denies chest pain, chest pressure, or shortness of breath. She denies headaches or visual disturbances. She denies abdominal pain, nausea, vomiting, or changes in bowel or bladder habits.    Past Medical History:  Diagnosis Date   Allergy    Medical history non-contributory     Past Surgical History:  Procedure Laterality Date   APPENDECTOMY N/A    Phreesia 09/24/2020   EYE SURGERY Bilateral 12/2007   cornea sx to correct vision   LAPAROSCOPIC APPENDECTOMY N/A 04/21/2017   Procedure: APPENDECTOMY LAPAROSCOPIC;  Surgeon: Kinsinger, De Blanch, MD;  Location: [facility];  Service: General;  Laterality: N/A;   LAPAROSCOPIC TUBAL LIGATION Bilateral 03/23/2020   Procedure: LAPAROSCOPIC TUBAL LIGATION with filshie clips;  Surgeon: Mitchel Honour, DO;  Location: [facility];  Service: Gynecology;  Laterality: Bilateral;   TUBAL LIGATION N/A    Phreesia 09/24/2020    Family History  Problem Relation Age of Onset   High Cholesterol Mother    High blood pressure Father    Hypertension Maternal Grandmother    Cancer Maternal Grandmother    Heart attack Maternal Grandfather    High blood pressure  Maternal Grandfather    High blood pressure Paternal Grandmother    Heart attack Paternal Grandfather    High blood pressure Paternal Grandfather     Social History   Socioeconomic History   Marital status: Married    Spouse name: Not on file   Number of children: Not on file   Years of education: Not on file   Highest education level: Not on file  Occupational History   Not on file  Tobacco Use   Smoking status: Never   Smokeless tobacco: Never  Vaping Use   Vaping Use: Never used  Substance and Sexual Activity   Alcohol use: Yes    Alcohol/week: 0.0 standard drinks    Comment: occ weekends   Drug use: No   Sexual activity: Yes  Other Topics Concern   Not on file  Social History Narrative   Not on file   Social Determinants of Health   Financial Resource Strain: Not on file  Food Insecurity: Not on file  Transportation Needs: Not on file  Physical Activity: Not on file  Stress: Not on file  Social Connections: Not on file  Intimate Partner Violence: Not on file    Outpatient Medications Prior to Visit  Medication Sig Dispense Refill   phentermine 15 MG capsule Take 1 capsule (15 mg total) by mouth every morning. 30 capsule 0   No facility-administered medications prior to visit.    Allergies  Allergen Reactions  Penicillins Rash    Has patient had a PCN reaction causing immediate rash, facial/tongue/throat swelling, SOB or lightheadedness with hypotension: Yes Has patient had a PCN reaction causing severe rash involving mucus membranes or skin necrosis: No Has patient had a PCN reaction that required hospitalization No Has patient had a PCN reaction occurring within the last 10 years: No If all of the above answers are "NO", then may proceed with Cephalosporin use.    Tape Rash    Adnesive= itching and bumps    ROS Review of Systems  Constitutional:  Negative for chills, fatigue and fever.       No weight loss or weight gain over the last 30 days.   HENT:  Negative for congestion, postnasal drip, rhinorrhea, sinus pressure and sinus pain.   Eyes: Negative.   Respiratory:  Negative for chest tightness and shortness of breath.   Cardiovascular:  Negative for chest pain and palpitations.  Gastrointestinal:  Negative for constipation, diarrhea, nausea and vomiting.  Endocrine: Negative for cold intolerance, heat intolerance, polydipsia and polyuria.  Musculoskeletal:  Negative for arthralgias and back pain.  Skin:  Negative for rash.  Allergic/Immunologic: Negative.   Neurological:  Negative for dizziness, weakness and headaches.  Hematological: Negative.   Psychiatric/Behavioral:  Negative for dysphoric mood and sleep disturbance. The patient is not nervous/anxious.      Objective:    Physical Exam Vitals and nursing note reviewed.  Constitutional:      Appearance: Normal appearance. She is well-developed. She is obese.  HENT:     Head: Normocephalic and atraumatic.     Nose: Nose normal.     Mouth/Throat:     Mouth: Mucous membranes are moist.  Eyes:     Extraocular Movements: Extraocular movements intact.     Conjunctiva/sclera: Conjunctivae normal.     Pupils: Pupils are equal, round, and reactive to light.  Cardiovascular:     Rate and Rhythm: Normal rate and regular rhythm.     Pulses: Normal pulses.     Heart sounds: Normal heart sounds.  Pulmonary:     Effort: Pulmonary effort is normal.     Breath sounds: Normal breath sounds.  Abdominal:     Palpations: Abdomen is soft.  Musculoskeletal:        General: Normal range of motion.     Cervical back: Normal range of motion and neck supple.  Lymphadenopathy:     Cervical: No cervical adenopathy.  Skin:    General: Skin is warm and dry.     Capillary Refill: Capillary refill takes less than 2 seconds.  Neurological:     General: No focal deficit present.     Mental Status: She is alert and oriented to person, place, and time.  Psychiatric:        Mood and  Affect: Mood normal.        Behavior: Behavior normal.        Thought Content: Thought content normal.        Judgment: Judgment normal.    Today's Vitals   01/02/21 1623  BP: 102/66  Pulse: 76  Temp: 98 F (36.7 C)  SpO2: 100%  Weight: 192 lb 8 oz (87.3 kg)  Height: 5' 2.5" (1.588 m)   Body mass index is 34.65 kg/m.   Wt Readings from Last 3 Encounters:  01/02/21 192 lb 8 oz (87.3 kg)  12/05/20 192 lb 9.6 oz (87.4 kg)  11/06/20 199 lb 8 oz (90.5 kg)  Health Maintenance Due  Topic Date Due   Hepatitis C Screening  Never done    There are no preventive care reminders to display for this patient.  Lab Results  Component Value Date   TSH 0.426 (L) 11/01/2020   Lab Results  Component Value Date   WBC 5.6 11/01/2020   HGB 13.2 11/01/2020   HCT 40.0 11/01/2020   MCV 87 11/01/2020   PLT 296 11/01/2020   Lab Results  Component Value Date   NA 137 11/01/2020   K 5.0 11/01/2020   CO2 23 11/01/2020   GLUCOSE 96 11/01/2020   BUN 12 11/01/2020   CREATININE 0.84 11/01/2020   BILITOT 0.7 11/01/2020   ALKPHOS 62 11/01/2020   AST 11 11/01/2020   ALT 9 11/01/2020   PROT 6.1 11/01/2020   ALBUMIN 3.9 11/01/2020   CALCIUM 9.3 11/01/2020   ANIONGAP 11 09/28/2017   EGFR 94 11/01/2020   Lab Results  Component Value Date   CHOL 173 11/01/2020   Lab Results  Component Value Date   HDL 59 11/01/2020   Lab Results  Component Value Date   LDLCALC 102 (H) 11/01/2020   Lab Results  Component Value Date   TRIG 62 11/01/2020   Lab Results  Component Value Date   CHOLHDL 2.9 11/01/2020   Lab Results  Component Value Date   HGBA1C 5.4 11/01/2020      Assessment & Plan:  1. Body mass index (BMI) of 34.0-34.9 in adult No weight loss or weight gain over the past 30 days.  Patient admits that vacation put a "hiccup" into weight loss plan.  She is back to 1200 to 1500 cal/day diet.  She is also return to her regular exercise routine.  Will refill phentermine 15  mg capsules.  Will reassess in 30 days.  Problem List Items Addressed This Visit       Other   Body mass index (BMI) of 34.0-34.9 in adult - Primary   Relevant Medications   phentermine 15 MG capsule    Meds ordered this encounter  Medications   phentermine 15 MG capsule    Sig: Take 1 capsule (15 mg total) by mouth every morning.    Dispense:  30 capsule    Refill:  0    Order Specific Question:   Supervising Provider    Answer:   Beatrice Lecher D [[id number]    Follow-up: Return in 4 weeks (on 01/30/2021) for routine - weight management.    Ronnell Freshwater, NP

## 2021-01-02 NOTE — Patient Instructions (Addendum)
Calorie Counting for Weight Loss Calories are units of energy. Your body needs a certain number of calories from food to keep going throughout the day. When you eat or drink more calories than your body needs, your body stores the extra calories mostly as fat. When you eat or drink fewer calories than your body needs, your body burns fat to getthe energy it needs. Calorie counting means keeping track of how many calories you eat and drink each day. Calorie counting can be helpful if you need to lose weight. If you eat fewer calories than your body needs, you should lose weight. Ask yourhealth care provider what a healthy weight is for you. For calorie counting to work, you will need to eat the right number of calories each day to lose a healthy amount of weight per week. A dietitian can help you figure out how many calories you need in a day and will suggest ways to reach your calorie goal. A healthy amount of weight to lose each week is usually 1-2 lb (0.5-0.9 kg). This usually means that your daily calorie intake should be reduced by 500-750 calories. Eating 1,200-1,500 calories a day can help most women lose weight. Eating 1,500-1,800 calories a day can help most men lose weight. What do I need to know about calorie counting? Work with your health care provider or dietitian to determine how many calories you should get each day. To meet your daily calorie goal, you will need to: Find out how many calories are in each food that you would like to eat. Try to do this before you eat. Decide how much of the food you plan to eat. Keep a food log. Do this by writing down what you ate and how many calories it had. To successfully lose weight, it is important to balance calorie counting with ahealthy lifestyle that includes regular activity. Where do I find calorie information?  The number of calories in a food can be found on a Nutrition Facts label. If a food does not have a Nutrition Facts label, try to  look up the calories onlineor ask your dietitian for help. Remember that calories are listed per serving. If you choose to have more than one serving of a food, you will have to multiply the calories per serving by the number of servings you plan to eat. For example, the label on a package of bread might say that a serving size is 1 slice and that there are 90 calories in a serving. If you eat 1 slice, you will have eaten 90 calories. If you eat 2slices, you will have eaten 180 calories. How do I keep a food log? After each time that you eat, record the following in your food log as soon as possible: What you ate. Be sure to include toppings, sauces, and other extras on the food. How much you ate. This can be measured in cups, ounces, or number of items. How many calories were in each food and drink. The total number of calories in the food you ate. Keep your food log near you, such as in a pocket-sized notebook or on an app or website on your mobile phone. Some programs will calculate calories for you andshow you how many calories you have left to meet your daily goal. What are some portion-control tips? Know how many calories are in a serving. This will help you know how many servings you can have of a certain food. Use a measuring cup to   measure serving sizes. You could also try weighing out portions on a kitchen scale. With time, you will be able to estimate serving sizes for some foods. Take time to put servings of different foods on your favorite plates or in your favorite bowls and cups so you know what a serving looks like. Try not to eat straight from a food's packaging, such as from a bag or box. Eating straight from the package makes it hard to see how much you are eating and can lead to overeating. Put the amount you would like to eat in a cup or on a plate to make sure you are eating the right portion. Use smaller plates, glasses, and bowls for smaller portions and to prevent  overeating. Try not to multitask. For example, avoid watching TV or using your computer while eating. If it is time to eat, sit down at a table and enjoy your food. This will help you recognize when you are full. It will also help you be more mindful of what and how much you are eating. What are tips for following this plan? Reading food labels Check the calorie count compared with the serving size. The serving size may be smaller than what you are used to eating. Check the source of the calories. Try to choose foods that are high in protein, fiber, and vitamins, and low in saturated fat, trans fat, and sodium. Shopping Read nutrition labels while you shop. This will help you make healthy decisions about which foods to buy. Pay attention to nutrition labels for low-fat or fat-free foods. These foods sometimes have the same number of calories or more calories than the full-fat versions. They also often have added sugar, starch, or salt to make up for flavor that was removed with the fat. Make a grocery list of lower-calorie foods and stick to it. Cooking Try to cook your favorite foods in a healthier way. For example, try baking instead of frying. Use low-fat dairy products. Meal planning Use more fruits and vegetables. One-half of your plate should be fruits and vegetables. Include lean proteins, such as chicken, turkey, and fish. Lifestyle Each week, aim to do one of the following: 150 minutes of moderate exercise, such as walking. 75 minutes of vigorous exercise, such as running. General information Know how many calories are in the foods you eat most often. This will help you calculate calorie counts faster. Find a way of tracking calories that works for you. Get creative. Try different apps or programs if writing down calories does not work for you. What foods should I eat?  Eat nutritious foods. It is better to have a nutritious, high-calorie food, such as an avocado, than a food with  few nutrients, such as a bag of potato chips. Use your calories on foods and drinks that will fill you up and will not leave you hungry soon after eating. Examples of foods that fill you up are nuts and nut butters, vegetables, lean proteins, and high-fiber foods such as whole grains. High-fiber foods are foods with more than 5 g of fiber per serving. Pay attention to calories in drinks. Low-calorie drinks include water and unsweetened drinks. The items listed above may not be a complete list of foods and beverages you can eat. Contact a dietitian for more information. What foods should I limit? Limit foods or drinks that are not good sources of vitamins, minerals, or protein or that are high in unhealthy fats. These include: Candy. Other sweets. Sodas, specialty   coffee drinks, alcohol, and juice. The items listed above may not be a complete list of foods and beverages you should avoid. Contact a dietitian for more information. How do I count calories when eating out? Pay attention to portions. Often, portions are much larger when eating out. Try these tips to keep portions smaller: Consider sharing a meal instead of getting your own. If you get your own meal, eat only half of it. Before you start eating, ask for a container and put half of your meal into it. When available, consider ordering smaller portions from the menu instead of full portions. Pay attention to your food and drink choices. Knowing the way food is cooked and what is included with the meal can help you eat fewer calories. If calories are listed on the menu, choose the lower-calorie options. Choose dishes that include vegetables, fruits, whole grains, low-fat dairy products, and lean proteins. Choose items that are boiled, broiled, grilled, or steamed. Avoid items that are buttered, battered, fried, or served with cream sauce. Items labeled as crispy are usually fried, unless stated otherwise. Choose water, low-fat milk,  unsweetened iced tea, or other drinks without added sugar. If you want an alcoholic beverage, choose a lower-calorie option, such as a glass of wine or light beer. Ask for dressings, sauces, and syrups on the side. These are usually high in calories, so you should limit the amount you eat. If you want a salad, choose a garden salad and ask for grilled meats. Avoid extra toppings such as bacon, cheese, or fried items. Ask for the dressing on the side, or ask for olive oil and vinegar or lemon to use as dressing. Estimate how many servings of a food you are given. Knowing serving sizes will help you be aware of how much food you are eating at restaurants. Where to find more information Centers for Disease Control and Prevention: www.cdc.gov [place] Department of Agriculture: myplate.gov Summary Calorie counting means keeping track of how many calories you eat and drink each day. If you eat fewer calories than your body needs, you should lose weight. A healthy amount of weight to lose per week is usually 1-2 lb (0.5-0.9 kg). This usually means reducing your daily calorie intake by 500-750 calories. The number of calories in a food can be found on a Nutrition Facts label. If a food does not have a Nutrition Facts label, try to look up the calories online or ask your dietitian for help. Use smaller plates, glasses, and bowls for smaller portions and to prevent overeating. Use your calories on foods and drinks that will fill you up and not leave you hungry shortly after a meal. This information is not intended to replace advice given to you by your health care provider. Make sure you discuss any questions you have with your healthcare provider. Document Revised: 07/21/2019 Document Reviewed: 07/21/2019 Else[employer] Patient Education  2022 [employer]  Fat and Cholesterol Restricted Eating Plan Getting too much fat and cholesterol in your diet may cause health problems. Choosing the right foods helps keep  your fat and cholesterol at normal levels.This can keep you from getting certain diseases. Your doctor may recommend an eating plan that includes: Total fat: ______% or less of total calories a day. Saturated fat: ______% or less of total calories a day. Cholesterol: less than _________mg a day. Fiber: ______g a day. What are tips for following this plan? Meal planning At meals, divide your plate into four equal parts: Fill one-half of   your plate with vegetables and green salads. Fill one-fourth of your plate with whole grains. Fill one-fourth of your plate with low-fat (lean) protein foods. Eat fish that is high in omega-3 fats at least two times a week. This includes mackerel, tuna, sardines, and salmon. Eat foods that are high in fiber, such as whole grains, beans, apples, broccoli, carrots, peas, and barley. General tips  Work with your doctor to lose weight if you need to. Avoid: Foods with added sugar. Fried foods. Foods with partially hydrogenated oils. Limit alcohol intake to no more than 1 drink a day for nonpregnant women and 2 drinks a day for men. One drink equals 12 oz of beer, 5 oz of wine, or 1 oz of hard liquor.  Reading food labels Check food labels for: Trans fats. Partially hydrogenated oils. Saturated fat (g) in each serving. Cholesterol (mg) in each serving. Fiber (g) in each serving. Choose foods with healthy fats, such as: Monounsaturated fats. Polyunsaturated fats. Omega-3 fats. Choose grain products that have whole grains. Look for the word "whole" as the first word in the ingredient list. Cooking Cook foods using low-fat methods. These include baking, boiling, grilling, and broiling. Eat more home-cooked foods. Eat at restaurants and buffets less often. Avoid cooking using saturated fats, such as butter, cream, palm oil, palm kernel oil, and coconut oil. Recommended foods  Fruits All fresh, canned (in natural juice), or frozen  fruits. Vegetables Fresh or frozen vegetables (raw, steamed, roasted, or grilled). Green salads. Grains Whole grains, such as whole wheat or whole grain breads, crackers, cereals, and pasta. Unsweetened oatmeal, bulgur, barley, quinoa, or brown rice. Corn or whole wheat flour tortillas. Meats and other protein foods Ground beef (85% or leaner), grass-fed beef, or beef trimmed of fat. Skinless chicken or turkey. Ground chicken or turkey. Pork trimmed of fat. All fish and seafood. Egg whites. Dried beans, peas, or lentils. Unsalted nuts or seeds. Unsalted canned beans. Nut butters without added sugar or oil. Dairy Low-fat or nonfat dairy products, such as skim or 1% milk, 2% or reduced-fat cheeses, low-fat and fat-free ricotta or cottage cheese, or plain low-fat and nonfat yogurt. Fats and oils Tub margarine without trans fats. Light or reduced-fat mayonnaise and salad dressings. Avocado. Olive, canola, sesame, or safflower oils. The items listed above may not be a complete list of foods and beverages youcan eat. Contact a dietitian for more information. Foods to avoid Fruits Canned fruit in heavy syrup. Fruit in cream or butter sauce. Fried fruit. Vegetables Vegetables cooked in cheese, cream, or butter sauce. Fried vegetables. Grains White bread. White pasta. White rice. Cornbread. Bagels, pastries, and croissants. Crackers and snack foods that contain trans fat and hydrogenated oils. Meats and other protein foods Fatty cuts of meat. Ribs, chicken wings, bacon, sausage, bologna, salami, chitterlings, fatback, hot dogs, bratwurst, and packaged lunch meats. Liver and organ meats. Whole eggs and egg yolks. Chicken and turkey with skin. Fried meat. Dairy Whole or 2% milk, cream, half-and-half, and cream cheese. Whole milk cheeses. Whole-fat or sweetened yogurt. Full-fat cheeses. Nondairy creamers and whipped toppings. Processed cheese, cheese spreads, and cheese curds. Beverages Alcohol.  Sugar-sweetened drinks such as sodas, lemonade, and fruit drinks. Fats and oils Butter, stick margarine, lard, shortening, ghee, or bacon fat. Coconut, palm kernel, and palm oils. Sweets and desserts Corn syrup, sugars, honey, and molasses. Candy. Jam and jelly. Syrup. Sweetened cereals. Cookies, pies, cakes, donuts, muffins, and ice cream. The items listed above may not be a complete list   of foods and beverages youshould avoid. Contact a dietitian for more information. Summary Choosing the right foods helps keep your fat and cholesterol at normal levels. This can keep you from getting certain diseases. At meals, fill one-half of your plate with vegetables and green salads. Eat high-fiber foods, like whole grains, beans, apples, carrots, peas, and barley. Limit added sugar, saturated fats, alcohol, and fried foods. This information is not intended to replace advice given to you by your health care provider. Make sure you discuss any questions you have with your healthcare provider. Document Revised: 10/12/2019 Document Reviewed: 10/12/2019 Else[employer] Patient Education  2022 [employer]  Calorie Counting for Weight Loss Calories are units of energy. Your body needs a certain number of calories from food to keep going throughout the day. When you eat or drink more calories than your body needs, your body stores the extra calories mostly as fat. When you eat or drink fewer calories than your body needs, your body burns fat to getthe energy it needs. Calorie counting means keeping track of how many calories you eat and drink each day. Calorie counting can be helpful if you need to lose weight. If you eat fewer calories than your body needs, you should lose weight. Ask yourhealth care provider what a healthy weight is for you. For calorie counting to work, you will need to eat the right number of calories each day to lose a healthy amount of weight per week. A dietitian can help you figure out how many  calories you need in a day and will suggest ways to reach your calorie goal. A healthy amount of weight to lose each week is usually 1-2 lb (0.5-0.9 kg). This usually means that your daily calorie intake should be reduced by 500-750 calories. Eating 1,200-1,500 calories a day can help most women lose weight. Eating 1,500-1,800 calories a day can help most men lose weight. What do I need to know about calorie counting? Work with your health care provider or dietitian to determine how many calories you should get each day. To meet your daily calorie goal, you will need to: Find out how many calories are in each food that you would like to eat. Try to do this before you eat. Decide how much of the food you plan to eat. Keep a food log. Do this by writing down what you ate and how many calories it had. To successfully lose weight, it is important to balance calorie counting with ahealthy lifestyle that includes regular activity. Where do I find calorie information?  The number of calories in a food can be found on a Nutrition Facts label. If a food does not have a Nutrition Facts label, try to look up the calories onlineor ask your dietitian for help. Remember that calories are listed per serving. If you choose to have more than one serving of a food, you will have to multiply the calories per serving by the number of servings you plan to eat. For example, the label on a package of bread might say that a serving size is 1 slice and that there are 90 calories in a serving. If you eat 1 slice, you will have eaten 90 calories. If you eat 2slices, you will have eaten 180 calories. How do I keep a food log? After each time that you eat, record the following in your food log as soon as possible: What you ate. Be sure to include toppings, sauces, and other extras on the food.   How much you ate. This can be measured in cups, ounces, or number of items. How many calories were in each food and drink. The total  number of calories in the food you ate. Keep your food log near you, such as in a pocket-sized notebook or on an app or website on your mobile phone. Some programs will calculate calories for you andshow you how many calories you have left to meet your daily goal. What are some portion-control tips? Know how many calories are in a serving. This will help you know how many servings you can have of a certain food. Use a measuring cup to measure serving sizes. You could also try weighing out portions on a kitchen scale. With time, you will be able to estimate serving sizes for some foods. Take time to put servings of different foods on your favorite plates or in your favorite bowls and cups so you know what a serving looks like. Try not to eat straight from a food's packaging, such as from a bag or box. Eating straight from the package makes it hard to see how much you are eating and can lead to overeating. Put the amount you would like to eat in a cup or on a plate to make sure you are eating the right portion. Use smaller plates, glasses, and bowls for smaller portions and to prevent overeating. Try not to multitask. For example, avoid watching TV or using your computer while eating. If it is time to eat, sit down at a table and enjoy your food. This will help you recognize when you are full. It will also help you be more mindful of what and how much you are eating. What are tips for following this plan? Reading food labels Check the calorie count compared with the serving size. The serving size may be smaller than what you are used to eating. Check the source of the calories. Try to choose foods that are high in protein, fiber, and vitamins, and low in saturated fat, trans fat, and sodium. Shopping Read nutrition labels while you shop. This will help you make healthy decisions about which foods to buy. Pay attention to nutrition labels for low-fat or fat-free foods. These foods sometimes have the same  number of calories or more calories than the full-fat versions. They also often have added sugar, starch, or salt to make up for flavor that was removed with the fat. Make a grocery list of lower-calorie foods and stick to it. Cooking Try to cook your favorite foods in a healthier way. For example, try baking instead of frying. Use low-fat dairy products. Meal planning Use more fruits and vegetables. One-half of your plate should be fruits and vegetables. Include lean proteins, such as chicken, turkey, and fish. Lifestyle Each week, aim to do one of the following: 150 minutes of moderate exercise, such as walking. 75 minutes of vigorous exercise, such as running. General information Know how many calories are in the foods you eat most often. This will help you calculate calorie counts faster. Find a way of tracking calories that works for you. Get creative. Try different apps or programs if writing down calories does not work for you. What foods should I eat?  Eat nutritious foods. It is better to have a nutritious, high-calorie food, such as an avocado, than a food with few nutrients, such as a bag of potato chips. Use your calories on foods and drinks that will fill you up and will not leave   you hungry soon after eating. Examples of foods that fill you up are nuts and nut butters, vegetables, lean proteins, and high-fiber foods such as whole grains. High-fiber foods are foods with more than 5 g of fiber per serving. Pay attention to calories in drinks. Low-calorie drinks include water and unsweetened drinks. The items listed above may not be a complete list of foods and beverages you can eat. Contact a dietitian for more information. What foods should I limit? Limit foods or drinks that are not good sources of vitamins, minerals, or protein or that are high in unhealthy fats. These include: Candy. Other sweets. Sodas, specialty coffee drinks, alcohol, and juice. The items listed above  may not be a complete list of foods and beverages you should avoid. Contact a dietitian for more information. How do I count calories when eating out? Pay attention to portions. Often, portions are much larger when eating out. Try these tips to keep portions smaller: Consider sharing a meal instead of getting your own. If you get your own meal, eat only half of it. Before you start eating, ask for a container and put half of your meal into it. When available, consider ordering smaller portions from the menu instead of full portions. Pay attention to your food and drink choices. Knowing the way food is cooked and what is included with the meal can help you eat fewer calories. If calories are listed on the menu, choose the lower-calorie options. Choose dishes that include vegetables, fruits, whole grains, low-fat dairy products, and lean proteins. Choose items that are boiled, broiled, grilled, or steamed. Avoid items that are buttered, battered, fried, or served with cream sauce. Items labeled as crispy are usually fried, unless stated otherwise. Choose water, low-fat milk, unsweetened iced tea, or other drinks without added sugar. If you want an alcoholic beverage, choose a lower-calorie option, such as a glass of wine or light beer. Ask for dressings, sauces, and syrups on the side. These are usually high in calories, so you should limit the amount you eat. If you want a salad, choose a garden salad and ask for grilled meats. Avoid extra toppings such as bacon, cheese, or fried items. Ask for the dressing on the side, or ask for olive oil and vinegar or lemon to use as dressing. Estimate how many servings of a food you are given. Knowing serving sizes will help you be aware of how much food you are eating at restaurants. Where to find more information Centers for Disease Control and Prevention: www.cdc.gov [place] Department of Agriculture: myplate.gov Summary Calorie counting means keeping track of  how many calories you eat and drink each day. If you eat fewer calories than your body needs, you should lose weight. A healthy amount of weight to lose per week is usually 1-2 lb (0.5-0.9 kg). This usually means reducing your daily calorie intake by 500-750 calories. The number of calories in a food can be found on a Nutrition Facts label. If a food does not have a Nutrition Facts label, try to look up the calories online or ask your dietitian for help. Use smaller plates, glasses, and bowls for smaller portions and to prevent overeating. Use your calories on foods and drinks that will fill you up and not leave you hungry shortly after a meal. This information is not intended to replace advice given to you by your health care provider. Make sure you discuss any questions you have with your healthcare provider. Document Revised: 07/21/2019 Document   Reviewed: 07/21/2019 Elsevier Patient Education  2022 [employer]  Fat and Cholesterol Restricted Eating Plan Getting too much fat and cholesterol in your diet may cause health problems. Choosing the right foods helps keep your fat and cholesterol at normal levels.This can keep you from getting certain diseases. Your doctor may recommend an eating plan that includes: Total fat: ______% or less of total calories a day. Saturated fat: ______% or less of total calories a day. Cholesterol: less than _________mg a day. Fiber: ______g a day. What are tips for following this plan? Meal planning At meals, divide your plate into four equal parts: Fill one-half of your plate with vegetables and green salads. Fill one-fourth of your plate with whole grains. Fill one-fourth of your plate with low-fat (lean) protein foods. Eat fish that is high in omega-3 fats at least two times a week. This includes mackerel, tuna, sardines, and salmon. Eat foods that are high in fiber, such as whole grains, beans, apples, broccoli, carrots, peas, and barley. General  tips  Work with your doctor to lose weight if you need to. Avoid: Foods with added sugar. Fried foods. Foods with partially hydrogenated oils. Limit alcohol intake to no more than 1 drink a day for nonpregnant women and 2 drinks a day for men. One drink equals 12 oz of beer, 5 oz of wine, or 1 oz of hard liquor.  Reading food labels Check food labels for: Trans fats. Partially hydrogenated oils. Saturated fat (g) in each serving. Cholesterol (mg) in each serving. Fiber (g) in each serving. Choose foods with healthy fats, such as: Monounsaturated fats. Polyunsaturated fats. Omega-3 fats. Choose grain products that have whole grains. Look for the word "whole" as the first word in the ingredient list. Cooking Cook foods using low-fat methods. These include baking, boiling, grilling, and broiling. Eat more home-cooked foods. Eat at restaurants and buffets less often. Avoid cooking using saturated fats, such as butter, cream, palm oil, palm kernel oil, and coconut oil. Recommended foods  Fruits All fresh, canned (in natural juice), or frozen fruits. Vegetables Fresh or frozen vegetables (raw, steamed, roasted, or grilled). Green salads. Grains Whole grains, such as whole wheat or whole grain breads, crackers, cereals, and pasta. Unsweetened oatmeal, bulgur, barley, quinoa, or brown rice. Corn or whole wheat flour tortillas. Meats and other protein foods Ground beef (85% or leaner), grass-fed beef, or beef trimmed of fat. Skinless chicken or turkey. Ground chicken or turkey. Pork trimmed of fat. All fish and seafood. Egg whites. Dried beans, peas, or lentils. Unsalted nuts or seeds. Unsalted canned beans. Nut butters without added sugar or oil. Dairy Low-fat or nonfat dairy products, such as skim or 1% milk, 2% or reduced-fat cheeses, low-fat and fat-free ricotta or cottage cheese, or plain low-fat and nonfat yogurt. Fats and oils Tub margarine without trans fats. Light or  reduced-fat mayonnaise and salad dressings. Avocado. Olive, canola, sesame, or safflower oils. The items listed above may not be a complete list of foods and beverages youcan eat. Contact a dietitian for more information. Foods to avoid Fruits Canned fruit in heavy syrup. Fruit in cream or butter sauce. Fried fruit. Vegetables Vegetables cooked in cheese, cream, or butter sauce. Fried vegetables. Grains White bread. White pasta. White rice. Cornbread. Bagels, pastries, and croissants. Crackers and snack foods that contain trans fat and hydrogenated oils. Meats and other protein foods Fatty cuts of meat. Ribs, chicken wings, bacon, sausage, bologna, salami, chitterlings, fatback, hot dogs, bratwurst, and packaged lunch meats. Liver   and organ meats. Whole eggs and egg yolks. Chicken and turkey with skin. Fried meat. Dairy Whole or 2% milk, cream, half-and-half, and cream cheese. Whole milk cheeses. Whole-fat or sweetened yogurt. Full-fat cheeses. Nondairy creamers and whipped toppings. Processed cheese, cheese spreads, and cheese curds. Beverages Alcohol. Sugar-sweetened drinks such as sodas, lemonade, and fruit drinks. Fats and oils Butter, stick margarine, lard, shortening, ghee, or bacon fat. Coconut, palm kernel, and palm oils. Sweets and desserts Corn syrup, sugars, honey, and molasses. Candy. Jam and jelly. Syrup. Sweetened cereals. Cookies, pies, cakes, donuts, muffins, and ice cream. The items listed above may not be a complete list of foods and beverages youshould avoid. Contact a dietitian for more information. Summary Choosing the right foods helps keep your fat and cholesterol at normal levels. This can keep you from getting certain diseases. At meals, fill one-half of your plate with vegetables and green salads. Eat high-fiber foods, like whole grains, beans, apples, carrots, peas, and barley. Limit added sugar, saturated fats, alcohol, and fried foods. This information is not  intended to replace advice given to you by your health care provider. Make sure you discuss any questions you have with your healthcare provider. Document Revised: 10/12/2019 Document Reviewed: 10/12/2019 Elsevier Patient Education  2022 Elsevier Inc.  

## 2021-01-13 ENCOUNTER — Ambulatory Visit
Admission: EM | Admit: 2021-01-13 | Discharge: 2021-01-13 | Disposition: A | Discharge status: Home / Self Care | Payer: BC Managed Care – PPO | Attending: Urgent Care | Admitting: Urgent Care

## 2021-01-13 DIAGNOSIS — R070 Pain in throat: Secondary | ICD-10-CM | POA: Insufficient documentation

## 2021-01-13 DIAGNOSIS — R519 Headache, unspecified: Secondary | ICD-10-CM | POA: Diagnosis present

## 2021-01-13 DIAGNOSIS — Z20822 Contact with and (suspected) exposure to covid-19: Secondary | ICD-10-CM | POA: Insufficient documentation

## 2021-01-13 DIAGNOSIS — B349 Viral infection, unspecified: Secondary | ICD-10-CM | POA: Diagnosis not present

## 2021-01-13 LAB — POCT RAPID STREP A (OFFICE): Rapid Strep A Screen: NEGATIVE

## 2021-01-13 MED ORDER — CETIRIZINE HCL 10 MG PO TABS: 10.0000 mg | ORAL_TABLET | Freq: Every day | ORAL | 0 refills | Status: DC

## 2021-01-13 MED ORDER — FLUTICASONE PROPIONATE 50 MCG/ACT NA SUSP: 2.0000 | Freq: Every day | NASAL | 12 refills | Status: DC

## 2021-01-13 MED ORDER — PSEUDOEPHEDRINE HCL 60 MG PO TABS: 60.0000 mg | ORAL_TABLET | Freq: Three times a day (TID) | ORAL | 0 refills | Status: DC | PRN

## 2021-01-13 NOTE — Discharge Instructions (Addendum)

## 2021-01-13 NOTE — ED Triage Notes (Signed)
Pt presents with sore throat X 3 days.  Pt had close positive covid exposure X 4 days ago.

## 2021-01-13 NOTE — ED Provider Notes (Signed)
[facility]   MRN: [id number] DOB: 05/23/1986  Subjective:   Allison Fischer is a 35 y.o. female presenting for 3-day history of acute onset throat pain, headaches, malaise and fatigue.  Patient presents with her husband and 2 children who are also sick.  They had light exposure to COVID-19 through family member.  She is vaccinated with her booster.  Denies chest pain, shortness of breath.  She is not a smoker.  She does have a history of allergic rhinitis as well.  Does not take medications for this.  No current facility-administered medications for this encounter.  Current Outpatient Medications:    phentermine 15 MG capsule, Take 1 capsule (15 mg total) by mouth every morning., Disp: 30 capsule, Rfl: 0   Allergies  Allergen Reactions   Penicillins Rash    Has patient had a PCN reaction causing immediate rash, facial/tongue/throat swelling, SOB or lightheadedness with hypotension: Yes Has patient had a PCN reaction causing severe rash involving mucus membranes or skin necrosis: No Has patient had a PCN reaction that required hospitalization No Has patient had a PCN reaction occurring within the last 10 years: No If all of the above answers are "NO", then may proceed with Cephalosporin use.    Tape Rash    Adnesive= itching and bumps    Past Medical History:  Diagnosis Date   Allergy    Medical history non-contributory      Past Surgical History:  Procedure Laterality Date   APPENDECTOMY N/A    Phreesia 09/24/2020   EYE SURGERY Bilateral 12/2007   cornea sx to correct vision   LAPAROSCOPIC APPENDECTOMY N/A 04/21/2017   Procedure: APPENDECTOMY LAPAROSCOPIC;  Surgeon: Kinsinger, De Blanch, MD;  Location: [facility];  Service: General;  Laterality: N/A;   LAPAROSCOPIC TUBAL LIGATION Bilateral 03/23/2020   Procedure: LAPAROSCOPIC TUBAL LIGATION with filshie clips;  Surgeon: Mitchel Honour, DO;  Location: [facility];  Service: Gynecology;  Laterality:  Bilateral;   TUBAL LIGATION N/A    Phreesia 09/24/2020    Family History  Problem Relation Age of Onset   High Cholesterol Mother    High blood pressure Father    Hypertension Maternal Grandmother    Cancer Maternal Grandmother    Heart attack Maternal Grandfather    High blood pressure Maternal Grandfather    High blood pressure Paternal Grandmother    Heart attack Paternal Grandfather    High blood pressure Paternal Grandfather     Social History   Tobacco Use   Smoking status: Never   Smokeless tobacco: Never  Vaping Use   Vaping Use: Never used  Substance Use Topics   Alcohol use: Yes    Alcohol/week: 0.0 standard drinks    Comment: occ weekends   Drug use: No    ROS   Objective:   Vitals: BP 99/65 (BP Location: Left Arm)   Pulse 87   Temp 97.7 F (36.5 C) (Oral)   Resp 20   LMP 12/14/2020 (Approximate)   SpO2 98%   Physical Exam Constitutional:      General: She is not in acute distress.    Appearance: Normal appearance. She is well-developed. She is not ill-appearing, toxic-appearing or diaphoretic.  HENT:     Head: Normocephalic and atraumatic.     Nose: Nose normal.     Mouth/Throat:     Mouth: Mucous membranes are moist.     Pharynx: No oropharyngeal exudate or posterior oropharyngeal erythema.  Eyes:     General: No  scleral icterus.    Extraocular Movements: Extraocular movements intact.     Pupils: Pupils are equal, round, and reactive to light.  Cardiovascular:     Rate and Rhythm: Normal rate and regular rhythm.     Pulses: Normal pulses.     Heart sounds: Normal heart sounds. No murmur heard.   No friction rub. No gallop.  Pulmonary:     Effort: Pulmonary effort is normal. No respiratory distress.     Breath sounds: Normal breath sounds. No stridor. No wheezing, rhonchi or rales.  Skin:    General: Skin is warm and dry.     Findings: No rash.  Neurological:     General: No focal deficit present.     Mental Status: She is alert and  oriented to person, place, and time.     Cranial Nerves: No cranial nerve deficit.     Motor: No weakness.     Coordination: Coordination normal.     Gait: Gait normal.  Psychiatric:        Mood and Affect: Mood normal.        Behavior: Behavior normal.        Thought Content: Thought content normal.        Judgment: Judgment normal.   Results for orders placed or performed during the hospital encounter of 01/13/21 (from the past 24 hour(s))  POCT rapid strep A     Status: None   Collection Time: 01/13/21 11:35 AM  Result Value Ref Range   Rapid Strep A Screen Negative Negative    Assessment and Plan :   PDMP not reviewed this encounter.  1. Viral syndrome   2. Exposure to COVID-19 virus   3. Throat pain   4. Sinus headache     Will manage for viral illness such as viral URI, viral syndrome, viral rhinitis, COVID-19. Counseled patient on nature of COVID-19 including modes of transmission, diagnostic testing, management and supportive care.  Offered scripts for symptomatic relief. COVID 19 and strep culture are pending. Counseled patient on potential for adverse effects with medications prescribed/recommended today, ER and return-to-clinic precautions discussed, patient verbalized understanding.     Wallis Bamberg, PA-C 01/13/21 1152

## 2021-01-14 LAB — SARS-COV-2, NAA 2 DAY TAT

## 2021-01-14 LAB — NOVEL CORONAVIRUS, NAA: SARS-CoV-2, NAA: NOT DETECTED

## 2021-01-15 LAB — CULTURE, GROUP A STREP (THRC)

## 2021-01-31 ENCOUNTER — Other Ambulatory Visit: Payer: Self-pay

## 2021-01-31 ENCOUNTER — Encounter: Payer: Self-pay | Admitting: Nurse Practitioner

## 2021-01-31 ENCOUNTER — Ambulatory Visit (INDEPENDENT_AMBULATORY_CARE_PROVIDER_SITE_OTHER): Payer: BC Managed Care – PPO | Admitting: Nurse Practitioner

## 2021-01-31 VITALS — BP 100/88 | HR 72 | Temp 98.1°F | Ht 60.25 in | Wt 188.9 lb

## 2021-01-31 DIAGNOSIS — Z6834 Body mass index (BMI) 34.0-34.9, adult: Secondary | ICD-10-CM

## 2021-01-31 DIAGNOSIS — M545 Low back pain, unspecified: Secondary | ICD-10-CM | POA: Diagnosis not present

## 2021-01-31 MED ORDER — PHENTERMINE HCL 15 MG PO CAPS: 15.0000 mg | ORAL_CAPSULE | ORAL | 0 refills | Status: DC

## 2021-01-31 NOTE — Progress Notes (Signed)
Established Patient Office Visit  Subjective:  Patient ID: Allison Fischer, female    DOB: 08-Aug-1985  Age: 35 y.o. MRN: [id number]  CC:  Chief Complaint  Patient presents with   Weight Check    HPI Allison Fischer presents for follow-up of weight management.  She is taking phentermine 15 mg capsules.  She has lost 4 pounds since her last visit.  She has lost a total of 17 pounds since starting on phentermine.  States that she had to take a break for about 2 weeks.  Started having moderate to severe back pain and stopped going to the gym for this time period.  She has been seeing a massage therapist and chiropractor.  This is gradually improving and she started going back to the gym this past Monday.  She denies problems, concerns, complaints regarding taking phentermine for weight management.  She continues to eat a low calorie diet.  She generally eats between 1000 and 1200 cal/day.  Past Medical History:  Diagnosis Date   Allergy    Medical history non-contributory     Past Surgical History:  Procedure Laterality Date   APPENDECTOMY N/A    Phreesia 09/24/2020   EYE SURGERY Bilateral 12/2007   cornea sx to correct vision   LAPAROSCOPIC APPENDECTOMY N/A 04/21/2017   Procedure: APPENDECTOMY LAPAROSCOPIC;  Surgeon: Kinsinger, Arta Bruce, MD;  Location: [place] [facility];  Service: General;  Laterality: N/A;   LAPAROSCOPIC TUBAL LIGATION Bilateral 03/23/2020   Procedure: LAPAROSCOPIC TUBAL LIGATION with filshie clips;  Surgeon: Linda Hedges, DO;  Location: [facility];  Service: Gynecology;  Laterality: Bilateral;   TUBAL LIGATION N/A    Phreesia 09/24/2020    Family History  Problem Relation Age of Onset   High Cholesterol Mother    High blood pressure Father    Hypertension Maternal Grandmother    Cancer Maternal Grandmother    Heart attack Maternal Grandfather    High blood pressure Maternal Grandfather    High blood pressure Paternal Grandmother    Heart attack Paternal  Grandfather    High blood pressure Paternal Grandfather     Social History   Socioeconomic History   Marital status: Married    Spouse name: Not on file   Number of children: Not on file   Years of education: Not on file   Highest education level: Not on file  Occupational History   Not on file  Tobacco Use   Smoking status: Never   Smokeless tobacco: Never  Vaping Use   Vaping Use: Never used  Substance and Sexual Activity   Alcohol use: Yes    Alcohol/week: 0.0 standard drinks    Comment: occ weekends   Drug use: No   Sexual activity: Yes  Other Topics Concern   Not on file  Social History Narrative   Not on file   Social Determinants of Health   Financial Resource Strain: Not on file  Food Insecurity: Not on file  Transportation Needs: Not on file  Physical Activity: Not on file  Stress: Not on file  Social Connections: Not on file  Intimate Partner Violence: Not on file    Outpatient Medications Prior to Visit  Medication Sig Dispense Refill   cetirizine (ZYRTEC ALLERGY) 10 MG tablet Take 1 tablet (10 mg total) by mouth daily. 30 tablet 0   pseudoephedrine (SUDAFED) 60 MG tablet Take 1 tablet (60 mg total) by mouth every 8 (eight) hours as needed for congestion. 30 tablet 0   phentermine  15 MG capsule Take 1 capsule (15 mg total) by mouth every morning. 30 capsule 0   fluticasone (FLONASE) 50 MCG/ACT nasal spray Place 2 sprays into both nostrils daily. (Patient not taking: Reported on 01/31/2021) 16 g 12   No facility-administered medications prior to visit.    Allergies  Allergen Reactions   Penicillins Rash    Has patient had a PCN reaction causing immediate rash, facial/tongue/throat swelling, SOB or lightheadedness with hypotension: Yes Has patient had a PCN reaction causing severe rash involving mucus membranes or skin necrosis: No Has patient had a PCN reaction that required hospitalization No Has patient had a PCN reaction occurring within the last  10 years: No If all of the above answers are "NO", then may proceed with Cephalosporin use.    Tape Rash    Adnesive= itching and bumps    ROS Review of Systems  Constitutional:  Negative for chills, fatigue and fever.       4 pound weight loss since last visit.  17 pound weight loss since starting on phentermine in April, 2022.  HENT:  Negative for congestion, postnasal drip, rhinorrhea, sinus pressure and sinus pain.   Eyes: Negative.   Respiratory:  Negative for chest tightness and shortness of breath.   Cardiovascular:  Negative for chest pain and palpitations.  Gastrointestinal:  Negative for constipation, diarrhea, nausea and vomiting.  Endocrine: Negative for cold intolerance, heat intolerance, polydipsia and polyuria.  Musculoskeletal:  Positive for back pain and myalgias. Negative for arthralgias.  Skin:  Negative for rash.  Allergic/Immunologic: Negative.   Neurological:  Negative for dizziness, weakness and headaches.  Hematological: Negative.   Psychiatric/Behavioral:  Negative for dysphoric mood and sleep disturbance. The patient is not nervous/anxious.      Objective:    Physical Exam Vitals and nursing note reviewed.  Constitutional:      Appearance: Normal appearance. She is well-developed. She is obese.  HENT:     Head: Normocephalic and atraumatic.     Nose: Nose normal.     Mouth/Throat:     Mouth: Mucous membranes are moist.  Eyes:     Extraocular Movements: Extraocular movements intact.     Conjunctiva/sclera: Conjunctivae normal.     Pupils: Pupils are equal, round, and reactive to light.  Cardiovascular:     Rate and Rhythm: Normal rate and regular rhythm.     Pulses: Normal pulses.     Heart sounds: Normal heart sounds.  Pulmonary:     Effort: Pulmonary effort is normal.     Breath sounds: Normal breath sounds.  Abdominal:     Palpations: Abdomen is soft.  Musculoskeletal:        General: Normal range of motion.     Cervical back: Normal  range of motion and neck supple.     Comments: Mild lower back pain, worse with bending and twisting at the waist.  Range of motion is intact though guarded.  No bony abnormality or deformity noted at this time.  Lymphadenopathy:     Cervical: No cervical adenopathy.  Skin:    General: Skin is warm and dry.     Capillary Refill: Capillary refill takes less than 2 seconds.  Neurological:     General: No focal deficit present.     Mental Status: She is alert and oriented to person, place, and time.  Psychiatric:        Mood and Affect: Mood normal.        Behavior: Behavior normal.  Thought Content: Thought content normal.        Judgment: Judgment normal.    Today's Vitals   01/31/21 1623  BP: 100/88  Pulse: 72  Temp: 98.1 F (36.7 C)  SpO2: 100%  Weight: 188 lb 14.4 oz (85.7 kg)  Height: 5' 0.25" (1.53 m)   Body mass index is 36.59 kg/m.   Wt Readings from Last 3 Encounters:  01/31/21 188 lb 14.4 oz (85.7 kg)  01/02/21 192 lb 8 oz (87.3 kg)  12/05/20 192 lb 9.6 oz (87.4 kg)     Health Maintenance Due  Topic Date Due   Hepatitis C Screening  Never done   INFLUENZA VACCINE  01/21/2021    There are no preventive care reminders to display for this patient.  Lab Results  Component Value Date   TSH 0.426 (L) 11/01/2020   Lab Results  Component Value Date   WBC 5.6 11/01/2020   HGB 13.2 11/01/2020   HCT 40.0 11/01/2020   MCV 87 11/01/2020   PLT 296 11/01/2020   Lab Results  Component Value Date   NA 137 11/01/2020   K 5.0 11/01/2020   CO2 23 11/01/2020   GLUCOSE 96 11/01/2020   BUN 12 11/01/2020   CREATININE 0.84 11/01/2020   BILITOT 0.7 11/01/2020   ALKPHOS 62 11/01/2020   AST 11 11/01/2020   ALT 9 11/01/2020   PROT 6.1 11/01/2020   ALBUMIN 3.9 11/01/2020   CALCIUM 9.3 11/01/2020   ANIONGAP 11 09/28/2017   EGFR 94 11/01/2020   Lab Results  Component Value Date   CHOL 173 11/01/2020   Lab Results  Component Value Date   HDL 59 11/01/2020    Lab Results  Component Value Date   LDLCALC 102 (H) 11/01/2020   Lab Results  Component Value Date   TRIG 62 11/01/2020   Lab Results  Component Value Date   CHOLHDL 2.9 11/01/2020   Lab Results  Component Value Date   HGBA1C 5.4 11/01/2020      Assessment & Plan:  1. Acute midline low back pain without sciatica Patient should take ibuprofen and/or Tylenol as needed for pain and inflammation.  She should gradually go back to his routine physical activity.  Reviewed exercises and stretches that can be done to help relieve back pain.  Written information was given to support discussion.  2. Body mass index (BMI) of 34.0-34.9 in adult Improving.  Patient may continue to take phentermine 15 mg capsules daily.  She should limit calorie intake to 1200 to 1500 cal/day.  She should gradually reincorporate exercise into her daily routine. - phentermine 15 MG capsule; Take 1 capsule (15 mg total) by mouth every morning.  Dispense: 30 capsule; Refill: 0   Problem List Items Addressed This Visit       Other   Body mass index (BMI) of 34.0-34.9 in adult   Relevant Medications   phentermine 15 MG capsule   Acute midline low back pain without sciatica - Primary    Meds ordered this encounter  Medications   phentermine 15 MG capsule    Sig: Take 1 capsule (15 mg total) by mouth every morning.    Dispense:  30 capsule    Refill:  0    Order Specific Question:   Supervising Provider    Answer:   Beatrice Lecher D [[id number]]   This note was dictated using Dragon Voice Recognition Software. Rapid proofreading was performed to expedite the delivery of the information. Despite proofreading, phonetic  errors will occur which are common with this voice recognition software. Please take this into consideration. If there are any concerns, please contact our office.    Follow-up: Return in about 4 weeks (around 02/28/2021).    Ronnell Freshwater, NP

## 2021-02-09 DIAGNOSIS — M545 Low back pain, unspecified: Secondary | ICD-10-CM | POA: Insufficient documentation

## 2021-02-09 NOTE — Patient Instructions (Signed)
Calorie Counting for Weight Loss Calories are units of energy. Your body needs a certain number of calories from food to keep going throughout the day. When you eat or drink more calories than your body needs, your body stores the extra calories mostly as fat. When you eat or drink fewer calories than your body needs, your body burns fat to getthe energy it needs. Calorie counting means keeping track of how many calories you eat and drink each day. Calorie counting can be helpful if you need to lose weight. If you eat fewer calories than your body needs, you should lose weight. Ask yourhealth care provider what a healthy weight is for you. For calorie counting to work, you will need to eat the right number of calories each day to lose a healthy amount of weight per week. A dietitian can help you figure out how many calories you need in a day and will suggest ways to reach your calorie goal. A healthy amount of weight to lose each week is usually 1-2 lb (0.5-0.9 kg). This usually means that your daily calorie intake should be reduced by 500-750 calories. Eating 1,200-1,500 calories a day can help most women lose weight. Eating 1,500-1,800 calories a day can help most men lose weight. What do I need to know about calorie counting? Work with your health care provider or dietitian to determine how many calories you should get each day. To meet your daily calorie goal, you will need to: Find out how many calories are in each food that you would like to eat. Try to do this before you eat. Decide how much of the food you plan to eat. Keep a food log. Do this by writing down what you ate and how many calories it had. To successfully lose weight, it is important to balance calorie counting with ahealthy lifestyle that includes regular activity. Where do I find calorie information?  The number of calories in a food can be found on a Nutrition Facts label. If a food does not have a Nutrition Facts label, try to  look up the calories onlineor ask your dietitian for help. Remember that calories are listed per serving. If you choose to have more than one serving of a food, you will have to multiply the calories per serving by the number of servings you plan to eat. For example, the label on a package of bread might say that a serving size is 1 slice and that there are 90 calories in a serving. If you eat 1 slice, you will have eaten 90 calories. If you eat 2slices, you will have eaten 180 calories. How do I keep a food log? After each time that you eat, record the following in your food log as soon as possible: What you ate. Be sure to include toppings, sauces, and other extras on the food. How much you ate. This can be measured in cups, ounces, or number of items. How many calories were in each food and drink. The total number of calories in the food you ate. Keep your food log near you, such as in a pocket-sized notebook or on an app or website on your mobile phone. Some programs will calculate calories for you andshow you how many calories you have left to meet your daily goal. What are some portion-control tips? Know how many calories are in a serving. This will help you know how many servings you can have of a certain food. Use a measuring cup to   measure serving sizes. You could also try weighing out portions on a kitchen scale. With time, you will be able to estimate serving sizes for some foods. Take time to put servings of different foods on your favorite plates or in your favorite bowls and cups so you know what a serving looks like. Try not to eat straight from a food's packaging, such as from a bag or box. Eating straight from the package makes it hard to see how much you are eating and can lead to overeating. Put the amount you would like to eat in a cup or on a plate to make sure you are eating the right portion. Use smaller plates, glasses, and bowls for smaller portions and to prevent  overeating. Try not to multitask. For example, avoid watching TV or using your computer while eating. If it is time to eat, sit down at a table and enjoy your food. This will help you recognize when you are full. It will also help you be more mindful of what and how much you are eating. What are tips for following this plan? Reading food labels Check the calorie count compared with the serving size. The serving size may be smaller than what you are used to eating. Check the source of the calories. Try to choose foods that are high in protein, fiber, and vitamins, and low in saturated fat, trans fat, and sodium. Shopping Read nutrition labels while you shop. This will help you make healthy decisions about which foods to buy. Pay attention to nutrition labels for low-fat or fat-free foods. These foods sometimes have the same number of calories or more calories than the full-fat versions. They also often have added sugar, starch, or salt to make up for flavor that was removed with the fat. Make a grocery list of lower-calorie foods and stick to it. Cooking Try to cook your favorite foods in a healthier way. For example, try baking instead of frying. Use low-fat dairy products. Meal planning Use more fruits and vegetables. One-half of your plate should be fruits and vegetables. Include lean proteins, such as chicken, turkey, and fish. Lifestyle Each week, aim to do one of the following: 150 minutes of moderate exercise, such as walking. 75 minutes of vigorous exercise, such as running. General information Know how many calories are in the foods you eat most often. This will help you calculate calorie counts faster. Find a way of tracking calories that works for you. Get creative. Try different apps or programs if writing down calories does not work for you. What foods should I eat?  Eat nutritious foods. It is better to have a nutritious, high-calorie food, such as an avocado, than a food with  few nutrients, such as a bag of potato chips. Use your calories on foods and drinks that will fill you up and will not leave you hungry soon after eating. Examples of foods that fill you up are nuts and nut butters, vegetables, lean proteins, and high-fiber foods such as whole grains. High-fiber foods are foods with more than 5 g of fiber per serving. Pay attention to calories in drinks. Low-calorie drinks include water and unsweetened drinks. The items listed above may not be a complete list of foods and beverages you can eat. Contact a dietitian for more information. What foods should I limit? Limit foods or drinks that are not good sources of vitamins, minerals, or protein or that are high in unhealthy fats. These include: Candy. Other sweets. Sodas, specialty   coffee drinks, alcohol, and juice. The items listed above may not be a complete list of foods and beverages you should avoid. Contact a dietitian for more information. How do I count calories when eating out? Pay attention to portions. Often, portions are much larger when eating out. Try these tips to keep portions smaller: Consider sharing a meal instead of getting your own. If you get your own meal, eat only half of it. Before you start eating, ask for a container and put half of your meal into it. When available, consider ordering smaller portions from the menu instead of full portions. Pay attention to your food and drink choices. Knowing the way food is cooked and what is included with the meal can help you eat fewer calories. If calories are listed on the menu, choose the lower-calorie options. Choose dishes that include vegetables, fruits, whole grains, low-fat dairy products, and lean proteins. Choose items that are boiled, broiled, grilled, or steamed. Avoid items that are buttered, battered, fried, or served with cream sauce. Items labeled as crispy are usually fried, unless stated otherwise. Choose water, low-fat milk,  unsweetened iced tea, or other drinks without added sugar. If you want an alcoholic beverage, choose a lower-calorie option, such as a glass of wine or light beer. Ask for dressings, sauces, and syrups on the side. These are usually high in calories, so you should limit the amount you eat. If you want a salad, choose a garden salad and ask for grilled meats. Avoid extra toppings such as bacon, cheese, or fried items. Ask for the dressing on the side, or ask for olive oil and vinegar or lemon to use as dressing. Estimate how many servings of a food you are given. Knowing serving sizes will help you be aware of how much food you are eating at restaurants. Where to find more information Centers for Disease Control and Prevention: www.cdc.gov [place] Department of Agriculture: myplate.gov Summary Calorie counting means keeping track of how many calories you eat and drink each day. If you eat fewer calories than your body needs, you should lose weight. A healthy amount of weight to lose per week is usually 1-2 lb (0.5-0.9 kg). This usually means reducing your daily calorie intake by 500-750 calories. The number of calories in a food can be found on a Nutrition Facts label. If a food does not have a Nutrition Facts label, try to look up the calories online or ask your dietitian for help. Use smaller plates, glasses, and bowls for smaller portions and to prevent overeating. Use your calories on foods and drinks that will fill you up and not leave you hungry shortly after a meal. This information is not intended to replace advice given to you by your health care provider. Make sure you discuss any questions you have with your healthcare provider. Document Revised: 07/21/2019 Document Reviewed: 07/21/2019 Elsevier Patient Education  2022 Else[employer] Inc.  Fat and Cholesterol Restricted Eating Plan Getting too much fat and cholesterol in your diet may cause health problems. Choosing the right foods helps keep  your fat and cholesterol at normal levels.This can keep you from getting certain diseases. Your doctor may recommend an eating plan that includes: Total fat: ______% or less of total calories a day. Saturated fat: ______% or less of total calories a day. Cholesterol: less than _________mg a day. Fiber: ______g a day. What are tips for following this plan? Meal planning At meals, divide your plate into four equal parts: Fill one-half of   your plate with vegetables and green salads. Fill one-fourth of your plate with whole grains. Fill one-fourth of your plate with low-fat (lean) protein foods. Eat fish that is high in omega-3 fats at least two times a week. This includes mackerel, tuna, sardines, and salmon. Eat foods that are high in fiber, such as whole grains, beans, apples, broccoli, carrots, peas, and barley. General tips  Work with your doctor to lose weight if you need to. Avoid: Foods with added sugar. Fried foods. Foods with partially hydrogenated oils. Limit alcohol intake to no more than 1 drink a day for nonpregnant women and 2 drinks a day for men. One drink equals 12 oz of beer, 5 oz of wine, or 1 oz of hard liquor.  Reading food labels Check food labels for: Trans fats. Partially hydrogenated oils. Saturated fat (g) in each serving. Cholesterol (mg) in each serving. Fiber (g) in each serving. Choose foods with healthy fats, such as: Monounsaturated fats. Polyunsaturated fats. Omega-3 fats. Choose grain products that have whole grains. Look for the word "whole" as the first word in the ingredient list. Cooking Cook foods using low-fat methods. These include baking, boiling, grilling, and broiling. Eat more home-cooked foods. Eat at restaurants and buffets less often. Avoid cooking using saturated fats, such as butter, cream, palm oil, palm kernel oil, and coconut oil. Recommended foods  Fruits All fresh, canned (in natural juice), or frozen  fruits. Vegetables Fresh or frozen vegetables (raw, steamed, roasted, or grilled). Green salads. Grains Whole grains, such as whole wheat or whole grain breads, crackers, cereals, and pasta. Unsweetened oatmeal, bulgur, barley, quinoa, or brown rice. Corn or whole wheat flour tortillas. Meats and other protein foods Ground beef (85% or leaner), grass-fed beef, or beef trimmed of fat. Skinless chicken or turkey. Ground chicken or turkey. Pork trimmed of fat. All fish and seafood. Egg whites. Dried beans, peas, or lentils. Unsalted nuts or seeds. Unsalted canned beans. Nut butters without added sugar or oil. Dairy Low-fat or nonfat dairy products, such as skim or 1% milk, 2% or reduced-fat cheeses, low-fat and fat-free ricotta or cottage cheese, or plain low-fat and nonfat yogurt. Fats and oils Tub margarine without trans fats. Light or reduced-fat mayonnaise and salad dressings. Avocado. Olive, canola, sesame, or safflower oils. The items listed above may not be a complete list of foods and beverages youcan eat. Contact a dietitian for more information. Foods to avoid Fruits Canned fruit in heavy syrup. Fruit in cream or butter sauce. Fried fruit. Vegetables Vegetables cooked in cheese, cream, or butter sauce. Fried vegetables. Grains White bread. White pasta. White rice. Cornbread. Bagels, pastries, and croissants. Crackers and snack foods that contain trans fat and hydrogenated oils. Meats and other protein foods Fatty cuts of meat. Ribs, chicken wings, bacon, sausage, bologna, salami, chitterlings, fatback, hot dogs, bratwurst, and packaged lunch meats. Liver and organ meats. Whole eggs and egg yolks. Chicken and turkey with skin. Fried meat. Dairy Whole or 2% milk, cream, half-and-half, and cream cheese. Whole milk cheeses. Whole-fat or sweetened yogurt. Full-fat cheeses. Nondairy creamers and whipped toppings. Processed cheese, cheese spreads, and cheese curds. Beverages Alcohol.  Sugar-sweetened drinks such as sodas, lemonade, and fruit drinks. Fats and oils Butter, stick margarine, lard, shortening, ghee, or bacon fat. Coconut, palm kernel, and palm oils. Sweets and desserts Corn syrup, sugars, honey, and molasses. Candy. Jam and jelly. Syrup. Sweetened cereals. Cookies, pies, cakes, donuts, muffins, and ice cream. The items listed above may not be a complete list   of foods and beverages youshould avoid. Contact a dietitian for more information. Summary Choosing the right foods helps keep your fat and cholesterol at normal levels. This can keep you from getting certain diseases. At meals, fill one-half of your plate with vegetables and green salads. Eat high-fiber foods, like whole grains, beans, apples, carrots, peas, and barley. Limit added sugar, saturated fats, alcohol, and fried foods. This information is not intended to replace advice given to you by your health care provider. Make sure you discuss any questions you have with your healthcare provider. Document Revised: 10/12/2019 Document Reviewed: 10/12/2019 Elsevier Patient Education  2022 Elsevier Inc.  

## 2021-03-05 ENCOUNTER — Ambulatory Visit: Payer: BC Managed Care – PPO | Admitting: Nurse Practitioner

## 2021-03-07 ENCOUNTER — Other Ambulatory Visit: Payer: Self-pay

## 2021-03-07 ENCOUNTER — Ambulatory Visit (INDEPENDENT_AMBULATORY_CARE_PROVIDER_SITE_OTHER): Payer: BC Managed Care – PPO | Admitting: Nurse Practitioner

## 2021-03-07 ENCOUNTER — Encounter: Payer: Self-pay | Admitting: Nurse Practitioner

## 2021-03-07 VITALS — BP 99/64 | HR 91 | Temp 99.3°F | Ht 62.5 in | Wt 181.3 lb

## 2021-03-07 DIAGNOSIS — Z6832 Body mass index (BMI) 32.0-32.9, adult: Secondary | ICD-10-CM

## 2021-03-07 DIAGNOSIS — R5383 Other fatigue: Secondary | ICD-10-CM | POA: Diagnosis not present

## 2021-03-07 DIAGNOSIS — Z6834 Body mass index (BMI) 34.0-34.9, adult: Secondary | ICD-10-CM | POA: Diagnosis not present

## 2021-03-07 MED ORDER — PHENTERMINE HCL 15 MG PO CAPS: 15.0000 mg | ORAL_CAPSULE | ORAL | 0 refills | Status: DC

## 2021-03-07 NOTE — Patient Instructions (Signed)

## 2021-03-07 NOTE — Progress Notes (Signed)
Established Patient Office Visit  Subjective:  Patient ID: Allison Fischer, female    DOB: [birth year]  Age: 35 y.o. MRN: [id number]  CC:  Chief Complaint  Patient presents with   Weight Check    HPI Allison Fischer presents for follow-up visit.  She is currently taking phentermine 15 mg tablets daily.  States she is taking this most days but skips some days.  She has lost 7 pounds since her most recent visit.  She continues to be physically active.  Most weeks she is taking 2 part classes and to spin classes.  Some weeks she does take breaks due to fatigue.  She has a physically demanding job.  Has young children at home.  She states she is doing well with current dose of phentermine.  Has no negative side effects from taking this.  She continues to eat a 1200-calorie diet's. She denies new concerns or complaints.  She denies chest pain, chest pressure, or shortness of breath. She denies headaches or visual disturbances. She denies abdominal pain, nausea, vomiting, or changes in bowel or bladder habits.    Past Medical History:  Diagnosis Date   Allergy    Medical history non-contributory     Past Surgical History:  Procedure Laterality Date   APPENDECTOMY N/A    [employer] 09/24/2020   EYE SURGERY Bilateral 12/2007   cornea sx to correct vision   LAPAROSCOPIC APPENDECTOMY N/A 04/21/2017   Procedure: APPENDECTOMY LAPAROSCOPIC;  Surgeon: Kinsinger, Arta Bruce, MD;  Location: [facility];  Service: General;  Laterality: N/A;   LAPAROSCOPIC TUBAL LIGATION Bilateral 03/23/2020   Procedure: LAPAROSCOPIC TUBAL LIGATION with filshie clips;  Surgeon: Linda Hedges, DO;  Location: [facility];  Service: Gynecology;  Laterality: Bilateral;   TUBAL LIGATION N/A    [employer] 09/24/2020    Family History  Problem Relation Age of Onset   High Cholesterol Mother    High blood pressure Father    Hypertension Maternal Grandmother    Cancer Maternal Grandmother    Heart attack Maternal  Grandfather    High blood pressure Maternal Grandfather    High blood pressure Paternal Grandmother    Heart attack Paternal Grandfather    High blood pressure Paternal Grandfather     Social History   Socioeconomic History   Marital status: Married    Spouse name: Not on file   Number of children: Not on file   Years of education: Not on file   Highest education level: Not on file  Occupational History   Not on file  Tobacco Use   Smoking status: Never   Smokeless tobacco: Never  Vaping Use   Vaping Use: Never used  Substance and Sexual Activity   Alcohol use: Yes    Alcohol/week: 0.0 standard drinks    Comment: occ weekends   Drug use: No   Sexual activity: Yes  Other Topics Concern   Not on file  Social History Narrative   Not on file   Social Determinants of Health   Financial Resource Strain: Not on file  Food Insecurity: Not on file  Transportation Needs: Not on file  Physical Activity: Not on file  Stress: Not on file  Social Connections: Not on file  Intimate Partner Violence: Not on file    Outpatient Medications Prior to Visit  Medication Sig Dispense Refill   phentermine 15 MG capsule Take 1 capsule (15 mg total) by mouth every morning. 30 capsule 0   cetirizine (ZYRTEC ALLERGY) 10 MG  tablet Take 1 tablet (10 mg total) by mouth daily. 30 tablet 0   fluticasone (FLONASE) 50 MCG/ACT nasal spray Place 2 sprays into both nostrils daily. (Patient not taking: Reported on 01/31/2021) 16 g 12   pseudoephedrine (SUDAFED) 60 MG tablet Take 1 tablet (60 mg total) by mouth every 8 (eight) hours as needed for congestion. 30 tablet 0   No facility-administered medications prior to visit.    Allergies  Allergen Reactions   Penicillins Rash    Has patient had a PCN reaction causing immediate rash, facial/tongue/throat swelling, SOB or lightheadedness with hypotension: Yes Has patient had a PCN reaction causing severe rash involving mucus membranes or skin necrosis:  No Has patient had a PCN reaction that required hospitalization No Has patient had a PCN reaction occurring within the last 10 years: No If all of the above answers are "NO", then may proceed with Cephalosporin use.    Tape Rash    Adnesive= itching and bumps    ROS Review of Systems  Constitutional:  Negative for activity change, appetite change, chills, fatigue and fever.       Seven pound weight loss since her last visit.   HENT:  Negative for congestion, postnasal drip, rhinorrhea, sinus pressure, sinus pain, sneezing and sore throat.   Eyes: Negative.   Respiratory:  Negative for cough, chest tightness, shortness of breath and wheezing.   Cardiovascular:  Negative for chest pain and palpitations.  Gastrointestinal:  Negative for abdominal pain, constipation, diarrhea, nausea and vomiting.  Endocrine: Negative for cold intolerance, heat intolerance, polydipsia and polyuria.  Genitourinary:  Negative for dyspareunia, dysuria, flank pain, frequency and urgency.  Musculoskeletal:  Negative for arthralgias, back pain and myalgias.  Skin:  Negative for rash.  Allergic/Immunologic: Negative for environmental allergies.  Neurological:  Negative for dizziness, weakness and headaches.  Hematological:  Negative for adenopathy.  Psychiatric/Behavioral:  The patient is not nervous/anxious.      Objective:    Physical Exam Vitals and nursing note reviewed.  Constitutional:      Appearance: Normal appearance. She is well-developed. She is obese.  HENT:     Head: Normocephalic and atraumatic.     Nose: Nose normal.     Mouth/Throat:     Mouth: Mucous membranes are moist.  Eyes:     Extraocular Movements: Extraocular movements intact.     Conjunctiva/sclera: Conjunctivae normal.     Pupils: Pupils are equal, round, and reactive to light.  Cardiovascular:     Rate and Rhythm: Normal rate and regular rhythm.     Pulses: Normal pulses.     Heart sounds: Normal heart sounds.   Pulmonary:     Effort: Pulmonary effort is normal.     Breath sounds: Normal breath sounds.  Abdominal:     Palpations: Abdomen is soft.  Musculoskeletal:        General: Normal range of motion.     Cervical back: Normal range of motion and neck supple.  Lymphadenopathy:     Cervical: No cervical adenopathy.  Skin:    General: Skin is warm and dry.     Capillary Refill: Capillary refill takes less than 2 seconds.  Neurological:     General: No focal deficit present.     Mental Status: She is alert and oriented to person, place, and time.  Psychiatric:        Mood and Affect: Mood normal.        Behavior: Behavior normal.  Thought Content: Thought content normal.        Judgment: Judgment normal.    Today's Vitals   03/07/21 1543  BP: 99/64  Pulse: 91  Temp: 99.3 F (37.4 C)  SpO2: 100%  Weight: 181 lb 4.8 oz (82.2 kg)  Height: 5' 2.5" (1.588 m)   Body mass index is 32.63 kg/m.   Wt Readings from Last 3 Encounters:  03/07/21 181 lb 4.8 oz (82.2 kg)  01/31/21 188 lb 14.4 oz (85.7 kg)  01/02/21 192 lb 8 oz (87.3 kg)     Health Maintenance Due  Topic Date Due   Hepatitis C Screening  Never done   INFLUENZA VACCINE  01/21/2021    There are no preventive care reminders to display for this patient.  Lab Results  Component Value Date   TSH 0.426 (L) 11/01/2020   Lab Results  Component Value Date   WBC 5.6 11/01/2020   HGB 13.2 11/01/2020   HCT 40.0 11/01/2020   MCV 87 11/01/2020   PLT 296 11/01/2020   Lab Results  Component Value Date   NA 137 11/01/2020   K 5.0 11/01/2020   CO2 23 11/01/2020   GLUCOSE 96 11/01/2020   BUN 12 11/01/2020   CREATININE 0.84 11/01/2020   BILITOT 0.7 11/01/2020   ALKPHOS 62 11/01/2020   AST 11 11/01/2020   ALT 9 11/01/2020   PROT 6.1 11/01/2020   ALBUMIN 3.9 11/01/2020   CALCIUM 9.3 11/01/2020   ANIONGAP 11 09/28/2017   EGFR 94 11/01/2020   Lab Results  Component Value Date   CHOL 173 11/01/2020   Lab  Results  Component Value Date   HDL 59 11/01/2020   Lab Results  Component Value Date   LDLCALC 102 (H) 11/01/2020   Lab Results  Component Value Date   TRIG 62 11/01/2020   Lab Results  Component Value Date   CHOLHDL 2.9 11/01/2020   Lab Results  Component Value Date   HGBA1C 5.4 11/01/2020      Assessment & Plan:  1. Other fatigue Improving fatigue.  2. Body mass index (BMI) of 32.0-32.9 in adult Improving.  Weight loss of 7 pounds since last visit.  No negative side effects from continued.  We will continue.  Recommend 1200-calorie to 1500-calorie diet daily.  Continue with incorporating physical activity into daily routine.  Reassess at next visit in approximately 30 days.   Problem List Items Addressed This Visit       Other   Body mass index (BMI) of 32.0-32.9 in adult   Relevant Medications   phentermine 15 MG capsule   Other fatigue - Primary    Meds ordered this encounter  Medications   phentermine 15 MG capsule    Sig: Take 1 capsule (15 mg total) by mouth every morning.    Dispense:  30 capsule    Refill:  0    Order Specific Question:   Supervising Provider    Answer:   Beatrice Lecher D [[id number]   This note was dictated using Dragon Voice Recognition Software. Rapid proofreading was performed to expedite the delivery of the information. Despite proofreading, phonetic errors will occur which are common with this voice recognition software. Please take this into consideration. If there are any concerns, please contact our office.    Follow-up: Return in 4 weeks (on 04/04/2021) for routine - weight management - hek TSH, Free T4, and T4 at time of viist .    Ronnell Freshwater, NP

## 2021-03-17 DIAGNOSIS — R5383 Other fatigue: Secondary | ICD-10-CM | POA: Insufficient documentation

## 2021-04-04 ENCOUNTER — Ambulatory Visit (INDEPENDENT_AMBULATORY_CARE_PROVIDER_SITE_OTHER): Payer: BC Managed Care – PPO | Admitting: Nurse Practitioner

## 2021-04-04 ENCOUNTER — Other Ambulatory Visit: Payer: Self-pay

## 2021-04-04 ENCOUNTER — Encounter: Payer: Self-pay | Admitting: Nurse Practitioner

## 2021-04-04 VITALS — BP 107/72 | HR 79 | Temp 98.2°F | Ht 62.5 in | Wt 178.2 lb

## 2021-04-04 DIAGNOSIS — Z6832 Body mass index (BMI) 32.0-32.9, adult: Secondary | ICD-10-CM | POA: Diagnosis not present

## 2021-04-04 DIAGNOSIS — R5383 Other fatigue: Secondary | ICD-10-CM

## 2021-04-04 DIAGNOSIS — Z23 Encounter for immunization: Secondary | ICD-10-CM | POA: Diagnosis not present

## 2021-04-04 MED ORDER — PHENTERMINE HCL 15 MG PO CAPS: 15.0000 mg | ORAL_CAPSULE | ORAL | 0 refills | Status: DC

## 2021-04-04 NOTE — Progress Notes (Signed)
Established Patient Office Visit  Subjective:  Patient ID: Allison Fischer, female    DOB: [birth year]  Age: 35 y.o. MRN: [id number]  CC:  Chief Complaint  Patient presents with   Weight Check    HPI Allison Fischer presents for management of weight loss. Has lost three pounds since her most recent visit. Has lost 21 pounds since May, 2022. Currently taking phentermine 15mg  daily.  Denies negative side effects from taking this medication.  She is exercising 4 days a week.  Continues to do pump classes twice a week and spin classes for 2 days.  She eats a low calorie diet which is low in fat and low in cholesterol.  She has no new concerns or complaints today.  She denies chest pain, chest pressure, or shortness of breath. She denies headaches or visual disturbances. She denies abdominal pain, nausea, vomiting, or changes in bowel or bladder habits.   She would like to get her flu shot while she is in the office today.  Past Medical History:  Diagnosis Date   Allergy    Medical history non-contributory     Past Surgical History:  Procedure Laterality Date   APPENDECTOMY N/A    Phreesia 09/24/2020   EYE SURGERY Bilateral 12/2007   cornea sx to correct vision   LAPAROSCOPIC APPENDECTOMY N/A 04/21/2017   Procedure: APPENDECTOMY LAPAROSCOPIC;  Surgeon: Kinsinger, Arta Bruce, MD;  Location: [facility];  Service: General;  Laterality: N/A;   LAPAROSCOPIC TUBAL LIGATION Bilateral 03/23/2020   Procedure: LAPAROSCOPIC TUBAL LIGATION with filshie clips;  Surgeon: Linda Hedges, DO;  Location: [facility];  Service: Gynecology;  Laterality: Bilateral;   TUBAL LIGATION N/A    Phreesia 09/24/2020    Family History  Problem Relation Age of Onset   High Cholesterol Mother    High blood pressure Father    Hypertension Maternal Grandmother    Cancer Maternal Grandmother    Heart attack Maternal Grandfather    High blood pressure Maternal Grandfather    High blood pressure Paternal  Grandmother    Heart attack Paternal Grandfather    High blood pressure Paternal Grandfather     Social History   Socioeconomic History   Marital status: Married    Spouse name: Not on file   Number of children: Not on file   Years of education: Not on file   Highest education level: Not on file  Occupational History   Not on file  Tobacco Use   Smoking status: Never   Smokeless tobacco: Never  Vaping Use   Vaping Use: Never used  Substance and Sexual Activity   Alcohol use: Yes    Alcohol/week: 0.0 standard drinks    Comment: occ weekends   Drug use: No   Sexual activity: Yes  Other Topics Concern   Not on file  Social History Narrative   Not on file   Social Determinants of Health   Financial Resource Strain: Not on file  Food Insecurity: Not on file  Transportation Needs: Not on file  Physical Activity: Not on file  Stress: Not on file  Social Connections: Not on file  Intimate Partner Violence: Not on file    Outpatient Medications Prior to Visit  Medication Sig Dispense Refill   phentermine 15 MG capsule Take 1 capsule (15 mg total) by mouth every morning. 30 capsule 0   No facility-administered medications prior to visit.    Allergies  Allergen Reactions   Penicillins Rash    Has  patient had a PCN reaction causing immediate rash, facial/tongue/throat swelling, SOB or lightheadedness with hypotension: Yes Has patient had a PCN reaction causing severe rash involving mucus membranes or skin necrosis: No Has patient had a PCN reaction that required hospitalization No Has patient had a PCN reaction occurring within the last 10 years: No If all of the above answers are "NO", then may proceed with Cephalosporin use.    Tape Rash    Adnesive= itching and bumps    ROS Review of Systems  Constitutional:  Negative for activity change, appetite change, chills, fatigue and fever.       3 pound weight loss since most recent visit.  HENT:  Negative for  congestion, postnasal drip, rhinorrhea, sinus pressure, sinus pain, sneezing and sore throat.   Eyes: Negative.   Respiratory:  Negative for cough, chest tightness, shortness of breath and wheezing.   Cardiovascular:  Negative for chest pain and palpitations.  Gastrointestinal:  Negative for abdominal pain, constipation, diarrhea, nausea and vomiting.  Endocrine: Negative for cold intolerance, heat intolerance, polydipsia and polyuria.  Genitourinary:  Negative for dyspareunia, dysuria, flank pain, frequency and urgency.  Musculoskeletal:  Negative for arthralgias, back pain and myalgias.  Skin:  Negative for rash.  Allergic/Immunologic: Negative for environmental allergies.  Neurological:  Positive for headaches. Negative for dizziness and weakness.       Patient reporting more frequent headaches.  Does have increased work-related stress.  Believes headaches are related to this increase in stress.  Hematological:  Negative for adenopathy.  Psychiatric/Behavioral:  The patient is not nervous/anxious.      Objective:    Physical Exam Vitals and nursing note reviewed.  Constitutional:      Appearance: Normal appearance. She is well-developed. She is obese.  HENT:     Head: Normocephalic and atraumatic.     Nose: Nose normal.     Mouth/Throat:     Mouth: Mucous membranes are moist.  Eyes:     Extraocular Movements: Extraocular movements intact.     Conjunctiva/sclera: Conjunctivae normal.     Pupils: Pupils are equal, round, and reactive to light.  Cardiovascular:     Rate and Rhythm: Normal rate and regular rhythm.     Pulses: Normal pulses.     Heart sounds: Normal heart sounds.  Pulmonary:     Effort: Pulmonary effort is normal.     Breath sounds: Normal breath sounds.  Abdominal:     Palpations: Abdomen is soft.  Musculoskeletal:        General: Normal range of motion.     Cervical back: Normal range of motion and neck supple.  Lymphadenopathy:     Cervical: No cervical  adenopathy.  Skin:    General: Skin is warm and dry.     Capillary Refill: Capillary refill takes less than 2 seconds.  Neurological:     General: No focal deficit present.     Mental Status: She is alert and oriented to person, place, and time.  Psychiatric:        Mood and Affect: Mood normal.        Behavior: Behavior normal.        Thought Content: Thought content normal.        Judgment: Judgment normal.    Today's Vitals   04/04/21 1608  BP: 107/72  Pulse: 79  Temp: 98.2 F (36.8 C)  SpO2: 99%  Weight: 178 lb 3.2 oz (80.8 kg)  Height: 5' 2.5" (1.588 m)   Body  mass index is 32.07 kg/m.   Wt Readings from Last 3 Encounters:  04/04/21 178 lb 3.2 oz (80.8 kg)  03/07/21 181 lb 4.8 oz (82.2 kg)  01/31/21 188 lb 14.4 oz (85.7 kg)     There are no preventive care reminders to display for this patient.   There are no preventive care reminders to display for this patient.  Lab Results  Component Value Date   TSH 0.426 (L) 11/01/2020   Lab Results  Component Value Date   WBC 5.6 11/01/2020   HGB 13.2 11/01/2020   HCT 40.0 11/01/2020   MCV 87 11/01/2020   PLT 296 11/01/2020   Lab Results  Component Value Date   NA 137 11/01/2020   K 5.0 11/01/2020   CO2 23 11/01/2020   GLUCOSE 96 11/01/2020   BUN 12 11/01/2020   CREATININE 0.84 11/01/2020   BILITOT 0.7 11/01/2020   ALKPHOS 62 11/01/2020   AST 11 11/01/2020   ALT 9 11/01/2020   PROT 6.1 11/01/2020   ALBUMIN 3.9 11/01/2020   CALCIUM 9.3 11/01/2020   ANIONGAP 11 09/28/2017   EGFR 94 11/01/2020   Lab Results  Component Value Date   CHOL 173 11/01/2020   Lab Results  Component Value Date   HDL 59 11/01/2020   Lab Results  Component Value Date   LDLCALC 102 (H) 11/01/2020   Lab Results  Component Value Date   TRIG 62 11/01/2020   Lab Results  Component Value Date   CHOLHDL 2.9 11/01/2020   Lab Results  Component Value Date   HGBA1C 5.4 11/01/2020      Assessment & Plan:  1. Other  fatigue Increased fatigue likely related to work-related stress.  Patient developed of fatigue and headaches.  We will reassess at next visit in 4 weeks.  2. Body mass index 32.0-32.9, adult Improving.  3 pound weight loss since last visit.  We continue phentermine 15 mg daily.  Consume diet less than 1500 cal/day.  Continue with regular exercise programs. - phentermine 15 MG capsule; Take 1 capsule (15 mg total) by mouth every morning.  Dispense: 30 capsule; Refill: 0  3. Need for influenza vaccination Flu vaccine administered during today's visit. - Flu Vaccine QUAD 6+ mos PF IM (Fluarix Quad PF)   Problem List Items Addressed This Visit       Other   Body mass index 32.0-32.9, adult   Relevant Medications   phentermine 15 MG capsule   Other fatigue - Primary   Need for influenza vaccination   Relevant Orders   Flu Vaccine QUAD 6+ mos PF IM (Fluarix Quad PF) (Completed)    Meds ordered this encounter  Medications   phentermine 15 MG capsule    Sig: Take 1 capsule (15 mg total) by mouth every morning.    Dispense:  30 capsule    Refill:  0    Order Specific Question:   Supervising Provider    Answer:   Beatrice Lecher D [[id number]]   This note was dictated using Dragon Voice Recognition Software. Rapid proofreading was performed to expedite the delivery of the information. Despite proofreading, phonetic errors will occur which are common with this voice recognition software. Please take this into consideration. If there are any concerns, please contact our office.    Follow-up: Return in about 4 weeks (around 05/02/2021) for routine - weight management.    Ronnell Freshwater, NP

## 2021-04-04 NOTE — Patient Instructions (Signed)

## 2021-05-01 ENCOUNTER — Ambulatory Visit (INDEPENDENT_AMBULATORY_CARE_PROVIDER_SITE_OTHER): Payer: BC Managed Care – PPO | Admitting: Nurse Practitioner

## 2021-05-01 ENCOUNTER — Encounter: Payer: Self-pay | Admitting: Nurse Practitioner

## 2021-05-01 ENCOUNTER — Ambulatory Visit: Payer: BC Managed Care – PPO | Admitting: Nurse Practitioner

## 2021-05-01 VITALS — Ht 62.5 in | Wt 173.4 lb

## 2021-05-01 DIAGNOSIS — R634 Abnormal weight loss: Secondary | ICD-10-CM | POA: Diagnosis not present

## 2021-05-01 DIAGNOSIS — Z6831 Body mass index (BMI) 31.0-31.9, adult: Secondary | ICD-10-CM

## 2021-05-01 MED ORDER — PHENTERMINE HCL 15 MG PO CAPS: 15.0000 mg | ORAL_CAPSULE | ORAL | 0 refills | Status: DC

## 2021-05-01 NOTE — Progress Notes (Addendum)
Virtual Visit via Telephone Note  I connected with Allison Fischer on 05/21/21 at  1:10 PM EST by telephone and verified that I am speaking with the correct person using two identifiers.  Location: Patient: home Provider: [place] [facility]     I discussed the limitations, risks, security and privacy concerns of performing an evaluation and management service by telephone and the availability of in person appointments. I also discussed with the patient that there may be a patient responsible charge related to this service. The patient expressed understanding and agreed to proceed.   History of Present Illness: The patient presents for routine follow up. She currently takes phentermine 15mg  daily. She has lost 5 pounds since her last visit. She has lost a little more that 30 pounds since starting on this medication. She continues to follow a low calorie diet. She participates in spin class twice weekly and pump classes twice weekly.  She has no negative side effects from taking phentermine.  Would like to continue.  Her goal weight is 145.   Observations/Objective:  The patient is alert and oriented. She is pleasant and answers all questions appropriately. Breathing is non-labored. She is in no acute distress at this time.    Today's Vitals   05/01/21 1146  Weight: 173 lb 6.4 oz (78.7 kg)  Height: 5' 2.5" (1.588 m)   Body mass index is 31.21 kg/m.   Assessment and Plan:  1. Weight loss Improving with an additional 5 pound weight loss since her most recent visit.  Has lost more than 30 pounds since earlier this year.  We will continue this for an additional 30 days. Discussed importance of continuing a 1500 calories per day and incorporating exercise into daily routine to help lose weight. Will monitor.  2. Body mass index (BMI) of 31.0-31.9 in adult Discussed lowering calorie intake to 1500 calories per day and incorporating exercise into daily routine to help lose  weight.  - phentermine 15 MG capsule; Take 1 capsule (15 mg total) by mouth every morning.  Dispense: 30 capsule; Refill: 0   Follow Up Instructions:    I discussed the assessment and treatment plan with the patient. The patient was provided an opportunity to ask questions and all were answered. The patient agreed with the plan and demonstrated an understanding of the instructions.   The patient was advised to call back or seek an in-person evaluation if the symptoms worsen or if the condition fails to improve as anticipated.  I provided 10 minutes of non-face-to-face time during this encounter.   03-08-1968, NP

## 2021-05-01 NOTE — Patient Instructions (Signed)
Fat and Cholesterol Restricted Eating Plan Getting too much fat and cholesterol in your diet may cause health problems. Choosing the right foods helps keep your fat and cholesterol at normal levels. This can keep you from getting certain diseases. Your doctor may recommend an eating plan that includes: Total fat: ______% or less of total calories a day. This is ______g of fat a day. Saturated fat: ______% or less of total calories a day. This is ______g of saturated fat a day. Cholesterol: less than _________mg a day. Fiber: ______g a day. What are tips for following this plan? General tips Work with your doctor to lose weight if you need to. Avoid: Foods with added sugar. Fried foods. Foods with trans fat or partially hydrogenated oils. This includes some margarines and baked goods. If you drink alcohol: Limit how much you have to: 0-1 drink a day for women who are not pregnant. 0-2 drinks a day for men. Know how much alcohol is in a drink. In the [place] one drink equals one 12 oz bottle of beer (355 mL), one 5 oz glass of wine (148 mL), or one 1 oz glass of hard liquor (44 mL). Reading food labels Check food labels for: Trans fats. Partially hydrogenated oils. Saturated fat (g) in each serving. Cholesterol (mg) in each serving. Fiber (g) in each serving. Choose foods with healthy fats, such as: Monounsaturated fats and polyunsaturated fats. These include olive and canola oil, flaxseeds, walnuts, almonds, and seeds. Omega-3 fats. These are found in certain fish, flaxseed oil, and ground flaxseeds. Choose grain products that have whole grains. Look for the word "whole" as the first word in the ingredient list. Cooking Cook foods using low-fat methods. These include baking, boiling, grilling, and broiling. Eat more home-cooked foods. Eat at restaurants and buffets less often. Eat less fast food. Avoid cooking using saturated fats, such as butter, cream, palm oil, palm kernel oil, and  coconut oil. Meal planning  At meals, divide your plate into four equal parts: Fill one-half of your plate with vegetables, green salads, and fruit. Fill one-fourth of your plate with whole grains. Fill one-fourth of your plate with low-fat (lean) protein foods. Eat fish that is high in omega-3 fats at least two times a week. This includes mackerel, tuna, sardines, and salmon. Eat foods that are high in fiber, such as whole grains, beans, apples, pears, berries, broccoli, carrots, peas, and barley. What foods should I eat? Fruits All fresh, canned (in natural juice), or frozen fruits. Vegetables Fresh or frozen vegetables (raw, steamed, roasted, or grilled). Green salads. Grains Whole grains, such as whole wheat or whole grain breads, crackers, cereals, and pasta. Unsweetened oatmeal, bulgur, barley, quinoa, or brown rice. Corn or whole wheat flour tortillas. Meats and other protein foods Ground beef (85% or leaner), grass-fed beef, or beef trimmed of fat. Skinless chicken or turkey. Ground chicken or turkey. Pork trimmed of fat. All fish and seafood. Egg whites. Dried beans, peas, or lentils. Unsalted nuts or seeds. Unsalted canned beans. Nut butters without added sugar or oil. Dairy Low-fat or nonfat dairy products, such as skim or 1% milk, 2% or reduced-fat cheeses, low-fat and fat-free ricotta or cottage cheese, or plain low-fat and nonfat yogurt. Fats and oils Tub margarine without trans fats. Light or reduced-fat mayonnaise and salad dressings. Avocado. Olive, canola, sesame, or safflower oils. The items listed above may not be a complete list of foods and beverages you can eat. Contact a dietitian for more information. What foods   should I avoid? Fruits Canned fruit in heavy syrup. Fruit in cream or butter sauce. Fried fruit. Vegetables Vegetables cooked in cheese, cream, or butter sauce. Fried vegetables. Grains White bread. White pasta. White rice. Cornbread. Bagels, pastries,  and croissants. Crackers and snack foods that contain trans fat and hydrogenated oils. Meats and other protein foods Fatty cuts of meat. Ribs, chicken wings, bacon, sausage, bologna, salami, chitterlings, fatback, hot dogs, bratwurst, and packaged lunch meats. Liver and organ meats. Whole eggs and egg yolks. Chicken and turkey with skin. Fried meat. Dairy Whole or 2% milk, cream, half-and-half, and cream cheese. Whole milk cheeses. Whole-fat or sweetened yogurt. Full-fat cheeses. Nondairy creamers and whipped toppings. Processed cheese, cheese spreads, and cheese curds. Fats and oils Butter, stick margarine, lard, shortening, ghee, or bacon fat. Coconut, palm kernel, and palm oils. Beverages Alcohol. Sugar-sweetened drinks such as sodas, lemonade, and fruit drinks. Sweets and desserts Corn syrup, sugars, honey, and molasses. Candy. Jam and jelly. Syrup. Sweetened cereals. Cookies, pies, cakes, donuts, muffins, and ice cream. The items listed above may not be a complete list of foods and beverages you should avoid. Contact a dietitian for more information. Summary Choosing the right foods helps keep your fat and cholesterol at normal levels. This can keep you from getting certain diseases. At meals, fill one-half of your plate with vegetables, green salads, and fruits. Eat high fiber foods, like whole grains, beans, apples, pears, berries, carrots, peas, and barley. Limit added sugar, saturated fats, alcohol, and fried foods. This information is not intended to replace advice given to you by your health care provider. Make sure you discuss any questions you have with your health care provider. Document Revised: 10/19/2020 Document Reviewed: 10/19/2020 Elsevier Patient Education  2022 [employer]vier Inc.  

## 2021-05-21 DIAGNOSIS — R634 Abnormal weight loss: Secondary | ICD-10-CM | POA: Insufficient documentation

## 2021-06-18 ENCOUNTER — Encounter: Payer: Self-pay | Admitting: Nurse Practitioner

## 2021-06-18 ENCOUNTER — Other Ambulatory Visit: Payer: Self-pay

## 2021-06-18 ENCOUNTER — Ambulatory Visit (INDEPENDENT_AMBULATORY_CARE_PROVIDER_SITE_OTHER): Payer: BC Managed Care – PPO | Admitting: Nurse Practitioner

## 2021-06-18 VITALS — Ht 62.5 in | Wt 167.5 lb

## 2021-06-18 DIAGNOSIS — Z6831 Body mass index (BMI) 31.0-31.9, adult: Secondary | ICD-10-CM

## 2021-06-18 DIAGNOSIS — R634 Abnormal weight loss: Secondary | ICD-10-CM | POA: Diagnosis not present

## 2021-06-18 MED ORDER — PHENTERMINE HCL 15 MG PO CAPS: 15.0000 mg | ORAL_CAPSULE | ORAL | 0 refills | Status: DC

## 2021-06-18 NOTE — Progress Notes (Signed)
Virtual Visit via Telephone Note  I connected with Allison Fischer on 06/24/21 at  3:30 PM EST by telephone and verified that I am speaking with the correct person using two identifiers.  Location: Patient: home  Provider: [place] [facility] at [facility]     I discussed the limitations, risks, security and privacy concerns of performing an evaluation and management service by telephone and the availability of in person appointments. I also discussed with the patient that there may be a patient responsible charge related to this service. The patient expressed understanding and agreed to proceed.   History of Present Illness: The patient presents for follow up visit. She is currently taking phentermine to help her with weight management. She continues to consume a healthy and low-calorie diet. She takes spin classes twice weekly and pump classes on two other days. She has lost six pounds since her mot recent viit. She has lost 38 pounds since starting on this program to help with weight management. She plans to continue with this program. She has a goal to get to a weight of 145 pounds. She plans to travel to [place] in the next two months to have "tummy tuck" procedure. She has no negative side effects to reprot from taking phentermine.    Observations/Objective:  The patient is alert and oriented. She is pleasant and answers all questions appropriately. Breathing is non-labored. She is in no acute distress at this time.    Today's Vitals   06/18/21 1501  Weight: 167 lb 8 oz (76 kg)  Height: 5' 2.5" (1.588 m)   Body mass index is 30.15 kg/m.   Assessment and Plan: 1. Weight loss Patient doing well with current weight loss program. Will continue as prescribed.   2. Body mass index (BMI) of 31.0-31.9 in adult Will continue phentermine 15mg  capsules daily. Limit calorie intake to 1500 calories per day and continue with regular exercise. Will follow up in one month for surveillance.   - phentermine 15 MG capsule; Take 1 capsule (15 mg total) by mouth every morning.  Dispense: 30 capsule; Refill: 0   Follow Up Instructions:    I discussed the assessment and treatment plan with the patient. The patient was provided an opportunity to ask questions and all were answered. The patient agreed with the plan and demonstrated an understanding of the instructions.   The patient was advised to call back or seek an in-person evaluation if the symptoms worsen or if the condition fails to improve as anticipated.  I provided 10 minutes of non-face-to-face time during this encounter.   , NP

## 2021-07-18 ENCOUNTER — Encounter: Payer: Self-pay | Admitting: Nurse Practitioner

## 2021-07-18 ENCOUNTER — Ambulatory Visit (INDEPENDENT_AMBULATORY_CARE_PROVIDER_SITE_OTHER): Payer: BC Managed Care – PPO | Admitting: Nurse Practitioner

## 2021-07-18 ENCOUNTER — Other Ambulatory Visit: Payer: Self-pay

## 2021-07-18 VITALS — BP 111/68 | HR 91 | Temp 98.2°F | Ht 62.5 in | Wt 168.9 lb

## 2021-07-18 DIAGNOSIS — R634 Abnormal weight loss: Secondary | ICD-10-CM | POA: Diagnosis not present

## 2021-07-18 DIAGNOSIS — Z6830 Body mass index (BMI) 30.0-30.9, adult: Secondary | ICD-10-CM

## 2021-07-18 MED ORDER — PHENTERMINE HCL 15 MG PO CAPS: 15.0000 mg | ORAL_CAPSULE | ORAL | 1 refills | Status: DC

## 2021-07-18 NOTE — Assessment & Plan Note (Signed)
conitnue phentermine until one week prior to surgery. May restart as ordered per surgeon. Will follow up with patient in 2 months.

## 2021-07-18 NOTE — Progress Notes (Signed)
Established patient visit   Patient: Allison Fischer   DOB: [birth year]   36 y.o. Female  MRN: [id number] Visit Date: 07/18/2021   Chief Complaint  Patient presents with   Medication Refill   Subjective    The patient is here for follow up of weight management. She has been taking phentermine since May of 2022. She has lost a total of 32 pounds. She has had a 1 pound weight gain since her last visit. She is going to [place] on 07/25/2021 to have "tummy tuck" surgery. She would like to continue with phentermine until a week prior to surgery. She will be able to restart this medication a few weeks after the surgery. She plans to stay in [place] for a month.   Medication Refill Pertinent negatives include no abdominal pain, arthralgias, chest pain, chills, congestion, coughing, fatigue, fever, headaches, myalgias, nausea, rash, sore throat, vomiting or weakness.      Medications: Outpatient Medications Prior to Visit  Medication Sig   fluticasone (FLONASE) 50 MCG/ACT nasal spray Place 2 sprays into both nostrils daily.   [DISCONTINUED] phentermine 15 MG capsule Take 1 capsule (15 mg total) by mouth every morning.   No facility-administered medications prior to visit.    Review of Systems  Constitutional:  Negative for activity change, appetite change, chills, fatigue and fever.       The patient has had one pound weight gain since most recent visit. Has lost 32 pounds since starting medically managed weight loss.   HENT:  Negative for congestion, postnasal drip, rhinorrhea, sinus pressure, sinus pain, sneezing and sore throat.   Eyes: Negative.   Respiratory:  Negative for cough, chest tightness, shortness of breath and wheezing.   Cardiovascular:  Negative for chest pain and palpitations.  Gastrointestinal:  Negative for abdominal pain, constipation, diarrhea, nausea and vomiting.  Endocrine: Negative for cold intolerance, heat intolerance, polydipsia and polyuria.  Genitourinary:   Negative for dyspareunia, dysuria, flank pain, frequency and urgency.  Musculoskeletal:  Negative for arthralgias, back pain and myalgias.  Skin:  Negative for rash.  Allergic/Immunologic: Negative for environmental allergies.  Neurological:  Negative for dizziness, weakness and headaches.  Hematological:  Negative for adenopathy.  Psychiatric/Behavioral:  The patient is not nervous/anxious.       Objective     Today's Vitals   07/18/21 0833  BP: 111/68  Pulse: 91  Temp: 98.2 F (36.8 C)  SpO2: 98%  Weight: 168 lb 14.4 oz (76.6 kg)  Height: 5' 2.5" (1.588 m)   Body mass index is 30.4 kg/m.   BP Readings from Last 3 Encounters:  07/18/21 111/68  04/04/21 107/72  03/07/21 99/64    Wt Readings from Last 3 Encounters:  07/18/21 168 lb 14.4 oz (76.6 kg)  06/18/21 167 lb 8 oz (76 kg)  05/01/21 173 lb 6.4 oz (78.7 kg)    Physical Exam Vitals and nursing note reviewed.  Constitutional:      Appearance: Normal appearance. She is well-developed.  HENT:     Head: Normocephalic and atraumatic.  Eyes:     Pupils: Pupils are equal, round, and reactive to light.  Cardiovascular:     Rate and Rhythm: Normal rate and regular rhythm.     Pulses: Normal pulses.     Heart sounds: Normal heart sounds.  Pulmonary:     Effort: Pulmonary effort is normal.     Breath sounds: Normal breath sounds.  Abdominal:     Palpations: Abdomen is soft.  Musculoskeletal:  General: Normal range of motion.     Cervical back: Normal range of motion and neck supple.  Lymphadenopathy:     Cervical: No cervical adenopathy.  Skin:    General: Skin is warm and dry.     Capillary Refill: Capillary refill takes less than 2 seconds.  Neurological:     General: No focal deficit present.     Mental Status: She is alert and oriented to person, place, and time.  Psychiatric:        Mood and Affect: Mood normal.        Behavior: Behavior normal.        Thought Content: Thought content normal.         Judgment: Judgment normal.      Assessment & Plan     Problem List Items Addressed This Visit       Other   Body mass index (BMI) of 31.0-31.9 in adult    conitnue phentermine until one week prior to surgery. May restart as ordered per surgeon. Will follow up with patient in 2 months.       Relevant Medications   phentermine 15 MG capsule   Weight loss - Primary     Return in about 2 months (around 09/15/2021) for routine - weight management.         Carlean Jews, NP  [facility] [phone] (phone) [phone] (fax)  [facility]

## 2021-07-18 NOTE — Patient Instructions (Signed)
Fat and Cholesterol Restricted Eating Plan Getting too much fat and cholesterol in your diet may cause health problems. Choosing the right foods helps keep your fat and cholesterol at normal levels. This can keep you from getting certain diseases. Your doctor may recommend an eating plan that includes: Total fat: ______% or less of total calories a day. This is ______g of fat a day. Saturated fat: ______% or less of total calories a day. This is ______g of saturated fat a day. Cholesterol: less than _________mg a day. Fiber: ______g a day. What are tips for following this plan? General tips Work with your doctor to lose weight if you need to. Avoid: Foods with added sugar. Fried foods. Foods with trans fat or partially hydrogenated oils. This includes some margarines and baked goods. If you drink alcohol: Limit how much you have to: 0-1 drink a day for women who are not pregnant. 0-2 drinks a day for men. Know how much alcohol is in a drink. In the [place] one drink equals one 12 oz bottle of beer (355 mL), one 5 oz glass of wine (148 mL), or one 1 oz glass of hard liquor (44 mL). Reading food labels Check food labels for: Trans fats. Partially hydrogenated oils. Saturated fat (g) in each serving. Cholesterol (mg) in each serving. Fiber (g) in each serving. Choose foods with healthy fats, such as: Monounsaturated fats and polyunsaturated fats. These include olive and canola oil, flaxseeds, walnuts, almonds, and seeds. Omega-3 fats. These are found in certain fish, flaxseed oil, and ground flaxseeds. Choose grain products that have whole grains. Look for the word "whole" as the first word in the ingredient list. Cooking Cook foods using low-fat methods. These include baking, boiling, grilling, and broiling. Eat more home-cooked foods. Eat at restaurants and buffets less often. Eat less fast food. Avoid cooking using saturated fats, such as butter, cream, palm oil, palm kernel oil, and  coconut oil. Meal planning  At meals, divide your plate into four equal parts: Fill one-half of your plate with vegetables, green salads, and fruit. Fill one-fourth of your plate with whole grains. Fill one-fourth of your plate with low-fat (lean) protein foods. Eat fish that is high in omega-3 fats at least two times a week. This includes mackerel, tuna, sardines, and salmon. Eat foods that are high in fiber, such as whole grains, beans, apples, pears, berries, broccoli, carrots, peas, and barley. What foods should I eat? Fruits All fresh, canned (in natural juice), or frozen fruits. Vegetables Fresh or frozen vegetables (raw, steamed, roasted, or grilled). Green salads. Grains Whole grains, such as whole wheat or whole grain breads, crackers, cereals, and pasta. Unsweetened oatmeal, bulgur, barley, quinoa, or brown rice. Corn or whole wheat flour tortillas. Meats and other protein foods Ground beef (85% or leaner), grass-fed beef, or beef trimmed of fat. Skinless chicken or turkey. Ground chicken or turkey. Pork trimmed of fat. All fish and seafood. Egg whites. Dried beans, peas, or lentils. Unsalted nuts or seeds. Unsalted canned beans. Nut butters without added sugar or oil. Dairy Low-fat or nonfat dairy products, such as skim or 1% milk, 2% or reduced-fat cheeses, low-fat and fat-free ricotta or cottage cheese, or plain low-fat and nonfat yogurt. Fats and oils Tub margarine without trans fats. Light or reduced-fat mayonnaise and salad dressings. Avocado. Olive, canola, sesame, or safflower oils. The items listed above may not be a complete list of foods and beverages you can eat. Contact a dietitian for more information. What foods   should I avoid? Fruits Canned fruit in heavy syrup. Fruit in cream or butter sauce. Fried fruit. Vegetables Vegetables cooked in cheese, cream, or butter sauce. Fried vegetables. Grains White bread. White pasta. White rice. Cornbread. Bagels, pastries,  and croissants. Crackers and snack foods that contain trans fat and hydrogenated oils. Meats and other protein foods Fatty cuts of meat. Ribs, chicken wings, bacon, sausage, bologna, salami, chitterlings, fatback, hot dogs, bratwurst, and packaged lunch meats. Liver and organ meats. Whole eggs and egg yolks. Chicken and turkey with skin. Fried meat. Dairy Whole or 2% milk, cream, half-and-half, and cream cheese. Whole milk cheeses. Whole-fat or sweetened yogurt. Full-fat cheeses. Nondairy creamers and whipped toppings. Processed cheese, cheese spreads, and cheese curds. Fats and oils Butter, stick margarine, lard, shortening, ghee, or bacon fat. Coconut, palm kernel, and palm oils. Beverages Alcohol. Sugar-sweetened drinks such as sodas, lemonade, and fruit drinks. Sweets and desserts Corn syrup, sugars, honey, and molasses. Candy. Jam and jelly. Syrup. Sweetened cereals. Cookies, pies, cakes, donuts, muffins, and ice cream. The items listed above may not be a complete list of foods and beverages you should avoid. Contact a dietitian for more information. Summary Choosing the right foods helps keep your fat and cholesterol at normal levels. This can keep you from getting certain diseases. At meals, fill one-half of your plate with vegetables, green salads, and fruits. Eat high fiber foods, like whole grains, beans, apples, pears, berries, carrots, peas, and barley. Limit added sugar, saturated fats, alcohol, and fried foods. This information is not intended to replace advice given to you by your health care provider. Make sure you discuss any questions you have with your health care provider. Document Revised: 10/19/2020 Document Reviewed: 10/19/2020 Elsevier Patient Education  2022 [employer]vier Inc.  

## 2021-08-28 ENCOUNTER — Ambulatory Visit: Payer: BC Managed Care – PPO | Admitting: Nurse Practitioner

## 2021-08-29 ENCOUNTER — Other Ambulatory Visit: Payer: Self-pay

## 2021-08-29 ENCOUNTER — Emergency Department (HOSPITAL_BASED_OUTPATIENT_CLINIC_OR_DEPARTMENT_OTHER): Payer: BC Managed Care – PPO

## 2021-08-29 ENCOUNTER — Ambulatory Visit
Admission: EM | Admit: 2021-08-29 | Discharge: 2021-08-29 | Disposition: A | Discharge status: Home / Self Care | Payer: BC Managed Care – PPO

## 2021-08-29 ENCOUNTER — Emergency Department (HOSPITAL_BASED_OUTPATIENT_CLINIC_OR_DEPARTMENT_OTHER)
Admission: EM | Admit: 2021-08-29 | Discharge: 2021-08-29 | Disposition: A | Discharge status: Home / Self Care | Payer: BC Managed Care – PPO | Attending: Emergency Medicine | Admitting: Emergency Medicine

## 2021-08-29 ENCOUNTER — Encounter: Payer: Self-pay | Admitting: Emergency Medicine

## 2021-08-29 ENCOUNTER — Encounter (HOSPITAL_BASED_OUTPATIENT_CLINIC_OR_DEPARTMENT_OTHER): Payer: Self-pay

## 2021-08-29 DIAGNOSIS — R791 Abnormal coagulation profile: Secondary | ICD-10-CM | POA: Diagnosis not present

## 2021-08-29 DIAGNOSIS — R000 Tachycardia, unspecified: Secondary | ICD-10-CM

## 2021-08-29 DIAGNOSIS — R0602 Shortness of breath: Secondary | ICD-10-CM | POA: Insufficient documentation

## 2021-08-29 DIAGNOSIS — Z5189 Encounter for other specified aftercare: Secondary | ICD-10-CM

## 2021-08-29 DIAGNOSIS — Z4800 Encounter for change or removal of nonsurgical wound dressing: Secondary | ICD-10-CM | POA: Diagnosis not present

## 2021-08-29 DIAGNOSIS — T8149XA Infection following a procedure, other surgical site, initial encounter: Secondary | ICD-10-CM

## 2021-08-29 DIAGNOSIS — R072 Precordial pain: Secondary | ICD-10-CM | POA: Diagnosis not present

## 2021-08-29 DIAGNOSIS — R0789 Other chest pain: Secondary | ICD-10-CM

## 2021-08-29 LAB — BASIC METABOLIC PANEL
Anion gap: 8 (ref 5–15)
BUN: 14 mg/dL (ref 6–20)
CO2: 27 mmol/L (ref 22–32)
Calcium: 9.1 mg/dL (ref 8.9–10.3)
Chloride: 103 mmol/L (ref 98–111)
Creatinine, Ser: 0.69 mg/dL (ref 0.44–1.00)
GFR, Estimated: >60 mL/min (ref >60)
Glucose, Bld: 80 mg/dL (ref 70–99)
Potassium: 4.2 mmol/L (ref 3.5–5.1)
Sodium: 138 mmol/L (ref 135–145)

## 2021-08-29 LAB — CBC WITH DIFFERENTIAL/PLATELET
Abs Immature Granulocytes: 0.03 10*3/uL (ref 0.00–0.07)
Basophils Absolute: 0 10*3/uL (ref 0.0–0.1)
Basophils Relative: 1 %
Eosinophils Absolute: 0.1 10*3/uL (ref 0.0–0.5)
Eosinophils Relative: 2 %
HCT: 37.9 % (ref 36.0–46.0)
Hemoglobin: 12.3 g/dL (ref 12.0–15.0)
Immature Granulocytes: 1 %
Lymphocytes Relative: 25 %
Lymphs Abs: 1.6 10*3/uL (ref 0.7–4.0)
MCH: 29 pg (ref 26.0–34.0)
MCHC: 32.5 g/dL (ref 30.0–36.0)
MCV: 89.4 fL (ref 80.0–100.0)
Monocytes Absolute: 0.4 10*3/uL (ref 0.1–1.0)
Monocytes Relative: 6 %
Neutro Abs: 4.2 10*3/uL (ref 1.7–7.7)
Neutrophils Relative %: 65 %
Platelets: 419 10*3/uL — ABNORMAL HIGH (ref 150–400)
RBC: 4.24 MIL/uL (ref 3.87–5.11)
RDW: 13.4 % (ref 11.5–15.5)
WBC: 6.4 10*3/uL (ref 4.0–10.5)
nRBC: 0 % (ref 0.0–0.2)

## 2021-08-29 LAB — PREGNANCY, URINE: Preg Test, Ur: NEGATIVE

## 2021-08-29 LAB — TROPONIN I (HIGH SENSITIVITY): Troponin I (High Sensitivity): <2 ng/L (ref <18)

## 2021-08-29 LAB — D-DIMER, QUANTITATIVE: D-Dimer, Quant: 1.24 ug/mL-FEU — ABNORMAL HIGH (ref 0.00–0.50)

## 2021-08-29 MED ORDER — IOHEXOL 350 MG/ML SOLN
100.0000 mL | Freq: Once | INTRAVENOUS | Status: AC | PRN
  Administered 2021-08-29: 12:00:00 100 mL via INTRAVENOUS

## 2021-08-29 NOTE — ED Triage Notes (Addendum)
Had plastic surgery on 2/7 that involved breast implants, vaginoplasty, liposuction from arms/back/leg/chin/cheeks, abdominopasty, and lipotransfusion to glutes - all performed in one surgery. Surgery performed in [place], [place]. States during surgery her hemoglobin went from 16 to 6, had to be admitted with multiple blood transfusions. States she's felt short of breath and tachycardic since the surgery, worse with sitting and standing. Had appointment with PCP this morning and missed it by a few minutes. Returned to [place] on 3/1. Reports her HR has varied anywhere from 100-145 when she checks it, but that it has stayed elevated. Brought an EKG with her that was performed on 2/28 when they were working her up for her symptoms. Her incision sites on breast and lower abdomin are still open, draining yellow drainage, and the one on right breast has black center. ?

## 2021-08-29 NOTE — ED Provider Notes (Signed)
[facility] EMERGENCY DEPARTMENT Provider Note   CSN: [id number] Arrival date & time: 08/29/21  1017     History  Chief Complaint  Patient presents with   Chest Pain   Wound Check    Allison Fischer is a 36 y.o. female.  Is here with chest pain, shortness of breath.  Wound check.  Recently came back from [place] where she had plastic surgery including breast implants.  She states that she has significant blood loss after surgery and needed multiple blood transfusions.  She is here because she still feeling short of breath intermittently.  Wanted to have her hemoglobin checked.  She is not on any blood thinners.  She is completed antibiotics.  She has had some wound dehiscence to surgical sites around the breast but has not had any purulent drainage fevers or chills.    The history is provided by the patient.  Chest Pain Pain location:  Substernal area Pain quality: aching   Pain radiates to:  Does not radiate Pain severity:  Mild Onset quality:  Gradual Duration:  1 week Timing:  Intermittent Progression:  Waxing and waning Chronicity:  New Relieved by:  Nothing Worsened by:  Nothing Ineffective treatments:  None tried Associated symptoms: no abdominal pain, no altered mental status, no anorexia, no anxiety, no back pain, no claudication, no cough, no diaphoresis, no dizziness, no dysphagia, no fatigue, no heartburn, no lower extremity edema, no nausea and no numbness   Risk factors: no high cholesterol and no hypertension       Home Medications Prior to Admission medications   Medication Sig Start Date End Date Taking? Authorizing Provider  fluticasone (FLONASE) 50 MCG/ACT nasal spray Place 2 sprays into both nostrils daily. 04/10/21   [provider]  phentermine 15 MG capsule Take 1 capsule (15 mg total) by mouth every morning. 07/18/21   Carlean Jews, NP      Allergies    Penicillins and Tape    Review of Systems   Review of Systems   Constitutional:  Negative for diaphoresis and fatigue.  HENT:  Negative for trouble swallowing.   Respiratory:  Negative for cough.   Cardiovascular:  Positive for chest pain. Negative for claudication.  Gastrointestinal:  Negative for abdominal pain, anorexia, heartburn and nausea.  Musculoskeletal:  Negative for back pain.  Neurological:  Negative for dizziness and numbness.   Physical Exam Updated Vital Signs BP 126/74   Pulse 94   Temp 98.2 F (36.8 C) (Oral)   Resp 15   Ht 5\' 2"  (1.575 m)   Wt 72.6 kg   SpO2 100%   BMI 29.26 kg/m  Physical Exam Vitals and nursing note reviewed.  Constitutional:      General: She is not in acute distress.    Appearance: She is well-developed. She is not ill-appearing.  HENT:     Head: Normocephalic and atraumatic.  Eyes:     Extraocular Movements: Extraocular movements intact.     Conjunctiva/sclera: Conjunctivae normal.     Pupils: Pupils are equal, round, and reactive to light.  Cardiovascular:     Rate and Rhythm: Normal rate and regular rhythm.     Pulses:          Radial pulses are 2+ on the right side and 2+ on the left side.     Heart sounds: Normal heart sounds. No murmur heard. Pulmonary:     Effort: Pulmonary effort is normal. No respiratory distress.     Breath  sounds: Normal breath sounds. No decreased breath sounds.  Abdominal:     Palpations: Abdomen is soft.     Tenderness: There is no abdominal tenderness.  Musculoskeletal:        General: No swelling.     Cervical back: Normal range of motion and neck supple.  Skin:    General: Skin is warm and dry.     Capillary Refill: Capillary refill takes less than 2 seconds.     Comments: She has some mild areas of wound dehiscence to bilateral surgical sites in her breasts and lower abdomen but there is no purulent drainage, surrounding redness/erythema  Neurological:     Mental Status: She is alert.  Psychiatric:        Mood and Affect: Mood normal.    ED Results /  Procedures / Treatments   Labs (all labs ordered are listed, but only abnormal results are displayed) Labs Reviewed  CBC WITH DIFFERENTIAL/PLATELET - Abnormal; Notable for the following components:      Result Value   Platelets 419 (*)    All other components within normal limits  D-DIMER, QUANTITATIVE - Abnormal; Notable for the following components:   D-Dimer, Quant 1.24 (*)    All other components within normal limits  BASIC METABOLIC PANEL  PREGNANCY, URINE  TROPONIN I (HIGH SENSITIVITY)    EKG EKG Interpretation  Date/Time:  Thursday August 29 2021 10:29:23 EST Ventricular Rate:  95 PR Interval:  137 QRS Duration: 71 QT Interval:  341 QTC Calculation: 429 R Axis:   71 Text Interpretation: Sinus rhythm Confirmed by Virgina Norfolk ([id number]) on 08/29/2021 10:31:28 AM  Radiology CT Angio Chest PE W and/or Wo Contrast  Result Date: 08/29/2021 CLINICAL DATA:  Positive D-dimer.  Postsurgical tachycardia. EXAM: CT ANGIOGRAPHY CHEST WITH CONTRAST TECHNIQUE: Multidetector CT imaging of the chest was performed using the standard protocol during bolus administration of intravenous contrast. Multiplanar CT image reconstructions and MIPs were obtained to evaluate the vascular anatomy. RADIATION DOSE REDUCTION: This exam was performed according to the departmental dose-optimization program which includes automated exposure control, adjustment of the mA and/or kV according to patient size and/or use of iterative reconstruction technique. CONTRAST:  OMNIPAQUE IOHEXOL 350 MG/ML SOLN COMPARISON:  Chest XR, earlier same day.  CT AP, 09/28/2017. FINDINGS: Cardiovascular: Satisfactory opacification of the pulmonary arteries to the segmental level. No segmental or larger pulmonary embolus. Normal heart size. No pericardial effusion. Mediastinum/Nodes: No enlarged mediastinal, hilar, or axillary lymph nodes. Thyroid gland, trachea, and esophagus demonstrate no significant findings. Lungs/Pleura: Lungs are  clear. No pleural effusion or pneumothorax. Upper Abdomen: No acute abnormality. Mottled appearance of spleen, favored secondary to phase. Musculoskeletal: Bilateral breast implant augmentation. Abdominal and flank subcutaneous edema. No acute osseous findings. Review of the MIP images confirms the above findings. IMPRESSION: 1. No segmental or larger pulmonary embolus. No acute intrathoracic abnormality. 2. Abdominal and flank subcutaneous edema. Electronically Signed   By: Roanna Banning M.D.   On: 08/29/2021 12:13   DG Chest Portable 1 View  Result Date: 08/29/2021 CLINICAL DATA:  Chest pain, shortness of breath following surgery on 07/30/2021 EXAM: PORTABLE CHEST 1 VIEW COMPARISON:  None. FINDINGS: The cardiomediastinal silhouette is normal. There is no focal consolidation or pulmonary edema. There is no pleural effusion or pneumothorax. There is no acute osseous abnormality. IMPRESSION: No radiographic evidence of acute cardiopulmonary process. Electronically Signed   By: Lesia Hausen M.D.   On: 08/29/2021 10:52    Procedures Procedures  Medications Ordered in ED Medications  iohexol (OMNIPAQUE) 350 MG/ML injection 100 mL (100 mLs Intravenous Contrast Given 08/29/21 1205)    ED Course/ Medical Decision Making/ A&P                           Medical Decision Making Amount and/or Complexity of Data Reviewed Labs: ordered. Radiology: ordered.  Risk Prescription drug management.   Lachae Rhynes is here with chest pain, shortness of breath.  Normal vitals.  No fever.  EKG per my review and interpretation shows sinus rhythm.  No ischemic changes.  Old back from [place] after having significant plastic surgery.  Had low hemoglobin after the surgery and needed multiple blood transfusions.  She is curious about having her blood counts rechecked.  She was supposed to see her primary care doctor today but missed the appointment.  She has had some wound dehiscence to her surgical sites.  On exam these  areas appear to be well-healing and no concern for active infection at the sites.  She is having some shortness of breath and palpitations at times.  Differential diagnosis includes anemia, pulmonary embolism.  Have less concern for acute coronary syndrome or infectious process.  Will check CBC, BMP, D-dimer, troponin, chest x-ray.  Per my review and interpretation of labs, patient with elevated D-dimer.  CT scan was ordered and per radiology read there is no pneumonia, no pulmonary embolism.  Troponin normal.  Doubt acute coronary syndrome.  No significant anemia.  Hemoglobin is baseline from recent hospital discharge.  No significant electrolyte abnormality or leukocytosis.  Overall reassurance was given.  Suspect discomfort likely secondary to postop pain.  No concern for infectious process at this time.  Further wound care instructions given.  Discharged in good condition.  This chart was dictated using voice recognition software.  Despite best efforts to proofread,  errors can occur which can change the documentation meaning.         Final Clinical Impression(s) / ED Diagnoses Final diagnoses:  Encounter for wound re-check  Atypical chest pain    Rx / DC Orders ED Discharge Orders     None         Virgina Norfolk, DO 08/29/21 1220

## 2021-08-29 NOTE — Discharge Instructions (Signed)
?  Please report to [facility] ? ?[address] ?[place], [place] [address] ? ? ?

## 2021-08-29 NOTE — ED Triage Notes (Addendum)
First contact with patient. Patient arrived via triage from home with complaints of chest pain and wanted her wounds checked. Patient had a full body plastic surgery on 07/30/21 and is now having chest pain. Pt is A&OX 4. Respirations even/unlabored- speaking in full sentences. Patient changed into gown and placed on cardiac monitor and call light within reach. Patient updated on plan of care. Will continue to monitor patient. ? ?1138: Patient was able to provide a urine sample - sent to lab.   ? ?

## 2021-08-29 NOTE — ED Provider Notes (Signed)
Patient here today for concerns for post-surgical tachycardia, nonhealing surgical wounds, etc. Recommended further evaluation in the ED for stat labs, etc as surgical wounds do appear to be open somewhat on exam with more concern for incision below right breast. Patient is agreeable.  ?  ?Tomi Bamberger, PA-C ?08/29/21 [phone] ? ?

## 2021-08-29 NOTE — ED Notes (Signed)
No acute distress noted upon this RN's departure of patient. Verified discharge paperwork with name and DOB. Vital signs stable. Patient taken to checkout window. Discharge paperwork discussed with patient and mother. No further questions voiced upon discharge.  ? ?

## 2021-09-03 ENCOUNTER — Other Ambulatory Visit: Payer: Self-pay

## 2021-09-03 ENCOUNTER — Encounter: Payer: Self-pay | Admitting: Nurse Practitioner

## 2021-09-03 ENCOUNTER — Ambulatory Visit: Payer: BC Managed Care – PPO | Admitting: Nurse Practitioner

## 2021-09-03 VITALS — BP 100/63 | HR 95 | Temp 98.4°F | Ht 62.0 in | Wt 159.1 lb

## 2021-09-03 DIAGNOSIS — Z6829 Body mass index (BMI) 29.0-29.9, adult: Secondary | ICD-10-CM

## 2021-09-03 DIAGNOSIS — Z9889 Other specified postprocedural states: Secondary | ICD-10-CM

## 2021-09-03 DIAGNOSIS — R634 Abnormal weight loss: Secondary | ICD-10-CM

## 2021-09-03 DIAGNOSIS — T8141XA Infection following a procedure, superficial incisional surgical site, initial encounter: Secondary | ICD-10-CM

## 2021-09-03 MED ORDER — DOXYCYCLINE HYCLATE 100 MG PO TABS: 100.0000 mg | ORAL_TABLET | Freq: Two times a day (BID) | ORAL | 0 refills | Status: DC

## 2021-09-03 NOTE — Progress Notes (Signed)
Established patient visit ? ? ?Patient: Allison Fischer   DOB: Nov 20, 1985   36 y.o. Female  MRN: [id number] ?Visit Date: 09/03/2021 ? ? ?Chief Complaint  ?Patient presents with  ? Follow-up  ? ?Subjective  ?  ?HPI  ?The patient is here for follow up. She did have tummy tuck, breast lift with implants, 360 liposuction, vaginoplasty, and lipotransfusion. Surgery was 07/30/2021. She just went back to work yesterday. Is having trouble with the incisions under her breast. Does have a lump on the side of the right axillary area. Does have a few areas in the abdominal area which are not healing as well as other incisions. Is currently using a spray version of rifampin and neosporin which were initially purchased in [place].  ?-lost a great deal of blood during her surgery. Had to have five pints of blood/blood products while hospitalized in [place]. Is now on iron/folic acid/asorbic acid. Taking centrum, ecchinachea, omega vitamins, and calcium with vitamin d ? -did get lymphatic massages following surgery.  ? ? ? ?Medications: ?Outpatient Medications Prior to Visit  ?Medication Sig  ? fluticasone (FLONASE) 50 MCG/ACT nasal spray Place 2 sprays into both nostrils daily.  ? phentermine 15 MG capsule Take 1 capsule (15 mg total) by mouth every morning.  ? ?No facility-administered medications prior to visit.  ? ? ?Review of Systems  ?Constitutional:  Positive for fatigue. Negative for activity change, appetite change, chills and fever.  ?HENT:  Negative for congestion, postnasal drip, rhinorrhea, sinus pressure, sinus pain, sneezing and sore throat.   ?Eyes: Negative.   ?Respiratory:  Negative for cough, chest tightness, shortness of breath and wheezing.   ?Cardiovascular:  Negative for chest pain and palpitations.  ?Gastrointestinal:  Negative for abdominal pain, constipation, diarrhea, nausea and vomiting.  ?Endocrine: Negative for cold intolerance, heat intolerance, polydipsia and polyuria.  ?Genitourinary:  Negative for  dyspareunia, dysuria, flank pain, frequency and urgency.  ?Musculoskeletal:  Negative for arthralgias, back pain and myalgias.  ?Skin:  Negative for rash.  ?     Non healing surgical incisions under the breasts and along the lower abdomen. Tender and warm to touch. Swelling noted.   ?Allergic/Immunologic: Negative for environmental allergies.  ?Neurological:  Negative for dizziness, weakness and headaches.  ?Hematological:  Negative for adenopathy.  ?Psychiatric/Behavioral:  The patient is not nervous/anxious.   ? ? ? ? Objective  ?  ? ?Today's Vitals  ? 09/03/21 1558  ?BP: 100/63  ?Pulse: 95  ?Temp: 98.4 ?F (36.9 ?C)  ?SpO2: 100%  ?Weight: 159 lb 1.9 oz (72.2 kg)  ?Height: 5\' 2"  (1.575 m)  ? ?Body mass index is 29.1 kg/m?.  ? ?BP Readings from Last 3 Encounters:  ?09/03/21 100/63  ?08/29/21 122/77  ?08/29/21 116/77  ?  ?Wt Readings from Last 3 Encounters:  ?09/03/21 159 lb 1.9 oz (72.2 kg)  ?08/29/21 160 lb (72.6 kg)  ?07/18/21 168 lb 14.4 oz (76.6 kg)  ?  ?Physical Exam ?Vitals and nursing note reviewed.  ?Constitutional:   ?   Appearance: Normal appearance. She is well-developed.  ?HENT:  ?   Head: Normocephalic and atraumatic.  ?Eyes:  ?   Pupils: Pupils are equal, round, and reactive to light.  ?Cardiovascular:  ?   Rate and Rhythm: Normal rate and regular rhythm.  ?   Pulses: Normal pulses.  ?   Heart sounds: Normal heart sounds.  ?Pulmonary:  ?   Effort: Pulmonary effort is normal.  ?   Breath sounds: Normal breath sounds.  ?Chest:  ? ? ?  Abdominal:  ?   Palpations: Abdomen is soft.  ? ? ?Musculoskeletal:     ?   General: Normal range of motion.  ?   Cervical back: Normal range of motion and neck supple.  ?Lymphadenopathy:  ?   Cervical: No cervical adenopathy.  ?Skin: ?   General: Skin is warm and dry.  ?   Capillary Refill: Capillary refill takes less than 2 seconds.  ?Neurological:  ?   General: No focal deficit present.  ?   Mental Status: She is alert and oriented to person, place, and time.   ?Psychiatric:     ?   Mood and Affect: Mood normal.     ?   Behavior: Behavior normal.     ?   Thought Content: Thought content normal.     ?   Judgment: Judgment normal.  ?  ? ? Assessment & Plan  ?  ?1. Superficial incisional infection of surgical site ?Infection present at surgical site beneath the right breast. To lesser degree, underneath the right breast and along the lower abdominal incision. Will start doxycycline 100mg  twice daily for 10 days. Advised her to continue using rifampin spray and neosporin ointment to treat topically.  ?- doxycycline (VIBRA-TABS) 100 MG tablet; Take 1 tablet (100 mg total) by mouth 2 (two) times daily.  Dispense: 20 tablet; Refill: 0 ? ?2. S/P cosmetic plastic surgery ?Patient had extensive plastic surgery 07/30/2021. ? ?3. Weight loss ?Patient interested in continuing with phentermine treatment to help with additional weight loss. Advised her that we would hold off this for now. Encouraged additional healing time. She should continue to eat a low calorie diet and very gradually, add back exercise into her daily routine. Will reassess in three weeks and determine if adding back phentermine is warranted. She is agreeable to this plan.  ? ?  ?Problem List Items Addressed This Visit   ? ?  ? Other  ? Weight loss  ? Superficial incisional infection of surgical site - Primary  ? Relevant Medications  ? doxycycline (VIBRA-TABS) 100 MG tablet  ? S/P cosmetic plastic surgery  ?  ? ?Return in about 3 weeks (around 09/24/2021) for routine - weight management.  ?   ? ? ? ? ?11/24/2021, NP  ?[facility] ?[phone] (phone) ?[phone] (fax) ? ?[facility]  ?

## 2021-09-12 DIAGNOSIS — Z9889 Other specified postprocedural states: Secondary | ICD-10-CM | POA: Insufficient documentation

## 2021-09-12 DIAGNOSIS — T8141XA Infection following a procedure, superficial incisional surgical site, initial encounter: Secondary | ICD-10-CM | POA: Insufficient documentation

## 2021-09-24 ENCOUNTER — Encounter: Payer: Self-pay | Admitting: Nurse Practitioner

## 2021-09-24 ENCOUNTER — Ambulatory Visit (INDEPENDENT_AMBULATORY_CARE_PROVIDER_SITE_OTHER): Payer: BC Managed Care – PPO | Admitting: Nurse Practitioner

## 2021-09-24 VITALS — BP 91/60 | HR 68 | Temp 98.6°F | Ht 61.81 in | Wt 159.2 lb

## 2021-09-24 DIAGNOSIS — Z6829 Body mass index (BMI) 29.0-29.9, adult: Secondary | ICD-10-CM

## 2021-09-24 DIAGNOSIS — Z6830 Body mass index (BMI) 30.0-30.9, adult: Secondary | ICD-10-CM

## 2021-09-24 DIAGNOSIS — T8141XA Infection following a procedure, superficial incisional surgical site, initial encounter: Secondary | ICD-10-CM | POA: Diagnosis not present

## 2021-09-24 MED ORDER — DOXYCYCLINE HYCLATE 100 MG PO TABS: 100.0000 mg | ORAL_TABLET | Freq: Two times a day (BID) | ORAL | 0 refills | Status: DC

## 2021-09-24 MED ORDER — PHENTERMINE HCL 15 MG PO CAPS: 15.0000 mg | ORAL_CAPSULE | ORAL | 0 refills | Status: DC

## 2021-09-24 NOTE — Patient Instructions (Signed)
Fat and Cholesterol Restricted Eating Plan Getting too much fat and cholesterol in your diet may cause health problems. Choosing the right foods helps keep your fat and cholesterol at normal levels. This can keep you from getting certain diseases. Your doctor may recommend an eating plan that includes: Total fat: ______% or less of total calories a day. This is ______g of fat a day. Saturated fat: ______% or less of total calories a day. This is ______g of saturated fat a day. Cholesterol: less than _________mg a day. Fiber: ______g a day. What are tips for following this plan? General tips Work with your doctor to lose weight if you need to. Avoid: Foods with added sugar. Fried foods. Foods with trans fat or partially hydrogenated oils. This includes some margarines and baked goods. If you drink alcohol: Limit how much you have to: 0-1 drink a day for women who are not pregnant. 0-2 drinks a day for men. Know how much alcohol is in a drink. In the [place] one drink equals one 12 oz bottle of beer (355 mL), one 5 oz glass of wine (148 mL), or one 1 oz glass of hard liquor (44 mL). Reading food labels Check food labels for: Trans fats. Partially hydrogenated oils. Saturated fat (g) in each serving. Cholesterol (mg) in each serving. Fiber (g) in each serving. Choose foods with healthy fats, such as: Monounsaturated fats and polyunsaturated fats. These include olive and canola oil, flaxseeds, walnuts, almonds, and seeds. Omega-3 fats. These are found in certain fish, flaxseed oil, and ground flaxseeds. Choose grain products that have whole grains. Look for the word "whole" as the first word in the ingredient list. Cooking Cook foods using low-fat methods. These include baking, boiling, grilling, and broiling. Eat more home-cooked foods. Eat at restaurants and buffets less often. Eat less fast food. Avoid cooking using saturated fats, such as butter, cream, palm oil, palm kernel oil, and  coconut oil. Meal planning  At meals, divide your plate into four equal parts: Fill one-half of your plate with vegetables, green salads, and fruit. Fill one-fourth of your plate with whole grains. Fill one-fourth of your plate with low-fat (lean) protein foods. Eat fish that is high in omega-3 fats at least two times a week. This includes mackerel, tuna, sardines, and salmon. Eat foods that are high in fiber, such as whole grains, beans, apples, pears, berries, broccoli, carrots, peas, and barley. What foods should I eat? Fruits All fresh, canned (in natural juice), or frozen fruits. Vegetables Fresh or frozen vegetables (raw, steamed, roasted, or grilled). Green salads. Grains Whole grains, such as whole wheat or whole grain breads, crackers, cereals, and pasta. Unsweetened oatmeal, bulgur, barley, quinoa, or brown rice. Corn or whole wheat flour tortillas. Meats and other protein foods Ground beef (85% or leaner), grass-fed beef, or beef trimmed of fat. Skinless chicken or turkey. Ground chicken or turkey. Pork trimmed of fat. All fish and seafood. Egg whites. Dried beans, peas, or lentils. Unsalted nuts or seeds. Unsalted canned beans. Nut butters without added sugar or oil. Dairy Low-fat or nonfat dairy products, such as skim or 1% milk, 2% or reduced-fat cheeses, low-fat and fat-free ricotta or cottage cheese, or plain low-fat and nonfat yogurt. Fats and oils Tub margarine without trans fats. Light or reduced-fat mayonnaise and salad dressings. Avocado. Olive, canola, sesame, or safflower oils. The items listed above may not be a complete list of foods and beverages you can eat. Contact a dietitian for more information. What foods   should I avoid? Fruits Canned fruit in heavy syrup. Fruit in cream or butter sauce. Fried fruit. Vegetables Vegetables cooked in cheese, cream, or butter sauce. Fried vegetables. Grains White bread. White pasta. White rice. Cornbread. Bagels, pastries,  and croissants. Crackers and snack foods that contain trans fat and hydrogenated oils. Meats and other protein foods Fatty cuts of meat. Ribs, chicken wings, bacon, sausage, bologna, salami, chitterlings, fatback, hot dogs, bratwurst, and packaged lunch meats. Liver and organ meats. Whole eggs and egg yolks. Chicken and turkey with skin. Fried meat. Dairy Whole or 2% milk, cream, half-and-half, and cream cheese. Whole milk cheeses. Whole-fat or sweetened yogurt. Full-fat cheeses. Nondairy creamers and whipped toppings. Processed cheese, cheese spreads, and cheese curds. Fats and oils Butter, stick margarine, lard, shortening, ghee, or bacon fat. Coconut, palm kernel, and palm oils. Beverages Alcohol. Sugar-sweetened drinks such as sodas, lemonade, and fruit drinks. Sweets and desserts Corn syrup, sugars, honey, and molasses. Candy. Jam and jelly. Syrup. Sweetened cereals. Cookies, pies, cakes, donuts, muffins, and ice cream. The items listed above may not be a complete list of foods and beverages you should avoid. Contact a dietitian for more information. Summary Choosing the right foods helps keep your fat and cholesterol at normal levels. This can keep you from getting certain diseases. At meals, fill one-half of your plate with vegetables, green salads, and fruits. Eat high fiber foods, like whole grains, beans, apples, pears, berries, carrots, peas, and barley. Limit added sugar, saturated fats, alcohol, and fried foods. This information is not intended to replace advice given to you by your health care provider. Make sure you discuss any questions you have with your health care provider. Document Revised: 10/19/2020 Document Reviewed: 10/19/2020 Elsevier Patient Education  2022 [employer]vier Inc.  

## 2021-09-24 NOTE — Progress Notes (Signed)
Established patient visit ? ? ?Patient: Allison Fischer   DOB: 10-06-1985   36 y.o. Female  MRN: [id number] ?Visit Date: 09/24/2021 ? ? ?Chief Complaint  ?Patient presents with  ? Weight Check  ? ?Subjective  ?  ?HPI  ?The patient presents for follow up visit. She did have tummy tuck, breast lift with implants, 360 liposuction, vaginoplasty, and lipotransfusion. Surgery was 07/30/2021. Had trouble with the incisions under her breast. Did have a lump on the side of the right axillary area. There were a few areas in the abdominal area which were not healing as well as other incisions.  She has completed a 14-day course of doxycycline.  States that incisions have healed incredibly well.  She does state that today she describes incision under the right breast in this area reopened and is irritated.  She states that other problems have improved significantly. ?She has maintained her weight since most recent visit.  I inquired about restarting on phentermine to help continue her weight loss journey.  Her goal weight is approximately 140 pounds.  She has been on phentermine prior to her surgery in February.  She did very well without any negative would like to restart.  She states this week she has started going back to the gym.  She plans to take 1 class III times a week and will gradually increase her exercise program as tolerated. ?Today's weight - 159 lbs ?Restart Phentermine - 09/24/2021 ? ? ?Medications: ?Outpatient Medications Prior to Visit  ?Medication Sig  ? fluticasone (FLONASE) 50 MCG/ACT nasal spray Place 2 sprays into both nostrils daily.  ? [DISCONTINUED] doxycycline (VIBRA-TABS) 100 MG tablet Take 1 tablet (100 mg total) by mouth 2 (two) times daily. (Patient not taking: Reported on 09/24/2021)  ? [DISCONTINUED] phentermine 15 MG capsule Take 1 capsule (15 mg total) by mouth every morning. (Patient not taking: Reported on 09/24/2021)  ? ?No facility-administered medications prior to visit.  ? ? ?Review of Systems   ?Constitutional:  Positive for fatigue. Negative for activity change, appetite change, chills and fever.  ?HENT:  Negative for congestion, postnasal drip, rhinorrhea, sinus pressure, sinus pain, sneezing and sore throat.   ?Eyes: Negative.   ?Respiratory:  Negative for cough, chest tightness, shortness of breath and wheezing.   ?Cardiovascular:  Negative for chest pain and palpitations.  ?Gastrointestinal:  Negative for abdominal pain, constipation, diarrhea, nausea and vomiting.  ?Endocrine: Negative for cold intolerance, heat intolerance, polydipsia and polyuria.  ?Genitourinary:  Negative for dyspareunia, dysuria, flank pain, frequency and urgency.  ?Musculoskeletal:  Negative for arthralgias, back pain and myalgias.  ?Skin:  Negative for rash.  ?     Symptoms on abdominal and pelvic area of healed.  There is a single area under the right breast which reopened earlier today.  It is red and irritated.  ?Allergic/Immunologic: Negative for environmental allergies.  ?Neurological:  Negative for dizziness, weakness and headaches.  ?Hematological:  Negative for adenopathy.  ?Psychiatric/Behavioral:  The patient is not nervous/anxious.   ? ? ? ? Objective  ?  ? ?Today's Vitals  ? 09/24/21 1613  ?BP: 91/60  ?Pulse: 68  ?Temp: 98.6 ?F (37 ?C)  ?SpO2: 100%  ?Weight: 159 lb 3.2 oz (72.2 kg)  ?Height: 5' 1.81" (1.57 m)  ? ?Body mass index is 29.3 kg/m?.  ? ?BP Readings from Last 3 Encounters:  ?09/24/21 91/60  ?09/03/21 100/63  ?08/29/21 122/77  ?  ?Wt Readings from Last 3 Encounters:  ?09/24/21 159 lb 3.2 oz (72.2 kg)  ?  09/03/21 159 lb 1.9 oz (72.2 kg)  ?08/29/21 160 lb (72.6 kg)  ?  ?Physical Exam ?Vitals and nursing note reviewed.  ?Constitutional:   ?   Appearance: Normal appearance. She is well-developed.  ?HENT:  ?   Head: Normocephalic and atraumatic.  ?Eyes:  ?   Pupils: Pupils are equal, round, and reactive to light.  ?Cardiovascular:  ?   Rate and Rhythm: Normal rate and regular rhythm.  ?   Pulses: Normal pulses.   ?   Heart sounds: Normal heart sounds.  ?Pulmonary:  ?   Effort: Pulmonary effort is normal.  ?   Breath sounds: Normal breath sounds.  ?Chest:  ? ? ?Abdominal:  ?   Palpations: Abdomen is soft.  ?Musculoskeletal:     ?   General: Normal range of motion.  ?   Cervical back: Normal range of motion and neck supple.  ?Lymphadenopathy:  ?   Cervical: No cervical adenopathy.  ?Skin: ?   General: Skin is warm and dry.  ?   Capillary Refill: Capillary refill takes less than 2 seconds.  ?Neurological:  ?   General: No focal deficit present.  ?   Mental Status: She is alert and oriented to person, place, and time.  ?Psychiatric:     ?   Mood and Affect: Mood normal.     ?   Behavior: Behavior normal.     ?   Thought Content: Thought content normal.     ?   Judgment: Judgment normal.  ?  ? ? Assessment & Plan  ?  ?1. Superficial incisional infection of surgical site ?In general, great deal of improvement with the surgical sites other than single area under the right breast.  We will start doxycycline twice daily for 10 days.  She should keep wound clean dry.  Keep covered when having to wear a bra.  Left open to air when mammogram is unnecessary. ?- doxycycline (VIBRA-TABS) 100 MG tablet; Take 1 tablet (100 mg total) by mouth 2 (two) times daily.  Dispense: 20 tablet; Refill: 0 ? ?2. Body mass index 29.0-29.9, adult ?Restart phentermine 15 mg daily. Discussed lowering calorie intake to 1500 calories per day and incorporating exercise into daily routine to help lose weight.  Recommend she slowly restart her exercise routine.  Increase time in intensity as tolerated.  We will reassess in 1 month. ?- phentermine 15 MG capsule; Take 1 capsule (15 mg total) by mouth every morning.  Dispense: 30 capsule; Refill: 0  ?Problem List Items Addressed This Visit   ? ?  ? Other  ? Superficial incisional infection of surgical site - Primary  ? Relevant Medications  ? doxycycline (VIBRA-TABS) 100 MG tablet  ? Body mass index 29.0-29.9,  adult  ? Relevant Medications  ? phentermine 15 MG capsule  ?  ? ?Return in about 4 weeks (around 10/22/2021) for routine - weight management.  ?   ? ? ? ? ?Carlean Jews, NP  ?[facility] ?[phone] (phone) ?[phone] (fax) ? ?[facility]  ?

## 2021-10-06 DIAGNOSIS — Z6829 Body mass index (BMI) 29.0-29.9, adult: Secondary | ICD-10-CM | POA: Insufficient documentation

## 2021-10-22 ENCOUNTER — Ambulatory Visit (INDEPENDENT_AMBULATORY_CARE_PROVIDER_SITE_OTHER): Payer: BC Managed Care – PPO | Admitting: Nurse Practitioner

## 2021-10-22 ENCOUNTER — Encounter: Payer: Self-pay | Admitting: Nurse Practitioner

## 2021-10-22 VITALS — BP 100/63 | HR 80 | Temp 97.7°F | Ht 62.5 in | Wt 161.1 lb

## 2021-10-22 DIAGNOSIS — Z6829 Body mass index (BMI) 29.0-29.9, adult: Secondary | ICD-10-CM

## 2021-10-22 DIAGNOSIS — T8141XA Infection following a procedure, superficial incisional surgical site, initial encounter: Secondary | ICD-10-CM | POA: Diagnosis not present

## 2021-10-22 MED ORDER — PHENTERMINE HCL 15 MG PO CAPS
15.0000 mg | ORAL_CAPSULE | ORAL | 0 refills | Status: DC
Start: 2021-10-22 — End: 2021-11-20

## 2021-10-22 NOTE — Patient Instructions (Signed)
Healthy Living: Exercise Tips ?Exercise is an important part of a healthy lifestyle. You will learn tips to create an exercise plan that works best for you. ?To view the content, go to this web address: ?[email or web address] ? ?This video will expire on: 09/10/2023. If you need access to this video following this date, please reach out to the healthcare provider who assigned it to you. ?This information is not intended to replace advice given to you by your health care provider. Make sure you discuss any questions you have with your health care provider. ?Elsevier Patient Education ? 2023 [employer]vier Inc[employer] ? ?

## 2021-10-22 NOTE — Progress Notes (Signed)
Established patient visit ? ? ?Patient: Allison Fischer   DOB: Dec 31, 1985   36 y.o. Female  MRN: [id number] ?Visit Date: 10/22/2021 ? ? ?Chief Complaint  ?Patient presents with  ? Weight Check  ? ?Subjective  ?  ?HPI  ?The patient is here for weight loss follow up.  ?-has been able to get back t the gym 4 days per week. Gradually working on getting to 5 days per week, taking the weekends off. Did have extensive cosmetic surgery and continues to heal from this.  ?-concerned about slow weight gain since she had her surgery. Surgery was done 07/30/2021. She states that she weighed 155 directly after surgery.  ?-did have several areas of infection of incisions. She has completed antibiotics and she states that incisions have healed.  ?She would like to meet her weight loss goal of 145 pounds.  ?-currently on phentermine 15mg  daily. Has no negative side effects from taking this medication.  ? ? ?Medications: ?Outpatient Medications Prior to Visit  ?Medication Sig  ? doxycycline (VIBRA-TABS) 100 MG tablet Take 1 tablet (100 mg total) by mouth 2 (two) times daily.  ? fluticasone (FLONASE) 50 MCG/ACT nasal spray Place 2 sprays into both nostrils daily.  ? [DISCONTINUED] phentermine 15 MG capsule Take 1 capsule (15 mg total) by mouth every morning.  ? ?No facility-administered medications prior to visit.  ? ? ?Review of Systems  ?Constitutional:  Negative for activity change, appetite change, chills, fatigue and fever.  ?     Two pound weight gain since last visit  ?HENT:  Negative for congestion, postnasal drip, rhinorrhea, sinus pressure, sinus pain, sneezing and sore throat.   ?Eyes: Negative.   ?Respiratory:  Negative for cough, chest tightness, shortness of breath and wheezing.   ?Cardiovascular:  Negative for chest pain and palpitations.  ?Gastrointestinal:  Negative for abdominal pain, constipation, diarrhea, nausea and vomiting.  ?Endocrine: Negative for cold intolerance, heat intolerance, polydipsia and polyuria.   ?Genitourinary:  Negative for dyspareunia, dysuria, flank pain, frequency and urgency.  ?Musculoskeletal:  Negative for arthralgias, back pain and myalgias.  ?Skin:  Negative for rash.  ?     Patient states that incisional infections have healed.   ?Allergic/Immunologic: Negative for environmental allergies.  ?Neurological:  Negative for dizziness, weakness and headaches.  ?Hematological:  Negative for adenopathy.  ?Psychiatric/Behavioral:  The patient is not nervous/anxious.   ? ? ? ? Objective  ?  ? ?Today's Vitals  ? 10/22/21 1615  ?BP: 100/63  ?Pulse: 80  ?Temp: 97.7 ?F (36.5 ?C)  ?SpO2: 100%  ?Weight: 161 lb 1.9 oz (73.1 kg)  ?Height: 5' 2.5" (1.588 m)  ? ?Body mass index is 29 kg/m?.  ? ?BP Readings from Last 3 Encounters:  ?10/22/21 100/63  ?09/24/21 91/60  ?09/03/21 100/63  ?  ?Wt Readings from Last 3 Encounters:  ?10/22/21 161 lb 1.9 oz (73.1 kg)  ?09/24/21 159 lb 3.2 oz (72.2 kg)  ?09/03/21 159 lb 1.9 oz (72.2 kg)  ?  ?Physical Exam ?Vitals and nursing note reviewed.  ?Constitutional:   ?   Appearance: Normal appearance. She is well-developed.  ?HENT:  ?   Head: Normocephalic and atraumatic.  ?Eyes:  ?   Pupils: Pupils are equal, round, and reactive to light.  ?Cardiovascular:  ?   Rate and Rhythm: Normal rate and regular rhythm.  ?   Pulses: Normal pulses.  ?   Heart sounds: Normal heart sounds.  ?Pulmonary:  ?   Effort: Pulmonary effort is normal.  ?  Breath sounds: Normal breath sounds.  ?Abdominal:  ?   Palpations: Abdomen is soft.  ?Musculoskeletal:     ?   General: Normal range of motion.  ?   Cervical back: Normal range of motion and neck supple.  ?Lymphadenopathy:  ?   Cervical: No cervical adenopathy.  ?Skin: ?   General: Skin is warm and dry.  ?   Capillary Refill: Capillary refill takes less than 2 seconds.  ?Neurological:  ?   General: No focal deficit present.  ?   Mental Status: She is alert and oriented to person, place, and time.  ?Psychiatric:     ?   Mood and Affect: Mood normal.     ?    Behavior: Behavior normal.     ?   Thought Content: Thought content normal.     ?   Judgment: Judgment normal.  ?  ? ? Assessment & Plan  ?  ?1. Superficial incisional infection of surgical site ?Resolved. Will continue to monitor  ? ?2. Body mass index 29.0-29.9, adult ?Patient has had weight gain of 2 pounds since last visit. Discussed lowering calorie intake to 1500 calories per day and incorporating exercise into daily routine to help lose weight. Will consider increasing dose of phentermine at next visit if no improvement over next 30 days.  ?- phentermine 15 MG capsule; Take 1 capsule (15 mg total) by mouth every morning.  Dispense: 30 capsule; Refill: 0  ? ?Problem List Items Addressed This Visit   ? ?  ? Other  ? Superficial incisional infection of surgical site - Primary  ? Body mass index 29.0-29.9, adult  ? Relevant Medications  ? phentermine 15 MG capsule  ?  ? ?Return in about 4 weeks (around 11/19/2021) for weight check.  ?   ? ? ? ? ?Ronnell Freshwater, NP  ?[facility] ?[phone] (phone) ?[phone] (fax) ? ?[facility]  ?

## 2021-11-20 ENCOUNTER — Ambulatory Visit (INDEPENDENT_AMBULATORY_CARE_PROVIDER_SITE_OTHER): Payer: BC Managed Care – PPO | Admitting: Nurse Practitioner

## 2021-11-20 ENCOUNTER — Encounter: Payer: Self-pay | Admitting: Nurse Practitioner

## 2021-11-20 VITALS — BP 95/61 | HR 90 | Temp 98.1°F | Ht 62.6 in | Wt 163.0 lb

## 2021-11-20 DIAGNOSIS — Z6829 Body mass index (BMI) 29.0-29.9, adult: Secondary | ICD-10-CM

## 2021-11-20 DIAGNOSIS — R5383 Other fatigue: Secondary | ICD-10-CM

## 2021-11-20 MED ORDER — PHENTERMINE HCL 15 MG PO CAPS: 15.0000 mg | ORAL_CAPSULE | ORAL | 0 refills | Status: DC

## 2021-11-20 NOTE — Progress Notes (Signed)
Established patient visit   Patient: Allison Fischer   DOB: 1985/07/24   36 y.o. Female  MRN: [id number] Visit Date: 11/20/2021   Chief Complaint  Patient presents with   Weight Check   Subjective    HPI  The patient is here for follow up weight loss.  -restarted on phentermine after having extensive plastic surgery.  -restarted on 09/24/2021 -weight at start - 159 -most recent weight - 10/22/2021 - 161 Today's weight - 11/20/2021 - 163 Weight change since last visit - 2 pound weight gain Total weight change since restart - 4 pound weight gain  Patient has been unable to take medication or go to gym in two weeks due to upper respiratory infection. She has felt poorly. Has been continuing to work, but does not have the energy for working out. Feels like this may be cause of weight gain.   She reports no negative side effects from taking phentermine.   Medications: Outpatient Medications Prior to Visit  Medication Sig   doxycycline (VIBRA-TABS) 100 MG tablet Take 1 tablet (100 mg total) by mouth 2 (two) times daily.   fluticasone (FLONASE) 50 MCG/ACT nasal spray Place 2 sprays into both nostrils daily.   [DISCONTINUED] phentermine 15 MG capsule Take 1 capsule (15 mg total) by mouth every morning.   No facility-administered medications prior to visit.    Review of Systems  Constitutional:  Negative for activity change, appetite change, chills, fatigue and fever.  HENT:  Negative for congestion, postnasal drip, rhinorrhea, sinus pressure, sinus pain, sneezing and sore throat.   Eyes: Negative.   Respiratory:  Negative for cough, chest tightness, shortness of breath and wheezing.   Cardiovascular:  Negative for chest pain and palpitations.  Gastrointestinal:  Negative for abdominal pain, constipation, diarrhea, nausea and vomiting.  Endocrine: Negative for cold intolerance, heat intolerance, polydipsia and polyuria.  Genitourinary:  Negative for dyspareunia, dysuria, flank pain,  frequency and urgency.  Musculoskeletal:  Negative for arthralgias, back pain and myalgias.  Skin:  Negative for rash.  Allergic/Immunologic: Negative for environmental allergies.  Neurological:  Negative for dizziness, weakness and headaches.  Hematological:  Negative for adenopathy.  Psychiatric/Behavioral:  The patient is not nervous/anxious.       Objective     Today's Vitals   11/20/21 0959  BP: 95/61  Pulse: 90  Temp: 98.1 F (36.7 C)  SpO2: 100%  Weight: 163 lb (73.9 kg)  Height: 5' 2.6" (1.59 m)   Body mass index is 29.25 kg/m.   BP Readings from Last 3 Encounters:  11/20/21 95/61  10/22/21 100/63  09/24/21 91/60    Wt Readings from Last 3 Encounters:  11/20/21 163 lb (73.9 kg)  10/22/21 161 lb 1.9 oz (73.1 kg)  09/24/21 159 lb 3.2 oz (72.2 kg)    Physical Exam Vitals and nursing note reviewed.  Constitutional:      Appearance: Normal appearance. She is well-developed.  HENT:     Head: Normocephalic.  Eyes:     Pupils: Pupils are equal, round, and reactive to light.  Cardiovascular:     Rate and Rhythm: Normal rate and regular rhythm.     Pulses: Normal pulses.     Heart sounds: Normal heart sounds.  Pulmonary:     Effort: Pulmonary effort is normal.     Breath sounds: Normal breath sounds.  Abdominal:     Palpations: Abdomen is soft.  Musculoskeletal:        General: Normal range of motion.  Cervical back: Normal range of motion and neck supple.  Lymphadenopathy:     Cervical: No cervical adenopathy.  Skin:    General: Skin is warm and dry.     Capillary Refill: Capillary refill takes less than 2 seconds.  Neurological:     General: No focal deficit present.     Mental Status: She is alert and oriented to person, place, and time.  Psychiatric:        Mood and Affect: Mood normal.        Behavior: Behavior normal.        Thought Content: Thought content normal.        Judgment: Judgment normal.      Assessment & Plan    1. Other  fatigue Improving with recovery from surgery. Will continue to monitor.   2. Body mass index 29.0-29.9, adult Will continue phentermine 15mg  daily. She should continue with lower calorie intake to 1500 calories per day and incorporating exercise into daily routine to help lose weight.  - phentermine 15 MG capsule; Take 1 capsule (15 mg total) by mouth every morning.  Dispense: 30 capsule; Refill: 0    Problem List Items Addressed This Visit       Other   Other fatigue - Primary   Body mass index 29.0-29.9, adult   Relevant Medications   phentermine 15 MG capsule     Return in about 4 weeks (around 12/18/2021) for routine - weight management.         12/20/2021, NP  [facility] [phone] (phone) [phone] (fax)  [facility]

## 2021-11-21 ENCOUNTER — Ambulatory Visit: Payer: BC Managed Care – PPO | Admitting: Nurse Practitioner

## 2021-12-20 ENCOUNTER — Ambulatory Visit: Payer: BC Managed Care – PPO | Admitting: Nurse Practitioner

## 2021-12-23 ENCOUNTER — Ambulatory Visit (INDEPENDENT_AMBULATORY_CARE_PROVIDER_SITE_OTHER): Payer: BC Managed Care – PPO | Admitting: Nurse Practitioner

## 2021-12-23 ENCOUNTER — Encounter: Payer: Self-pay | Admitting: Nurse Practitioner

## 2021-12-23 VITALS — BP 95/61 | HR 91 | Ht 62.6 in | Wt 164.4 lb

## 2021-12-23 DIAGNOSIS — S60021A Contusion of right index finger without damage to nail, initial encounter: Secondary | ICD-10-CM

## 2021-12-23 DIAGNOSIS — Z6829 Body mass index (BMI) 29.0-29.9, adult: Secondary | ICD-10-CM

## 2021-12-23 MED ORDER — PHENTERMINE HCL 15 MG PO CAPS: 30.0000 mg | ORAL_CAPSULE | ORAL | 0 refills | Status: DC

## 2021-12-23 NOTE — Progress Notes (Signed)
Established patient visit   Patient: Allison Fischer   DOB: [birth year]   36 y.o. Female  MRN: [id number] Visit Date: 12/23/2021   Chief Complaint  Patient presents with   Weight Check   Subjective    HPI  The patient is here for follow up weight loss.  -restarted on phentermine after having extensive plastic surgery.  -restarted on 09/24/2021 -weight at start - 159 -most recent weight - 11/20/2021 - 163 Today's weight 12/23/2021 - 164  Weight change since last visit - weight gain 1 pound Total weight change since restart - 5 pound weight gain -conitnues to exercise 4 to 5 days per week. Limiting calorie intake to 1500 calories and most days less.   -hit index finger of right hand on wall, felt it dislocate when she hit it.  -swollen.  -discolored -decreased ROM -still painful  -did apply ice to the finger and take ibuprofen the first night of the injury. Has not done anything or taken anything since then. Has been 3 to 4 weeks.   Medications: Outpatient Medications Prior to Visit  Medication Sig   fluticasone (FLONASE) 50 MCG/ACT nasal spray Place 2 sprays into both nostrils daily.   [DISCONTINUED] phentermine 15 MG capsule Take 1 capsule (15 mg total) by mouth every morning.   doxycycline (VIBRA-TABS) 100 MG tablet Take 1 tablet (100 mg total) by mouth 2 (two) times daily. (Patient not taking: Reported on 12/23/2021)   No facility-administered medications prior to visit.    Review of Systems  Constitutional:  Negative for activity change, appetite change, chills, fatigue and fever.       Difficulty with weight loss following reconstructive surgery.  HENT:  Negative for congestion, postnasal drip, rhinorrhea, sinus pressure, sinus pain, sneezing and sore throat.   Eyes: Negative.   Respiratory:  Negative for cough, chest tightness, shortness of breath and wheezing.   Cardiovascular:  Negative for chest pain and palpitations.  Gastrointestinal:  Negative for abdominal pain,  constipation, diarrhea, nausea and vomiting.  Endocrine: Negative for cold intolerance, heat intolerance, polydipsia and polyuria.  Genitourinary:  Negative for dyspareunia, dysuria, flank pain, frequency and urgency.  Musculoskeletal:  Positive for arthralgias. Negative for back pain and myalgias.       Pain and swelling of right index finger.  Skin:  Negative for rash.  Allergic/Immunologic: Negative for environmental allergies.  Neurological:  Negative for dizziness, weakness and headaches.  Hematological:  Negative for adenopathy.  Psychiatric/Behavioral:  The patient is not nervous/anxious.        Objective     Today's Vitals   12/23/21 1603  BP: 95/61  Pulse: 91  SpO2: 100%  Weight: 164 lb 6.4 oz (74.6 kg)  Height: 5' 2.6" (1.59 m)   Body mass index is 29.5 kg/m.   BP Readings from Last 3 Encounters:  12/23/21 95/61  11/20/21 95/61  10/22/21 100/63    Wt Readings from Last 3 Encounters:  12/23/21 164 lb 6.4 oz (74.6 kg)  11/20/21 163 lb (73.9 kg)  10/22/21 161 lb 1.9 oz (73.1 kg)    Physical Exam Vitals and nursing note reviewed.  Constitutional:      Appearance: Normal appearance. She is well-developed.  HENT:     Head: Normocephalic and atraumatic.     Nose: Nose normal.     Mouth/Throat:     Mouth: Mucous membranes are moist.     Pharynx: Oropharynx is clear.  Eyes:     Extraocular Movements: Extraocular movements intact.  Conjunctiva/sclera: Conjunctivae normal.     Pupils: Pupils are equal, round, and reactive to light.  Cardiovascular:     Rate and Rhythm: Normal rate and regular rhythm.     Pulses: Normal pulses.     Heart sounds: Normal heart sounds.  Pulmonary:     Effort: Pulmonary effort is normal.     Breath sounds: Normal breath sounds.  Abdominal:     Palpations: Abdomen is soft.  Musculoskeletal:        General: Normal range of motion.       Arms:     Cervical back: Normal range of motion and neck supple.  Lymphadenopathy:      Cervical: No cervical adenopathy.  Skin:    General: Skin is warm and dry.     Capillary Refill: Capillary refill takes less than 2 seconds.  Neurological:     General: No focal deficit present.     Mental Status: She is alert and oriented to person, place, and time.  Psychiatric:        Mood and Affect: Mood normal.        Behavior: Behavior normal.        Thought Content: Thought content normal.        Judgment: Judgment normal.      Assessment & Plan    1. Contusion of right index finger without damage to nail, initial encounter Bruising of distal right index finger.  Range of motion and strength preserved but diminished.  Advised her to rest and ice her right index finger.  Recommend gentle range of motion exercises.  If worsens or does not improve over the next 2 to 3 weeks, will x-ray and refer to orthopedics.  2. Body mass index 29.0-29.9, adult Increase dose of phentermine to 30 mg daily. Discussed lowering calorie intake to 1500 calories per day and incorporating exercise into daily routine to help lose weight.  - phentermine 15 MG capsule; Take 2 capsules (30 mg total) by mouth every morning.  Dispense: 60 capsule; Refill: 0    Problem List Items Addressed This Visit       Other   Body mass index 29.0-29.9, adult   Relevant Medications   phentermine 15 MG capsule   Contusion of right index finger without damage to nail - Primary     Return in about 4 weeks (around 01/20/2022) for routine - weight management.         Carlean Jews, NP  [facility] [phone] (phone) [phone] (fax)  [facility]

## 2022-01-05 DIAGNOSIS — S60021A Contusion of right index finger without damage to nail, initial encounter: Secondary | ICD-10-CM | POA: Insufficient documentation

## 2022-01-22 ENCOUNTER — Ambulatory Visit (INDEPENDENT_AMBULATORY_CARE_PROVIDER_SITE_OTHER): Payer: BC Managed Care – PPO | Admitting: Nurse Practitioner

## 2022-01-22 ENCOUNTER — Encounter: Payer: Self-pay | Admitting: Nurse Practitioner

## 2022-01-22 VITALS — BP 101/66 | HR 84 | Temp 97.7°F | Ht 62.5 in | Wt 166.0 lb

## 2022-01-22 DIAGNOSIS — R635 Abnormal weight gain: Secondary | ICD-10-CM

## 2022-01-22 DIAGNOSIS — Z6829 Body mass index (BMI) 29.0-29.9, adult: Secondary | ICD-10-CM

## 2022-01-22 MED ORDER — PHENTERMINE HCL 15 MG PO CAPS: 15.0000 mg | ORAL_CAPSULE | Freq: Two times a day (BID) | ORAL | 0 refills | Status: DC

## 2022-01-22 NOTE — Progress Notes (Signed)
Established patient visit   Patient: Allison Fischer   DOB: [birth year]   36 y.o. Female  MRN: [id number] Visit Date: 01/22/2022   Chief Complaint  Patient presents with   Weight Check   Subjective    HPI  The patient is here for follow up weight loss.  -restarted on phentermine after having extensive plastic surgery.  -restarted on 09/24/2021 -weight at start - 09/24/2021 - 159 -most recent weight - 12/23/2021- 164 -Today's weight 01/22/2022- 168 --Weight change since last visit - weight gain 4 pounds -Total weight change since restart - 9 pound weight gain -increased phentermine dosing to 30 mg daily. We conitnued the 15 mg capsule and she is taking two first thing in the morning.  -she states that this is doing well for her during the day while she is busy and at work. She states that at night she is getting hungrier.  -conitnues to exercise 4 to 5 days per week. She is taing spin classes two to three days per week. She is also taking pump classes two times daily. Limiting calorie intake to 1500 calories and most days less. She consumes a diet high in protein and eats mostly vegetables for lunch and dinner.  She states that she has noted increased muscle definition.    Medications: Outpatient Medications Prior to Visit  Medication Sig   fluticasone (FLONASE) 50 MCG/ACT nasal spray Place 2 sprays into both nostrils daily.   [DISCONTINUED] phentermine 15 MG capsule Take 2 capsules (30 mg total) by mouth every morning.   doxycycline (VIBRA-TABS) 100 MG tablet Take 1 tablet (100 mg total) by mouth 2 (two) times daily. (Patient not taking: Reported on 12/23/2021)   No facility-administered medications prior to visit.    Review of Systems  Constitutional:  Negative for activity change, appetite change, chills, fatigue and fever.       Four pound weight gain since last visit.   HENT:  Negative for congestion, postnasal drip, rhinorrhea, sinus pressure, sinus pain, sneezing and sore throat.    Eyes: Negative.   Respiratory:  Negative for cough, chest tightness, shortness of breath and wheezing.   Cardiovascular:  Negative for chest pain and palpitations.  Gastrointestinal:  Negative for abdominal pain, constipation, diarrhea, nausea and vomiting.  Endocrine: Negative for cold intolerance, heat intolerance, polydipsia and polyuria.  Genitourinary:  Negative for dyspareunia, dysuria, flank pain, frequency and urgency.  Musculoskeletal:  Negative for arthralgias, back pain and myalgias.  Skin:  Negative for rash.  Allergic/Immunologic: Negative for environmental allergies.  Neurological:  Negative for dizziness, weakness and headaches.  Hematological:  Negative for adenopathy.  Psychiatric/Behavioral:  The patient is not nervous/anxious.        Objective     Today's Vitals   01/22/22 1616  BP: 101/66  Pulse: 84  Temp: 97.7 F (36.5 C)  TempSrc: Temporal  Weight: 166 lb (75.3 kg)  Height: 5' 2.5" (1.588 m)   Body mass index is 29.88 kg/m.   BP Readings from Last 3 Encounters:  01/22/22 101/66  12/23/21 95/61  11/20/21 95/61    Wt Readings from Last 3 Encounters:  01/22/22 166 lb (75.3 kg)  12/23/21 164 lb 6.4 oz (74.6 kg)  11/20/21 163 lb (73.9 kg)    Physical Exam Vitals and nursing note reviewed.  Constitutional:      Appearance: Normal appearance. She is well-developed.  HENT:     Head: Normocephalic and atraumatic.     Nose: Nose normal.     Mouth/Throat:  Mouth: Mucous membranes are moist.     Pharynx: Oropharynx is clear.  Eyes:     Extraocular Movements: Extraocular movements intact.     Conjunctiva/sclera: Conjunctivae normal.     Pupils: Pupils are equal, round, and reactive to light.  Cardiovascular:     Rate and Rhythm: Normal rate and regular rhythm.     Pulses: Normal pulses.     Heart sounds: Normal heart sounds.  Pulmonary:     Effort: Pulmonary effort is normal.     Breath sounds: Normal breath sounds.  Abdominal:      Palpations: Abdomen is soft.  Musculoskeletal:        General: Normal range of motion.     Cervical back: Normal range of motion and neck supple.  Lymphadenopathy:     Cervical: No cervical adenopathy.  Skin:    General: Skin is warm and dry.     Capillary Refill: Capillary refill takes less than 2 seconds.  Neurological:     General: No focal deficit present.     Mental Status: She is alert and oriented to person, place, and time.  Psychiatric:        Mood and Affect: Mood normal.        Behavior: Behavior normal.        Thought Content: Thought content normal.        Judgment: Judgment normal.      Assessment & Plan    1. Abnormal weight gain We will continue phentermine 30 mg capsules daily for additional 30 days. Discussed lowering calorie intake to 1500 calories per day. She should continue with regular exercise program. Reassess in one month.   2. Body mass index 29.0-29.9, adult We will continue phentermine 30 mg capsules daily for additional 30 days. Discussed lowering calorie intake to 1500 calories per day. She should continue with regular exercise program. Reassess in one month.  - phentermine 15 MG capsule; Take 1 capsule (15 mg total) by mouth in the morning and at bedtime.  Dispense: 60 capsule; Refill: 0   Problem List Items Addressed This Visit       Other   Abnormal weight gain - Primary   Body mass index 29.0-29.9, adult   Relevant Medications   phentermine 15 MG capsule     Return in about 4 weeks (around 02/19/2022) for routine - weight management.         Carlean Jews, NP  [facility] at [facility] [phone] (phone) [phone] (fax)  [facility]

## 2022-02-19 ENCOUNTER — Encounter: Payer: Self-pay | Admitting: Nurse Practitioner

## 2022-02-19 ENCOUNTER — Ambulatory Visit (INDEPENDENT_AMBULATORY_CARE_PROVIDER_SITE_OTHER): Payer: BC Managed Care – PPO | Admitting: Nurse Practitioner

## 2022-02-19 VITALS — BP 97/61 | HR 83 | Ht 62.5 in | Wt 166.8 lb

## 2022-02-19 DIAGNOSIS — Z6829 Body mass index (BMI) 29.0-29.9, adult: Secondary | ICD-10-CM

## 2022-02-19 DIAGNOSIS — R635 Abnormal weight gain: Secondary | ICD-10-CM

## 2022-02-19 MED ORDER — PHENTERMINE HCL 15 MG PO CAPS: 15.0000 mg | ORAL_CAPSULE | Freq: Two times a day (BID) | ORAL | 0 refills | Status: DC

## 2022-02-19 NOTE — Progress Notes (Signed)
Established patient visit   Patient: Allison Fischer   DOB: 1985-11-18   36 y.o. Female  MRN: [id number] Visit Date: 02/19/2022   Chief Complaint  Patient presents with   Weight Check   Subjective    HPI  The patient is here for follow up weight loss.  -restarted on phentermine after having extensive plastic surgery.  -she is now taking phentermine 30 mg daily  -restarted on 09/24/2021 -weight at start - 09/24/2021 - 159 -most recent weight - 01/22/2022- 168 -Today's weight 02/19/2022 - 166 pounds  --Weight change since last visit - 2 pounds  -Total weight change since restart - 7 pound weight gain -states that she is not taking the phentermine twice daily. She states that she is only taking it when she feels excessively hungry.  -states that she has not been in to the gym in two weeks. Work has been very demanding and she is very tired. She does have a physically demanding job.    Medications: Outpatient Medications Prior to Visit  Medication Sig   fluticasone (FLONASE) 50 MCG/ACT nasal spray Place 2 sprays into both nostrils daily.   [DISCONTINUED] phentermine 15 MG capsule Take 1 capsule (15 mg total) by mouth in the morning and at bedtime.   No facility-administered medications prior to visit.    Review of Systems  Constitutional:  Negative for activity change, appetite change, chills, fatigue and fever.       Two pound weight loss since last visit   HENT:  Negative for congestion, postnasal drip, rhinorrhea, sinus pressure, sinus pain, sneezing and sore throat.   Eyes: Negative.   Respiratory:  Negative for cough, chest tightness, shortness of breath and wheezing.   Cardiovascular:  Negative for chest pain and palpitations.  Gastrointestinal:  Negative for abdominal pain, constipation, diarrhea, nausea and vomiting.  Endocrine: Negative for cold intolerance, heat intolerance, polydipsia and polyuria.  Genitourinary:  Negative for dyspareunia, dysuria, flank pain, frequency and  urgency.  Musculoskeletal:  Negative for arthralgias, back pain and myalgias.  Skin:  Negative for rash.  Allergic/Immunologic: Negative for environmental allergies.  Neurological:  Negative for dizziness, weakness and headaches.  Hematological:  Negative for adenopathy.  Psychiatric/Behavioral:  The patient is not nervous/anxious.        Objective     Today's Vitals   02/19/22 1615  BP: 97/61  Pulse: 83  SpO2: 99%  Weight: 166 lb 12.8 oz (75.7 kg)  Height: 5' 2.5" (1.588 m)   Body mass index is 30.02 kg/m.   BP Readings from Last 3 Encounters:  02/19/22 97/61  01/22/22 101/66  12/23/21 95/61    Wt Readings from Last 3 Encounters:  02/19/22 166 lb 12.8 oz (75.7 kg)  01/22/22 166 lb (75.3 kg)  12/23/21 164 lb 6.4 oz (74.6 kg)    Physical Exam Vitals and nursing note reviewed.  Constitutional:      Appearance: Normal appearance. She is well-developed.  HENT:     Head: Normocephalic and atraumatic.     Nose: Nose normal.     Mouth/Throat:     Mouth: Mucous membranes are moist.     Pharynx: Oropharynx is clear.  Eyes:     Extraocular Movements: Extraocular movements intact.     Conjunctiva/sclera: Conjunctivae normal.     Pupils: Pupils are equal, round, and reactive to light.  Cardiovascular:     Rate and Rhythm: Normal rate and regular rhythm.     Pulses: Normal pulses.     Heart sounds: Normal  heart sounds.  Pulmonary:     Effort: Pulmonary effort is normal.     Breath sounds: Normal breath sounds.  Abdominal:     Palpations: Abdomen is soft.  Musculoskeletal:        General: Normal range of motion.     Cervical back: Normal range of motion and neck supple.  Lymphadenopathy:     Cervical: No cervical adenopathy.  Skin:    General: Skin is warm and dry.     Capillary Refill: Capillary refill takes less than 2 seconds.  Neurological:     General: No focal deficit present.     Mental Status: She is alert and oriented to person, place, and time.   Psychiatric:        Mood and Affect: Mood normal.        Behavior: Behavior normal.        Thought Content: Thought content normal.        Judgment: Judgment normal.      Assessment & Plan    1. Abnormal weight gain Improving. Two  pound weight loss since last visitl.   2. Body mass index 29.0-29.9, adult Improving. May continue phentermine 30 mg daily. Continue lowering calorie intake to 1500 calories per day and incorporating exercise into daily routine to help lose weight. Will monitor.  - phentermine 15 MG capsule; Take 1 capsule (15 mg total) by mouth in the morning and at bedtime.  Dispense: 60 capsule; Refill: 0   Problem List Items Addressed This Visit       Other   Abnormal weight gain - Primary   Body mass index 29.0-29.9, adult   Relevant Medications   phentermine 15 MG capsule     Return in about 6 weeks (around 04/02/2022) for routine - weight management.         Carlean Jews, NP  [facility] at [facility] [phone] (phone) [phone] (fax)  [facility]

## 2022-04-02 ENCOUNTER — Ambulatory Visit: Payer: BC Managed Care – PPO | Admitting: Nurse Practitioner

## 2022-04-09 ENCOUNTER — Ambulatory Visit (INDEPENDENT_AMBULATORY_CARE_PROVIDER_SITE_OTHER): Payer: BC Managed Care – PPO | Admitting: Nurse Practitioner

## 2022-04-09 ENCOUNTER — Encounter: Payer: Self-pay | Admitting: Nurse Practitioner

## 2022-04-09 VITALS — Ht 62.5 in | Wt 169.4 lb

## 2022-04-09 DIAGNOSIS — R635 Abnormal weight gain: Secondary | ICD-10-CM

## 2022-04-09 MED ORDER — PHENDIMETRAZINE TARTRATE ER 105 MG PO CP24: 1.0000 | ORAL_CAPSULE | Freq: Every day | ORAL | 0 refills | Status: DC

## 2022-04-09 NOTE — Progress Notes (Signed)
Established patient visit   Patient: Allison Fischer   DOB: [birth year]   36 y.o. Female  MRN: [id number] Visit Date: 04/09/2022  Virtual Visit via Telephone Note  I connected with Allison Fischer on 04/09/22 at  1:50 PM EDT by telephone and verified that I am speaking with the correct person using two identifiers.  Location: Patient: home  Provider: [employer] primary care at [facility]     I discussed the limitations, risks, security and privacy concerns of performing an evaluation and management service by telephone and the availability of in person appointments. I also discussed with the patient that there may be a patient responsible charge related to this service. The patient expressed understanding and agreed to proceed.   Chief Complaint  Patient presents with   Weight Check   Subjective    HPI  The patient is here for follow up weight loss.  -restarted on phentermine after having extensive plastic surgery.  -she is now taking phentermine 30 mg daily  -restarted on 09/24/2021 -weight at start - 09/24/2021 - 159 -most recent weight - 02/19/2022- 166 -Today's weight 04/09/2022 - 169 --Weight change since last visit - 3 pound weight gain  -Total weight change since restart - 10 pound weight gain -states that she is taking the phentermine twice daily.  -feels like the medication is not working as well as it was when it was first started.  -she is back to working out 4 to 5 days per week. Does spin classes and pump classes. She also has a physically demanding job.    Medications: Outpatient Medications Prior to Visit  Medication Sig   fluticasone (FLONASE) 50 MCG/ACT nasal spray Place 2 sprays into both nostrils daily.   [DISCONTINUED] phentermine 15 MG capsule Take 1 capsule (15 mg total) by mouth in the morning and at bedtime.   No facility-administered medications prior to visit.     Review of Systems  Constitutional:  Negative for chills, fever and malaise/fatigue.        Three pound weight gain since most recent visit   HENT:  Negative for congestion, sinus pain and sore throat.   Eyes: Negative.   Respiratory:  Negative for cough, shortness of breath and wheezing.   Cardiovascular:  Negative for chest pain, palpitations and leg swelling.  Gastrointestinal:  Negative for constipation, diarrhea, nausea and vomiting.  Genitourinary: Negative.   Musculoskeletal:  Negative for myalgias.  Skin: Negative.   Neurological:  Negative for dizziness and headaches.  Endo/Heme/Allergies:  Does not bruise/bleed easily.  Psychiatric/Behavioral:  Negative for depression. The patient is not nervous/anxious.        Objective     Today's Vitals   04/09/22 1314  Weight: 169 lb 6.4 oz (76.8 kg)  Height: 5' 2.5" (1.588 m)   Body mass index is 30.49 kg/m.  BP Readings from Last 3 Encounters:  02/19/22 97/61  01/22/22 101/66  12/23/21 95/61    Wt Readings from Last 3 Encounters:  04/09/22 169 lb 6.4 oz (76.8 kg)  02/19/22 166 lb 12.8 oz (75.7 kg)  01/22/22 166 lb (75.3 kg)      Assessment & Plan    1. Abnormal weight gain Change phentermine to phentermine 15 CR 105 mg daily.  Continue with low calorie, high-protein diet.  Continue with current exercise program.  Reassess in 1 month.     Return in about 4 weeks (around 05/07/2022) for routine - weight management.         I discussed  the assessment and treatment plan with the patient. The patient was provided an opportunity to ask questions and all were answered. The patient agreed with the plan and demonstrated an understanding of the instructions.    I provided 10 minutes of non-face-to-face time during this encounter.   Carlean Jews, NP  [facility] at [facility] [phone] (phone) [phone] (fax)  [facility]

## 2022-05-21 ENCOUNTER — Encounter: Payer: Self-pay | Admitting: Nurse Practitioner

## 2022-05-21 ENCOUNTER — Ambulatory Visit (INDEPENDENT_AMBULATORY_CARE_PROVIDER_SITE_OTHER): Payer: BC Managed Care – PPO | Admitting: Nurse Practitioner

## 2022-05-21 VITALS — BP 102/71 | HR 87 | Resp 18 | Ht 62.5 in | Wt 167.0 lb

## 2022-05-21 DIAGNOSIS — Z6830 Body mass index (BMI) 30.0-30.9, adult: Secondary | ICD-10-CM | POA: Diagnosis not present

## 2022-05-21 DIAGNOSIS — R635 Abnormal weight gain: Secondary | ICD-10-CM | POA: Diagnosis not present

## 2022-05-21 MED ORDER — PHENDIMETRAZINE TARTRATE ER 105 MG PO CP24: 1.0000 | ORAL_CAPSULE | Freq: Every day | ORAL | 1 refills | Status: DC

## 2022-05-21 MED ORDER — PHENDIMETRAZINE TARTRATE ER 105 MG PO CP24: 1.0000 | ORAL_CAPSULE | Freq: Every day | ORAL | 0 refills | Status: DC

## 2022-05-21 NOTE — Progress Notes (Signed)
Established patient visit   Patient: Allison Fischer   DOB: 1985-10-12   36 y.o. Female  MRN: [id number] Visit Date: 05/21/2022   Chief Complaint  Patient presents with   Weight Check   Subjective    HPI  The patient is here for follow up weight loss.  -restarted on phentermine after having extensive plastic surgery.  -switched to Bontril SR 105 mg daily  -she is now taking phentermine 30 mg daily  -restarted on 09/24/2021 -weight at start - 09/24/2021 - 159 -most recent weight - 04/09/2022 - 169 -Today's weight 05/21/2022 - 167 --Weight change since last visit - 2 pounds -Total weight change since restart - 8 pound weight gain.  -feels like the change in medication is working well. Controlling appetite better. -she is back to working out 4 to 5 days per week. Does spin classes and pump classes. She also has a physically demanding job.      Medications: Outpatient Medications Prior to Visit  Medication Sig   fluticasone (FLONASE) 50 MCG/ACT nasal spray Place 2 sprays into both nostrils daily.   [DISCONTINUED] Phendimetrazine Tartrate 105 MG CP24 Take 1 capsule (105 mg total) by mouth daily.   No facility-administered medications prior to visit.    Review of Systems  Constitutional:  Negative for activity change, appetite change, chills, fatigue and fever.       Two pound weight loss since most recent visit   HENT:  Negative for congestion, postnasal drip, rhinorrhea, sinus pressure, sinus pain, sneezing and sore throat.   Eyes: Negative.   Respiratory:  Negative for cough, chest tightness, shortness of breath and wheezing.   Cardiovascular:  Negative for chest pain and palpitations.  Gastrointestinal:  Negative for abdominal pain, constipation, diarrhea, nausea and vomiting.  Endocrine: Negative for cold intolerance, heat intolerance, polydipsia and polyuria.  Genitourinary:  Negative for dyspareunia, dysuria, flank pain, frequency and urgency.  Musculoskeletal:  Negative for  arthralgias, back pain and myalgias.  Skin:  Negative for rash.  Allergic/Immunologic: Negative for environmental allergies.  Neurological:  Negative for dizziness, weakness and headaches.  Hematological:  Negative for adenopathy.  Psychiatric/Behavioral:  The patient is not nervous/anxious.        Objective     Today's Vitals   05/21/22 1029  BP: 102/71  Pulse: 87  Resp: 18  SpO2: 100%  Weight: 167 lb (75.8 kg)  Height: 5' 2.5" (1.588 m)   Body mass index is 30.06 kg/m.  BP Readings from Last 3 Encounters:  05/21/22 102/71  02/19/22 97/61  01/22/22 101/66    Wt Readings from Last 3 Encounters:  05/21/22 167 lb (75.8 kg)  04/09/22 169 lb 6.4 oz (76.8 kg)  02/19/22 166 lb 12.8 oz (75.7 kg)    Physical Exam Vitals and nursing note reviewed.  Constitutional:      Appearance: Normal appearance. She is well-developed.  HENT:     Head: Normocephalic and atraumatic.  Eyes:     Pupils: Pupils are equal, round, and reactive to light.  Cardiovascular:     Rate and Rhythm: Normal rate and regular rhythm.     Pulses: Normal pulses.     Heart sounds: Normal heart sounds.  Pulmonary:     Effort: Pulmonary effort is normal.     Breath sounds: Normal breath sounds.  Abdominal:     Palpations: Abdomen is soft.  Musculoskeletal:        General: Normal range of motion.     Cervical back: Normal range of motion  and neck supple.  Lymphadenopathy:     Cervical: No cervical adenopathy.  Skin:    General: Skin is warm and dry.     Capillary Refill: Capillary refill takes less than 2 seconds.  Neurological:     General: No focal deficit present.     Mental Status: She is alert and oriented to person, place, and time.  Psychiatric:        Mood and Affect: Mood normal.        Behavior: Behavior normal.        Thought Content: Thought content normal.        Judgment: Judgment normal.      Assessment & Plan    1. Abnormal weight gain Improving with change of appetite  suppressant from phentermine to Bontril SR.   2. BMI 30.0-30.9,adult Continue Bontril SR for additional 30 days. Continue with low calorie and high protein diet. Exercise daily when possible.  - Phendimetrazine Tartrate 105 MG CP24; Take 1 capsule (105 mg total) by mouth daily.  Dispense: 30 capsule; Refill: 1   Problem List Items Addressed This Visit       Other   Abnormal weight gain   Other Visit Diagnoses     BMI 30.0-30.9,adult    -  Primary   Relevant Medications   Phendimetrazine Tartrate 105 MG CP24        Return in about 4 weeks (around 06/18/2022) for routine - weight management.         Carlean Jews, NP  [facility] at [facility] [phone] (phone) [phone] (fax)  [facility]

## 2022-06-17 ENCOUNTER — Ambulatory Visit
Admission: EM | Admit: 2022-06-17 | Discharge: 2022-06-17 | Disposition: A | Discharge status: Home / Self Care | Payer: BC Managed Care – PPO | Attending: Family Medicine | Admitting: Family Medicine

## 2022-06-17 DIAGNOSIS — J111 Influenza due to unidentified influenza virus with other respiratory manifestations: Secondary | ICD-10-CM | POA: Diagnosis not present

## 2022-06-17 MED ORDER — OSELTAMIVIR PHOSPHATE 75 MG PO CAPS: 75.0000 mg | ORAL_CAPSULE | Freq: Two times a day (BID) | ORAL | 0 refills | Status: AC

## 2022-06-17 MED ORDER — PROMETHAZINE-DM 6.25-15 MG/5ML PO SYRP: 5.0000 mL | ORAL_SOLUTION | Freq: Four times a day (QID) | ORAL | 0 refills | Status: DC | PRN

## 2022-06-17 NOTE — ED Provider Notes (Signed)
[facility]   [id number] 06/17/22 Arrival Time: 1542  ASSESSMENT & PLAN:  1. Influenza    Discussed typical duration of suspected influenza. OTC symptom care as needed.  Discharge Medication List as of 06/17/2022  5:29 PM     START taking these medications   Details  oseltamivir (TAMIFLU) 75 MG capsule Take 1 capsule (75 mg total) by mouth 2 (two) times daily for 5 days., Starting Tue 06/17/2022, Until Sun 06/22/2022, Normal    promethazine-dextromethorphan (PROMETHAZINE-DM) 6.25-15 MG/5ML syrup Take 5 mLs by mouth 4 (four) times daily as needed for cough., Starting Tue 06/17/2022, Normal         Follow-up Information     Carlean Jews, NP.   Specialty: Family Medicine Why: If worsening or failing to improve as anticipated. Contact information: [address] [place] [address] [phone]                 Reviewed expectations re: course of current medical issues. Questions answered. Outlined signs and symptoms indicating need for more acute intervention. Understanding verbalized. After Visit Summary given.   SUBJECTIVE: History from: Patient. Allison Fischer is a 36 y.o. female. [facility] of her sons testing pos for the on the 19th, pt reports symptoms for her started yesterday. Pt is having fatigue, cp, congestion, hoarseness, loose stools. Pt has taken musinex, and theraflu  No SOB. Normal PO intake. OBJECTIVE:  Vitals:   06/17/22 1710  BP: 131/83  Pulse: 96  Resp: 16  Temp: 98 F (36.7 C)  TempSrc: Oral  SpO2: 98%    General appearance: alert; no distress; fatigued-appearing Eyes: PERRLA; EOMI; conjunctiva normal HENT: [place]; AT; with nasal congestion Neck: supple  Lungs: speaks full sentences without difficulty; unlabored; ctab Extremities: no edema Skin: warm and dry Neurologic: normal gait Psychological: alert and cooperative; normal mood and affect    Allergies  Allergen Reactions    Penicillins Rash    Has patient had a PCN reaction causing immediate rash, facial/tongue/throat swelling, SOB or lightheadedness with hypotension: Yes Has patient had a PCN reaction causing severe rash involving mucus membranes or skin necrosis: No Has patient had a PCN reaction that required hospitalization No Has patient had a PCN reaction occurring within the last 10 years: No If all of the above answers are "NO", then may proceed with Cephalosporin use.    Tape Rash    Adnesive= itching and bumps    Past Medical History:  Diagnosis Date   Allergy    Medical history non-contributory    Social History   Socioeconomic History   Marital status: Married    Spouse name: Not on file   Number of children: Not on file   Years of education: Not on file   Highest education level: Not on file  Occupational History   Not on file  Tobacco Use   Smoking status: Never   Smokeless tobacco: Never  Vaping Use   Vaping Use: Never used  Substance and Sexual Activity   Alcohol use: Yes    Alcohol/week: 0.0 standard drinks of alcohol    Comment: occ weekends   Drug use: No   Sexual activity: Yes  Other Topics Concern   Not on file  Social History Narrative   Not on file   Social Determinants of Health   Financial Resource Strain: Not on file  Food Insecurity: Not on file  Transportation Needs: Not on file  Physical Activity: Not on  file  Stress: Not on file  Social Connections: Not on file  Intimate Partner Violence: Not on file   Family History  Problem Relation Age of Onset   High Cholesterol Mother    High blood pressure Father    Hypertension Maternal Grandmother    Cancer Maternal Grandmother    Heart attack Maternal Grandfather    High blood pressure Maternal Grandfather    High blood pressure Paternal Grandmother    Heart attack Paternal Grandfather    High blood pressure Paternal Grandfather    Past Surgical History:  Procedure Laterality Date   APPENDECTOMY N/A     [employer] 09/24/2020   COSMETIC SURGERY     EYE SURGERY Bilateral 12/2007   cornea sx to correct vision   LAPAROSCOPIC APPENDECTOMY N/A 04/21/2017   Procedure: APPENDECTOMY LAPAROSCOPIC;  Surgeon: Rodman Pickle, MD;  Location: [facility] OR;  Service: General;  Laterality: N/A;   LAPAROSCOPIC TUBAL LIGATION Bilateral 03/23/2020   Procedure: LAPAROSCOPIC TUBAL LIGATION with filshie clips;  Surgeon: Mitchel Honour, DO;  Location: [facility];  Service: Gynecology;  Laterality: Bilateral;   TUBAL LIGATION N/A    [employer]rees[employer] 09/24/2020     Mardella Layman, MD 06/17/22 1807

## 2022-06-17 NOTE — ED Triage Notes (Signed)
[facility] to [facility] with [facility] of her sons testing pos for the on the 19th, pt reports symptoms for her started yesterday. Pt is having fatigue, cp, congestion, hoarseness, loose stools. Pt has taken musinex, and theraflu

## 2022-06-30 ENCOUNTER — Ambulatory Visit: Payer: BC Managed Care – PPO | Admitting: Nurse Practitioner

## 2022-06-30 ENCOUNTER — Encounter: Payer: Self-pay | Admitting: Nurse Practitioner

## 2022-06-30 VITALS — BP 94/62 | HR 80 | Ht 62.5 in | Wt 168.1 lb

## 2022-06-30 DIAGNOSIS — Z6830 Body mass index (BMI) 30.0-30.9, adult: Secondary | ICD-10-CM

## 2022-06-30 DIAGNOSIS — R5383 Other fatigue: Secondary | ICD-10-CM | POA: Diagnosis not present

## 2022-06-30 MED ORDER — PHENTERMINE HCL 37.5 MG PO TABS: 37.5000 mg | ORAL_TABLET | Freq: Every day | ORAL | 1 refills | Status: DC

## 2022-06-30 NOTE — Progress Notes (Signed)
Established patient visit   Patient: Allison Fischer   DOB: 20-Feb-1986   37 y.o. Female  MRN: [id number] Visit Date: 06/30/2022   Chief Complaint  Patient presents with   Weight Check   Subjective    HPI  The patient is here for follow up weight loss.  -restarted on phentermine after having extensive plastic surgery.  -she is now taking phentermine 30 mg daily  -restarted on 09/24/2021 -weight at start - 09/24/2021 - 159 -most recent weight - 05/21/2022 - 167 -Today's weight 05/21/2022 - 168 --Weight change since last visit - 1 pound gain  -Total weight change since restart - 11 pound weight gain    Medications: Outpatient Medications Prior to Visit  Medication Sig   fluticasone (FLONASE) 50 MCG/ACT nasal spray Place 2 sprays into both nostrils daily.   [DISCONTINUED] Phendimetrazine Tartrate 105 MG CP24 Take 1 capsule (105 mg total) by mouth daily.   [DISCONTINUED] promethazine-dextromethorphan (PROMETHAZINE-DM) 6.25-15 MG/5ML syrup Take 5 mLs by mouth 4 (four) times daily as needed for cough.   No facility-administered medications prior to visit.    Review of Systems  Constitutional:  Negative for activity change, appetite change, chills, fatigue and fever.       One pound weight gain since last visit. Gradual weight gain since having extensive reconstructive surgery.   HENT:  Negative for congestion, postnasal drip, rhinorrhea, sinus pressure, sinus pain, sneezing and sore throat.   Eyes: Negative.   Respiratory:  Negative for cough, chest tightness, shortness of breath and wheezing.   Cardiovascular:  Negative for chest pain and palpitations.  Gastrointestinal:  Negative for abdominal pain, constipation, diarrhea, nausea and vomiting.  Endocrine: Negative for cold intolerance, heat intolerance, polydipsia and polyuria.  Genitourinary:  Negative for dyspareunia, dysuria, flank pain, frequency and urgency.  Musculoskeletal:  Negative for arthralgias, back pain and myalgias.   Skin:  Negative for rash.  Allergic/Immunologic: Negative for environmental allergies.  Neurological:  Negative for dizziness, weakness and headaches.  Hematological:  Negative for adenopathy.  Psychiatric/Behavioral:  The patient is not nervous/anxious.       Objective     Today's Vitals   06/30/22 1602  BP: 94/62  Pulse: 80  SpO2: 99%  Weight: 168 lb 1.9 oz (76.3 kg)  Height: 5' 2.5" (1.588 m)   Body mass index is 30.26 kg/m.  BP Readings from Last 3 Encounters:  06/30/22 94/62  06/17/22 131/83  05/21/22 102/71    Wt Readings from Last 3 Encounters:  06/30/22 168 lb 1.9 oz (76.3 kg)  05/21/22 167 lb (75.8 kg)  04/09/22 169 lb 6.4 oz (76.8 kg)    Physical Exam Vitals and nursing note reviewed.  Constitutional:      Appearance: Normal appearance. She is well-developed.  HENT:     Head: Normocephalic and atraumatic.     Mouth/Throat:     Mouth: Mucous membranes are moist.     Pharynx: Oropharynx is clear.  Eyes:     Extraocular Movements: Extraocular movements intact.     Conjunctiva/sclera: Conjunctivae normal.     Pupils: Pupils are equal, round, and reactive to light.  Cardiovascular:     Rate and Rhythm: Normal rate and regular rhythm.     Pulses: Normal pulses.     Heart sounds: Normal heart sounds.  Pulmonary:     Effort: Pulmonary effort is normal.     Breath sounds: Normal breath sounds.  Abdominal:     Palpations: Abdomen is soft.  Musculoskeletal:  General: Normal range of motion.     Cervical back: Normal range of motion and neck supple.  Lymphadenopathy:     Cervical: No cervical adenopathy.  Skin:    General: Skin is warm and dry.     Capillary Refill: Capillary refill takes less than 2 seconds.  Neurological:     General: No focal deficit present.     Mental Status: She is alert and oriented to person, place, and time.  Psychiatric:        Mood and Affect: Mood normal.        Behavior: Behavior normal.        Thought Content:  Thought content normal.        Judgment: Judgment normal.       Assessment & Plan    .1. Other fatigue Likely from busy work schedule  and family stress.  Will monitor.   2. BMI 30.0-30.9,adult Increase phentermine to 37.5 mg tablets daily. Continue with low calorie, high-protein diet. Incorporate exercise into daily activities.   - phentermine (ADIPEX-P) 37.5 MG tablet; Take 1 tablet (37.5 mg total) by mouth daily before breakfast.  Dispense: 30 tablet; Refill: 1    Problem List Items Addressed This Visit       Other   BMI 30.0-30.9,adult   Relevant Medications   phentermine (ADIPEX-P) 37.5 MG tablet   Other fatigue - Primary     Return in about 2 months (around 08/29/2022) for health maintenance exam, FBW a week prior to visit.         Carlean Jews, NP  [facility] at [facility] [phone] (phone) [phone] (fax)  [facility]

## 2022-08-22 ENCOUNTER — Other Ambulatory Visit: Payer: Self-pay

## 2022-08-22 DIAGNOSIS — Z130 Encounter for screening for diseases of the blood and blood-forming organs and certain disorders involving the immune mechanism: Secondary | ICD-10-CM

## 2022-08-22 DIAGNOSIS — Z0000 Encounter for general adult medical examination without abnormal findings: Secondary | ICD-10-CM

## 2022-08-25 ENCOUNTER — Other Ambulatory Visit: Payer: BC Managed Care – PPO

## 2022-08-25 DIAGNOSIS — Z0000 Encounter for general adult medical examination without abnormal findings: Secondary | ICD-10-CM

## 2022-08-25 DIAGNOSIS — Z130 Encounter for screening for diseases of the blood and blood-forming organs and certain disorders involving the immune mechanism: Secondary | ICD-10-CM

## 2022-08-27 LAB — COMPREHENSIVE METABOLIC PANEL
ALT: 11 IU/L (ref 0–32)
AST: 14 IU/L (ref 0–40)
Albumin/Globulin Ratio: 2 (ref 1.2–2.2)
Albumin: 4.1 g/dL (ref 3.9–4.9)
Alkaline Phosphatase: 69 IU/L (ref 44–121)
BUN/Creatinine Ratio: 29 — ABNORMAL HIGH (ref 9–23)
BUN: 19 mg/dL (ref 6–20)
Bilirubin Total: 0.6 mg/dL (ref 0.0–1.2)
CO2: 21 mmol/L (ref 20–29)
Calcium: 9.1 mg/dL (ref 8.7–10.2)
Chloride: 107 mmol/L — ABNORMAL HIGH (ref 96–106)
Creatinine, Ser: 0.66 mg/dL (ref 0.57–1.00)
Globulin, Total: 2.1 g/dL (ref 1.5–4.5)
Glucose: 91 mg/dL (ref 70–99)
Potassium: 5 mmol/L (ref 3.5–5.2)
Sodium: 141 mmol/L (ref 134–144)
Total Protein: 6.2 g/dL (ref 6.0–8.5)
eGFR: 117 mL/min/{1.73_m2} (ref >59)

## 2022-08-27 LAB — CBC
Hematocrit: 39.6 % (ref 34.0–46.6)
Hemoglobin: 13.4 g/dL (ref 11.1–15.9)
MCH: 29.4 pg (ref 26.6–33.0)
MCHC: 33.8 g/dL (ref 31.5–35.7)
MCV: 87 fL (ref 79–97)
Platelets: 298 10*3/uL (ref 150–450)
RBC: 4.56 x10E6/uL (ref 3.77–5.28)
RDW: 11.9 % (ref 11.7–15.4)
WBC: 4.4 10*3/uL (ref 3.4–10.8)

## 2022-08-27 LAB — LIPID PANEL
Chol/HDL Ratio: 2.9 ratio (ref 0.0–4.4)
Cholesterol, Total: 174 mg/dL (ref 100–199)
HDL: 61 mg/dL (ref >39)
LDL Chol Calc (NIH): 96 mg/dL (ref 0–99)
Triglycerides: 93 mg/dL (ref 0–149)
VLDL Cholesterol Cal: 17 mg/dL (ref 5–40)

## 2022-08-27 LAB — HEMOGLOBIN A1C
Est. average glucose Bld gHb Est-mCnc: 114 mg/dL
Hgb A1c MFr Bld: 5.6 % (ref 4.8–5.6)

## 2022-08-27 LAB — TSH: TSH: 0.701 u[IU]/mL (ref 0.450–4.500)

## 2022-09-02 ENCOUNTER — Ambulatory Visit: Payer: BC Managed Care – PPO | Admitting: Nurse Practitioner

## 2022-09-02 ENCOUNTER — Encounter: Payer: Self-pay | Admitting: Nurse Practitioner

## 2022-09-02 VITALS — BP 104/69 | HR 84 | Ht 62.5 in | Wt 167.4 lb

## 2022-09-02 DIAGNOSIS — Z6830 Body mass index (BMI) 30.0-30.9, adult: Secondary | ICD-10-CM | POA: Diagnosis not present

## 2022-09-02 DIAGNOSIS — R5383 Other fatigue: Secondary | ICD-10-CM | POA: Diagnosis not present

## 2022-09-02 MED ORDER — PHENTERMINE HCL 37.5 MG PO TABS: 37.5000 mg | ORAL_TABLET | Freq: Every day | ORAL | 2 refills | Status: DC

## 2022-09-02 NOTE — Progress Notes (Signed)
Established patient visit   Patient: Allison Fischer   DOB: 1985-08-04   37 y.o. Female  MRN: [id number] Visit Date: 09/02/2022   Chief Complaint  Patient presents with   Weight Check   Subjective    HPI  The patient is here for follow up weight loss.  -restarted on phentermine after having extensive plastic surgery.  -she is now taking phentermine 30 mg daily  -restarted on 09/24/2021 -weight at start - 09/24/2021 - 159 -most recent weight - 06/30/2022 - 168 -Today's weight * 09/01/2022 - 167 --Weight change since last visit - 1 pound loss -Total weight change since restart - 10 pound weight gain  - has started a new position at work where she is not as active as she was in the past  - has gone back to working out more often after taking a break from this for a month prior  - did have routine, fasting labs done prior to this visit.  --labs were all essentially within normal limits.   Medications: Outpatient Medications Prior to Visit  Medication Sig   fluticasone (FLONASE) 50 MCG/ACT nasal spray Place 2 sprays into both nostrils daily.   [DISCONTINUED] phentermine (ADIPEX-P) 37.5 MG tablet Take 1 tablet (37.5 mg total) by mouth daily before breakfast.   No facility-administered medications prior to visit.    Review of Systems See HPI    Last CBC Lab Results  Component Value Date   WBC 4.4 08/25/2022   HGB 13.4 08/25/2022   HCT 39.6 08/25/2022   MCV 87 08/25/2022   MCH 29.4 08/25/2022   RDW 11.9 08/25/2022   PLT 298 08/25/2022   Last metabolic panel Lab Results  Component Value Date   GLUCOSE 91 08/25/2022   NA 141 08/25/2022   K 5.0 08/25/2022   CL 107 (H) 08/25/2022   CO2 21 08/25/2022   BUN 19 08/25/2022   CREATININE 0.66 08/25/2022   EGFR 117 08/25/2022   CALCIUM 9.1 08/25/2022   PROT 6.2 08/25/2022   ALBUMIN 4.1 08/25/2022   LABGLOB 2.1 08/25/2022   AGRATIO 2.0 08/25/2022   BILITOT 0.6 08/25/2022   ALKPHOS 69 08/25/2022   AST 14 08/25/2022   ALT 11  08/25/2022   ANIONGAP 8 08/29/2021   Last lipids Lab Results  Component Value Date   CHOL 174 08/25/2022   HDL 61 08/25/2022   LDLCALC 96 08/25/2022   TRIG 93 08/25/2022   CHOLHDL 2.9 08/25/2022   Last hemoglobin A1c Lab Results  Component Value Date   HGBA1C 5.6 08/25/2022   Last thyroid functions Lab Results  Component Value Date   TSH 0.701 08/25/2022   T3TOTAL 113 11/01/2020       Objective     Today's Vitals   09/02/22 1604  BP: 104/69  Pulse: 84  SpO2: 100%  Weight: 167 lb 6.4 oz (75.9 kg)  Height: 5' 2.5" (1.588 m)   Body mass index is 30.13 kg/m.  BP Readings from Last 3 Encounters:  09/02/22 104/69  06/30/22 94/62  06/17/22 131/83    Wt Readings from Last 3 Encounters:  09/02/22 167 lb 6.4 oz (75.9 kg)  06/30/22 168 lb 1.9 oz (76.3 kg)  05/21/22 167 lb (75.8 kg)    Physical Exam Vitals and nursing note reviewed.  Constitutional:      Appearance: Normal appearance. She is well-developed.  HENT:     Head: Normocephalic and atraumatic.     Nose: Nose normal.     Mouth/Throat:  Mouth: Mucous membranes are moist.     Pharynx: Oropharynx is clear.  Eyes:     Extraocular Movements: Extraocular movements intact.     Conjunctiva/sclera: Conjunctivae normal.     Pupils: Pupils are equal, round, and reactive to light.  Cardiovascular:     Rate and Rhythm: Normal rate and regular rhythm.     Pulses: Normal pulses.     Heart sounds: Normal heart sounds.  Pulmonary:     Effort: Pulmonary effort is normal.     Breath sounds: Normal breath sounds.  Abdominal:     Palpations: Abdomen is soft.  Musculoskeletal:        General: Normal range of motion.     Cervical back: Normal range of motion and neck supple.  Lymphadenopathy:     Cervical: No cervical adenopathy.  Skin:    General: Skin is warm and dry.     Capillary Refill: Capillary refill takes less than 2 seconds.  Neurological:     General: No focal deficit present.     Mental Status:  She is alert and oriented to person, place, and time.  Psychiatric:        Mood and Affect: Mood normal.        Behavior: Behavior normal.        Thought Content: Thought content normal.        Judgment: Judgment normal.      Assessment & Plan    Other fatigue Assessment & Plan: Likely due to busy work and family life. Continue to monitor .   BMI 30.0-30.9,adult Assessment & Plan: Continue phentermine daily. Continue with low calorie, high-protein diet. Incorporate exercise into daily activities.    Orders: -     Phentermine HCl; Take 1 tablet (37.5 mg total) by mouth daily before breakfast.  Dispense: 30 tablet; Refill: 2        Return for as scheduled.         Carlean Jews, NP  [facility] at [facility] [phone] (phone) [phone] (fax)  [facility]

## 2022-10-11 NOTE — Assessment & Plan Note (Signed)
Likely due to busy work and family life. Continue to monitor .

## 2022-10-11 NOTE — Assessment & Plan Note (Signed)
Continue phentermine daily. Continue with low calorie, high-protein diet. Incorporate exercise into daily activities.

## 2022-11-04 ENCOUNTER — Encounter (HOSPITAL_COMMUNITY): Payer: Self-pay

## 2022-11-04 ENCOUNTER — Other Ambulatory Visit: Payer: Self-pay

## 2022-11-04 ENCOUNTER — Emergency Department (HOSPITAL_COMMUNITY): Payer: BC Managed Care – PPO

## 2022-11-04 ENCOUNTER — Emergency Department (HOSPITAL_COMMUNITY)
Admission: EM | Admit: 2022-11-04 | Discharge: 2022-11-04 | Disposition: A | Discharge status: Home / Self Care | Payer: BC Managed Care – PPO | Attending: Emergency Medicine | Admitting: Emergency Medicine

## 2022-11-04 DIAGNOSIS — S0181XA Laceration without foreign body of other part of head, initial encounter: Secondary | ICD-10-CM | POA: Insufficient documentation

## 2022-11-04 DIAGNOSIS — Y92410 Unspecified street and highway as the place of occurrence of the external cause: Secondary | ICD-10-CM | POA: Diagnosis not present

## 2022-11-04 DIAGNOSIS — S161XXA Strain of muscle, fascia and tendon at neck level, initial encounter: Secondary | ICD-10-CM | POA: Insufficient documentation

## 2022-11-04 DIAGNOSIS — S0993XA Unspecified injury of face, initial encounter: Secondary | ICD-10-CM | POA: Diagnosis present

## 2022-11-04 MED ORDER — METHOCARBAMOL 500 MG PO TABS: 500.0000 mg | ORAL_TABLET | Freq: Two times a day (BID) | ORAL | 0 refills | Status: DC | PRN

## 2022-11-04 MED ORDER — ACETAMINOPHEN 500 MG PO TABS
1000.0000 mg | ORAL_TABLET | Freq: Once | ORAL | Status: DC
  Filled 2022-11-04: qty 2

## 2022-11-04 MED ORDER — IBUPROFEN 600 MG PO TABS: 600.0000 mg | ORAL_TABLET | Freq: Four times a day (QID) | ORAL | 0 refills | Status: DC | PRN
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
## 2022-11-04 NOTE — ED Notes (Signed)
Laceration noted to patients forehead.
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
## 2022-11-05 NOTE — ED Notes (Signed)
Pt provided with AVS.  Education complete; all questions answered.  Pt leaving ED in stable condition at this time, ambulatory with all belongings. 

## 2022-11-30 NOTE — Progress Notes (Unsigned)
Complete physical exam   Patient: Allison Fischer   DOB: [birth year]   36 y.o. Female  MRN: [id number] Visit Date: 12/01/2022    No chief complaint on file.  Subjective    Allison Fischer is a 37 y.o. female who presents today for a complete physical exam.  She reports consuming a {diet types:17450} diet. {Exercise:19826} She generally feels {well/fairly well/poorly:18703}. She {does/does not:200015} have additional problems to discuss today.   HPI  Annual physical  -difficulty with weight loss.  -restarted on phentermine after having extensive plastic surgery.  -she is now taking phentermine 37.5 mg daily  -restarted on 09/24/2021 -weight at start - 09/24/2021 - 159 -most recent weight - 09/01/2022  - 167 -Today's weight 12/01/2022 -  --Weight change since last visit - 1 pound loss -Total weight change since restart - 10 pound weight gain  - has started a new position at work where she is not as active as she was in the past  - has gone back to working out more often after taking a break from this for a month prior  - did have routine, fasting labs done prior to this visit.  --labs were all essentially within normal limits.    Past Medical History:  Diagnosis Date   Allergy    Medical history non-contributory    Past Surgical History:  Procedure Laterality Date   APPENDECTOMY N/A    Phreesia 09/24/2020   COSMETIC SURGERY     EYE SURGERY Bilateral 12/2007   cornea sx to correct vision   LAPAROSCOPIC APPENDECTOMY N/A 04/21/2017   Procedure: APPENDECTOMY LAPAROSCOPIC;  Surgeon: Kinsinger, De Blanch, MD;  Location: [facility] OR;  Service: General;  Laterality: N/A;   LAPAROSCOPIC TUBAL LIGATION Bilateral 03/23/2020   Procedure: LAPAROSCOPIC TUBAL LIGATION with filshie clips;  Surgeon: Mitchel Honour, DO;  Location: [facility];  Service: Gynecology;  Laterality: Bilateral;   TUBAL LIGATION N/A    Phreesia 09/24/2020   Social History   Socioeconomic History   Marital status:  Married    Spouse name: Not on file   Number of children: Not on file   Years of education: Not on file   Highest education level: Not on file  Occupational History   Not on file  Tobacco Use   Smoking status: Never   Smokeless tobacco: Never  Vaping Use   Vaping Use: Never used  Substance and Sexual Activity   Alcohol use: Yes    Alcohol/week: 0.0 standard drinks of alcohol    Comment: occ weekends   Drug use: No   Sexual activity: Yes  Other Topics Concern   Not on file  Social History Narrative   Not on file   Social Determinants of Health   Financial Resource Strain: Not on file  Food Insecurity: Not on file  Transportation Needs: Not on file  Physical Activity: Not on file  Stress: Not on file  Social Connections: Not on file  Intimate Partner Violence: Not on file   Family Status  Relation Name Status   Mother  Alive   Father  Alive   MGM  Alive   MGF  Deceased   PGM  Deceased   PGF  Deceased   Family History  Problem Relation Age of Onset   High Cholesterol Mother    High blood pressure Father    Hypertension Maternal Grandmother    Cancer Maternal Grandmother    Heart attack Maternal Grandfather    High blood pressure Maternal Grandfather  High blood pressure Paternal Grandmother    Heart attack Paternal Grandfather    High blood pressure Paternal Grandfather    Allergies  Allergen Reactions   Penicillins Rash    Has patient had a PCN reaction causing immediate rash, facial/tongue/throat swelling, SOB or lightheadedness with hypotension: Yes Has patient had a PCN reaction causing severe rash involving mucus membranes or skin necrosis: No Has patient had a PCN reaction that required hospitalization No Has patient had a PCN reaction occurring within the last 10 years: No If all of the above answers are "NO", then may proceed with Cephalosporin use.    Tape Rash    Adnesive= itching and bumps    Patient Care Team: Carlean Jews, NP as PCP  - General (Family Medicine)   Medications: Outpatient Medications Prior to Visit  Medication Sig   fluticasone (FLONASE) 50 MCG/ACT nasal spray Place 2 sprays into both nostrils daily.   ibuprofen (ADVIL) 600 MG tablet Take 1 tablet (600 mg total) by mouth every 6 (six) hours as needed.   methocarbamol (ROBAXIN) 500 MG tablet Take 1 tablet (500 mg total) by mouth 2 (two) times daily as needed for muscle spasms.   phentermine (ADIPEX-P) 37.5 MG tablet Take 1 tablet (37.5 mg total) by mouth daily before breakfast.   No facility-administered medications prior to visit.    Review of Systems  Last CBC Lab Results  Component Value Date   WBC 4.4 08/25/2022   HGB 13.4 08/25/2022   HCT 39.6 08/25/2022   MCV 87 08/25/2022   MCH 29.4 08/25/2022   RDW 11.9 08/25/2022   PLT 298 08/25/2022   Last metabolic panel Lab Results  Component Value Date   GLUCOSE 91 08/25/2022   NA 141 08/25/2022   K 5.0 08/25/2022   CL 107 (H) 08/25/2022   CO2 21 08/25/2022   BUN 19 08/25/2022   CREATININE 0.66 08/25/2022   EGFR 117 08/25/2022   CALCIUM 9.1 08/25/2022   PROT 6.2 08/25/2022   ALBUMIN 4.1 08/25/2022   LABGLOB 2.1 08/25/2022   AGRATIO 2.0 08/25/2022   BILITOT 0.6 08/25/2022   ALKPHOS 69 08/25/2022   AST 14 08/25/2022   ALT 11 08/25/2022   ANIONGAP 8 08/29/2021   Last lipids Lab Results  Component Value Date   CHOL 174 08/25/2022   HDL 61 08/25/2022   LDLCALC 96 08/25/2022   TRIG 93 08/25/2022   CHOLHDL 2.9 08/25/2022   Last hemoglobin A1c Lab Results  Component Value Date   HGBA1C 5.6 08/25/2022   Last thyroid functions Lab Results  Component Value Date   TSH 0.701 08/25/2022   T3TOTAL 113 11/01/2020        Objective    There were no vitals filed for this visit. There is no height or weight on file to calculate BMI.  BP Readings from Last 3 Encounters:  11/04/22 114/80  09/02/22 104/69  06/30/22 94/62    Wt Readings from Last 3 Encounters:  11/04/22 173 lb  (78.5 kg)  09/02/22 167 lb 6.4 oz (75.9 kg)  06/30/22 168 lb 1.9 oz (76.3 kg)     Physical Exam  ***  Last depression screening scores   Row Labels 06/30/2022    4:04 PM 04/09/2022    1:15 PM 01/22/2022    4:20 PM  PHQ 2/9 Scores   Section Header. No data exists in this row.     PHQ - 2 Score   0 0 0  PHQ- 9 Score  2 0 0   Last fall risk screening   Row Labels 01/22/2022    4:19 PM  Fall Risk    Section Header. No data exists in this row.   Falls in the past year?   0  Number falls in past yr:   0  Injury with Fall?   0  Risk for fall due to :   No Fall Risks  Follow up   Falls evaluation completed   Last Audit-C alcohol use screening   No data to display    A score of 3 or more in women, and 4 or more in men indicates increased risk for alcohol abuse, EXCEPT if all of the points are from question 1   No results found for any visits on 12/01/22.  Assessment & Plan    Routine Health Maintenance and Physical Exam  Exercise Activities and Dietary recommendations  Goals   None     Immunization History  Administered Date(s) Administered   Hepatitis A, Adult 06/06/2014   Influenza Inj Mdck Quad Pf 06/05/2017   Influenza, Seasonal, Injecte, Preservative Fre 04/24/2022   Influenza,inj,Quad PF,6+ Mos 03/06/2014, 04/07/2016, 04/04/2021   Influenza-Unspecified 04/16/2021   PFIZER(Purple Top)SARS-COV-2 Vaccination 08/30/2019, 09/30/2019, 10/18/2020, 04/24/2022   Pfizer Covid-19 Vaccine Bivalent Booster 23yrs & up 04/16/2021   Tdap 08/16/2018    Health Maintenance  Topic Date Due   COVID-19 Vaccine (6 - 2023-24 season) 06/19/2022   PAP SMEAR-Modifier  01/20/2023   INFLUENZA VACCINE  01/22/2023   DTaP/Tdap/Td (2 - Td or Tdap) 08/16/2028   HIV Screening  Completed   HPV VACCINES  Aged Out   Hepatitis C Screening  Discontinued    Discussed health benefits of physical activity, and encouraged her to engage in regular exercise appropriate for her age and  condition.  There are no diagnoses linked to this encounter.  No follow-ups on file.        Carlean Jews, NP  [facility] at [facility] [phone] (phone) [phone] (fax)  [facility]

## 2022-12-01 ENCOUNTER — Ambulatory Visit (INDEPENDENT_AMBULATORY_CARE_PROVIDER_SITE_OTHER): Payer: BC Managed Care – PPO | Admitting: Nurse Practitioner

## 2022-12-01 ENCOUNTER — Telehealth: Payer: Self-pay

## 2022-12-01 ENCOUNTER — Encounter: Payer: Self-pay | Admitting: Nurse Practitioner

## 2022-12-01 VITALS — BP 101/68 | HR 72 | Ht 62.5 in | Wt 177.1 lb

## 2022-12-01 DIAGNOSIS — Z0001 Encounter for general adult medical examination with abnormal findings: Secondary | ICD-10-CM | POA: Diagnosis not present

## 2022-12-01 DIAGNOSIS — Z6831 Body mass index (BMI) 31.0-31.9, adult: Secondary | ICD-10-CM

## 2022-12-01 DIAGNOSIS — R635 Abnormal weight gain: Secondary | ICD-10-CM

## 2022-12-01 NOTE — Telephone Encounter (Signed)
Pt is calling to telling to say that the insurance doesn't cover any weight loss.   Pt is wanting to know what she should do. Good Rx price is very expensive too

## 2022-12-01 NOTE — Telephone Encounter (Signed)
I can do a referral to [facility] if she wants

## 2022-12-01 NOTE — Assessment & Plan Note (Signed)
Patient has had 10 pound weight gain since her last visit.  -labs look good. -stop phentermine due to ineffectiveness.  -patient to inquire about other weight loss medications such as Wegovy, Zepbound, Contrave, and Qsymia.  -will prescribe new medication as indicated.  -Continue with low calorie, high-protein diet. Incorporate exercise into daily activities.

## 2022-12-01 NOTE — Assessment & Plan Note (Signed)
-  stop phentermine due to ineffectiveness.  -patient to inquire about other weight loss medications such as Wegovy, Zepbound, Contrave, and Qsymia.  -will prescribe new medication as indicated.  -Continue with low calorie, high-protein diet. Incorporate exercise into daily activities.

## 2022-12-01 NOTE — Patient Instructions (Signed)
Contact insurance to see what, if any, weight loss medications are covered.   Contact office to let us know and we will send new prescription to your pharmacy.  -we will adjust the medication as needed and as indicated. Please let us know how you tolerate starting new medication so we can adjust them appropriately.

## 2022-12-09 ENCOUNTER — Other Ambulatory Visit: Payer: Self-pay | Admitting: Nurse Practitioner

## 2022-12-09 DIAGNOSIS — R635 Abnormal weight gain: Secondary | ICD-10-CM

## 2022-12-09 DIAGNOSIS — Z6831 Body mass index (BMI) 31.0-31.9, adult: Secondary | ICD-10-CM

## 2022-12-09 NOTE — Telephone Encounter (Signed)
I have placed the referral to [facility] for the patient

## 2023-02-02 ENCOUNTER — Ambulatory Visit: Payer: BC Managed Care – PPO | Admitting: Family Medicine

## 2023-02-02 ENCOUNTER — Encounter: Payer: Self-pay | Admitting: Family Medicine

## 2023-02-02 VITALS — BP 104/67 | HR 78 | Ht 62.2 in | Wt 187.8 lb

## 2023-02-02 DIAGNOSIS — R634 Abnormal weight loss: Secondary | ICD-10-CM

## 2023-02-02 MED ORDER — FLUTICASONE PROPIONATE 50 MCG/ACT NA SUSP: 2.0000 | Freq: Every day | NASAL | 2 refills | Status: DC

## 2023-02-02 NOTE — Assessment & Plan Note (Signed)
Patient has gained weight since stopping her medication and being more sedentary at work in addition to eating out more frequently secondary to work obligations. - Gave patient number for [facility] and advised her to call them since they have received the referral - Talked about calorie counting, recommended a calorie counting app to help keep track and recommended limiting to 2000 cal/day or less - Discussed ways to eat healthier and having to eat out. - Holding off on the phentermine at this time.

## 2023-02-02 NOTE — Progress Notes (Signed)
   Established Patient Office Visit  Subjective   Patient ID: Allison Fischer, female    DOB: 01-10-1986  Age: 37 y.o. MRN: [id number]  Chief Complaint  Patient presents with   Medical Management of Chronic Issues    HPI  Weight loss -patient willing to discuss weight loss.  In the past has taken phentermine with some improvement.  Recently had plastic surgery in [place] last year and since that time has not been taking her phentermine.  Also has a new job where she is less active and has to eat out more with clients.  Often goes to [employer] which do not have a lot of healthy options.  When the patient was taking her phentermine she felt like it was improving her hunger but noticed that it seemed to be wearing off over time.  Patient had been referred to healthy weight [facility] wellness at previous visit but has not heard back from anybody.  She goes to the gym several days a week.  Tries to choose healthy options but eats out often for work.  Discussed counting calories and apps to help track calorie intake.  Patient says that when she is doing the exercise bike sometimes her heart rate is elevated as high as the 190s.  A few times her watch will tell her her heart rate is elevated when she is resting but she has not checked it to see what her heart rate was at that time.  The ASCVD Risk score (Arnett DK, et al., 2019) failed to calculate for the following reasons:   The 2019 ASCVD risk score is only valid for ages 35 to 47  Health Maintenance Due  Topic Date Due   COVID-19 Vaccine (6 - 2023-24 season) 06/19/2022   PAP SMEAR-Modifier  01/20/2023   INFLUENZA VACCINE  01/22/2023      Objective:     BP 104/67   Pulse 78   Ht 5' 2.2" (1.58 m)   Wt 187 lb 12.8 oz (85.2 kg)   SpO2 100%   BMI 34.13 kg/m    Physical Exam General: Alert and oriented Pulmonary: No respiratory distress Psych: Pleasant affect   No results found for any visits on 02/02/23.      Assessment &  Plan:   Weight loss Assessment & Plan: Patient has gained weight since stopping her medication and being more sedentary at work in addition to eating out more frequently secondary to work obligations. - Gave patient number for [facility] and advised her to call them since they have received the referral - Talked about calorie counting, recommended a calorie counting app to help keep track and recommended limiting to 2000 cal/day or less - Discussed ways to eat healthier and having to eat out. - Holding off on the phentermine at this time.   Other orders -     Fluticasone Propionate; Place 2 sprays into both nostrils daily.  Dispense: 15.8 mL; Refill: 2     Return in about 3 months (around 05/05/2023) for weight.    Sandre Kitty, MD

## 2023-02-02 NOTE — Patient Instructions (Signed)
It was nice to see you today,  We addressed the following topics today: - call healthy [facility] wellness at [phone] to schedule your appointment.  - limit your calories to less than 2000/day.   - use calorie counting apps like Lose It to help you keep track.   Have a great day,  Frederic Jericho, MD

## 2023-02-16 DIAGNOSIS — Z0289 Encounter for other administrative examinations: Secondary | ICD-10-CM

## 2023-02-17 ENCOUNTER — Ambulatory Visit: Payer: BC Managed Care – PPO | Admitting: Family Medicine

## 2023-02-17 ENCOUNTER — Encounter: Payer: Self-pay | Admitting: Family Medicine

## 2023-02-17 VITALS — BP 110/72 | HR 86 | Temp 98.6°F | Ht 62.0 in | Wt 186.0 lb

## 2023-02-17 DIAGNOSIS — Z6834 Body mass index (BMI) 34.0-34.9, adult: Secondary | ICD-10-CM

## 2023-02-17 DIAGNOSIS — E785 Hyperlipidemia, unspecified: Secondary | ICD-10-CM | POA: Diagnosis not present

## 2023-02-17 DIAGNOSIS — F509 Eating disorder, unspecified: Secondary | ICD-10-CM | POA: Insufficient documentation

## 2023-02-17 DIAGNOSIS — E6609 Other obesity due to excess calories: Secondary | ICD-10-CM

## 2023-02-17 NOTE — Progress Notes (Signed)
Office: [phone]  /  Fax: [phone]   Initial Visit  Allison Fischer was seen in clinic today to evaluate for obesity. She is interested in losing weight to improve overall health and reduce the risk of weight related complications. She presents today to review program treatment options, initial physical assessment, and evaluation.     She was referred by: PCP  When asked what else they would like to accomplish? She states: Adopt healthier eating patterns, Improve quality of life, and Improve self-confidence  Weight history: gained weight thru childhood when parents divorced.  Gained weight when moving to [place].  Her mom has also been obese and had gastric bypass.  She has gained weight after having 2 kids.  Hx of emotional eating.   When asked how has your weight affected you? She states: Contributed to orthopedic problems or mobility issues and Having fatigue  Some associated conditions: Hyperlipidemia  Contributing factors: Family history, Stress, and Life event  Weight promoting medications identified: None  Current nutrition plan: None  Current level of physical activity: Strength training and Other: cycling  Current or previous pharmacotherapy: Phentermine  Response to medication: Lost weight initially but was unable to sustain weight loss Some mild headaches  Past medical history includes:   Past Medical History:  Diagnosis Date   Allergy    Medical history non-contributory      Objective:   BP 110/72   Pulse 86   Temp 98.6 F (37 C)   Ht 5\' 2"  (1.575 m)   Wt 186 lb (84.4 kg)   SpO2 100%   BMI 34.02 kg/m  She was weighed on the bioimpedance scale: Body mass index is 34.02 kg/m.  Peak Weight:230 , Body Fat%:38.8, Visceral Fat Rating:8, Weight trend over the last 12 months: Increasing  General:  Alert, oriented and cooperative. Patient is in no acute distress.  Respiratory: Normal respiratory effort, no problems with respiration noted   Gait: able to  ambulate independently  Mental Status: Normal mood and affect. Normal behavior. Normal judgment and thought content.   DIAGNOSTIC DATA REVIEWED:  BMET    Component Value Date/Time   NA 141 08/25/2022 0834   K 5.0 08/25/2022 0834   CL 107 (H) 08/25/2022 0834   CO2 21 08/25/2022 0834   GLUCOSE 91 08/25/2022 0834   GLUCOSE 80 08/29/2021 1052   BUN 19 08/25/2022 0834   CREATININE 0.66 08/25/2022 0834   CREATININE 0.66 08/30/2013 1617   CALCIUM 9.1 08/25/2022 0834   GFRNONAA >60 08/29/2021 1052   GFRAA >60 09/28/2017 1406   Lab Results  Component Value Date   HGBA1C 5.6 08/25/2022   HGBA1C 5.4 11/01/2020   No results found for: "INSULIN" CBC    Component Value Date/Time   WBC 4.4 08/25/2022 0834   WBC 6.4 08/29/2021 1052   RBC 4.56 08/25/2022 0834   RBC 4.24 08/29/2021 1052   HGB 13.4 08/25/2022 0834   HCT 39.6 08/25/2022 0834   PLT 298 08/25/2022 0834   MCV 87 08/25/2022 0834   MCH 29.4 08/25/2022 0834   MCH 29.0 08/29/2021 1052   MCHC 33.8 08/25/2022 0834   MCHC 32.5 08/29/2021 1052   RDW 11.9 08/25/2022 0834   Iron/TIBC/Ferritin/ %Sat No results found for: "IRON", "TIBC", "FERRITIN", "IRONPCTSAT" Lipid Panel     Component Value Date/Time   CHOL 174 08/25/2022 0834   TRIG 93 08/25/2022 0834   HDL 61 08/25/2022 0834   CHOLHDL 2.9 08/25/2022 0834   LDLCALC 96 08/25/2022 0834  Hepatic Function Panel     Component Value Date/Time   PROT 6.2 08/25/2022 0834   ALBUMIN 4.1 08/25/2022 0834   AST 14 08/25/2022 0834   ALT 11 08/25/2022 0834   ALKPHOS 69 08/25/2022 0834   BILITOT 0.6 08/25/2022 0834      Component Value Date/Time   TSH 0.701 08/25/2022 0834     Assessment and Plan:   Hyperlipidemia LDL goal <100 Assessment & Plan: Lab Results  Component Value Date   CHOL 174 08/25/2022   HDL 61 08/25/2022   LDLCALC 96 08/25/2022   TRIG 93 08/25/2022   CHOLHDL 2.9 08/25/2022   Last FLP at goal without use of lipid lowering medication.  She has been  following a low saturated fat diet.  Continue current dietary changes with regular exercise 4-5 x a week    Class 1 obesity due to excess calories without serious comorbidity with body mass index (BMI) of 34.0 to 34.9 in adult  Eating disorder, unspecified type Assessment & Plan: Pt reports a long hx of disordered eating based on mood changes.  This started in childhood when parents divorced.  Her mom is living with she, her husband and her 2 kids and is now supportive of healthier eating habits.  Stress levels have reduced.  Consider the use of Buproprion and / or CBT for treatment         Obesity Treatment / Action Plan:  Patient will work on garnering support from family and friends to begin weight loss journey. Will work on eliminating or reducing the presence of highly palatable, calorie dense foods in the home. Will complete provided nutritional and psychosocial assessment questionnaire before the next appointment. Will be scheduled for indirect calorimetry to determine resting energy expenditure in a fasting state.  This will allow [place] to create a reduced calorie, high-protein meal plan to promote loss of fat mass while preserving muscle mass. Counseled on the health benefits of losing 5%-15% of total body weight. Was counseled on nutritional approaches to weight loss and benefits of reducing processed foods and consuming plant-based foods and high quality protein as part of nutritional weight management. Was counseled on pharmacotherapy and role as an adjunct in weight management.   Obesity Education Performed Today:  She was weighed on the bioimpedance scale and results were discussed and documented in the synopsis.  We discussed obesity as a disease and the importance of a more detailed evaluation of all the factors contributing to the disease.  We discussed the importance of long term lifestyle changes which include nutrition, exercise and behavioral modifications as well  as the importance of customizing this to her specific health and social needs.  We discussed the benefits of reaching a healthier weight to alleviate the symptoms of existing conditions and reduce the risks of the biomechanical, metabolic and psychological effects of obesity.  Sayra Foco appears to be in the action stage of change and states they are ready to start intensive lifestyle modifications and behavioral modifications.  30 minutes was spent today on this visit including the above counseling, pre-visit chart review, and post-visit documentation.  Reviewed by clinician on day of visit: allergies, medications, problem list, medical history, surgical history, family history, social history, and previous encounter notes pertinent to obesity diagnosis.    Seymour Bars, D.O. DABFM, DABOM [facility] [phone] [place], [place] [address] [phone]

## 2023-02-17 NOTE — Assessment & Plan Note (Signed)
Lab Results  Component Value Date   CHOL 174 08/25/2022   HDL 61 08/25/2022   LDLCALC 96 08/25/2022   TRIG 93 08/25/2022   CHOLHDL 2.9 08/25/2022   Last FLP at goal without use of lipid lowering medication.  She has been following a low saturated fat diet.  Continue current dietary changes with regular exercise 4-5 x a week

## 2023-02-17 NOTE — Assessment & Plan Note (Signed)
Pt reports a long hx of disordered eating based on mood changes.  This started in childhood when parents divorced.  Her mom is living with she, her husband and her 2 kids and is now supportive of healthier eating habits.  Stress levels have reduced.  Consider the use of Buproprion and / or CBT for treatment

## 2023-02-25 ENCOUNTER — Other Ambulatory Visit: Payer: Self-pay

## 2023-02-25 ENCOUNTER — Ambulatory Visit (HOSPITAL_COMMUNITY)
Admission: RE | Admit: 2023-02-25 | Discharge: 2023-02-25 | Disposition: A | Discharge status: Home / Self Care | Payer: BC Managed Care – PPO | Source: Ambulatory Visit | Attending: Family Medicine

## 2023-02-25 ENCOUNTER — Encounter: Payer: Self-pay | Admitting: Family Medicine

## 2023-02-25 ENCOUNTER — Ambulatory Visit: Payer: BC Managed Care – PPO | Admitting: Family Medicine

## 2023-02-25 VITALS — BP 91/68 | HR 89 | Temp 98.3°F | Ht 62.0 in | Wt 186.0 lb

## 2023-02-25 DIAGNOSIS — R632 Polyphagia: Secondary | ICD-10-CM | POA: Diagnosis not present

## 2023-02-25 DIAGNOSIS — F3289 Other specified depressive episodes: Secondary | ICD-10-CM

## 2023-02-25 DIAGNOSIS — R5383 Other fatigue: Secondary | ICD-10-CM | POA: Insufficient documentation

## 2023-02-25 DIAGNOSIS — E669 Obesity, unspecified: Secondary | ICD-10-CM

## 2023-02-25 DIAGNOSIS — Z1331 Encounter for screening for depression: Secondary | ICD-10-CM | POA: Insufficient documentation

## 2023-02-25 DIAGNOSIS — F32A Depression, unspecified: Secondary | ICD-10-CM | POA: Insufficient documentation

## 2023-02-25 DIAGNOSIS — R0602 Shortness of breath: Secondary | ICD-10-CM | POA: Diagnosis not present

## 2023-02-25 DIAGNOSIS — Z6834 Body mass index (BMI) 34.0-34.9, adult: Secondary | ICD-10-CM | POA: Insufficient documentation

## 2023-02-26 LAB — HEMOGLOBIN A1C
Est. average glucose Bld gHb Est-mCnc: 105 mg/dL
Hgb A1c MFr Bld: 5.3 % (ref 4.8–5.6)

## 2023-02-26 LAB — IRON AND TIBC
Iron Saturation: 28 % (ref 15–55)
Iron: 78 ug/dL (ref 27–159)
Total Iron Binding Capacity: 279 ug/dL (ref 250–450)
UIBC: 201 ug/dL (ref 131–425)

## 2023-02-26 LAB — COMPREHENSIVE METABOLIC PANEL
ALT: 9 IU/L (ref 0–32)
AST: 16 IU/L (ref 0–40)
Albumin: 4.3 g/dL (ref 3.9–4.9)
Alkaline Phosphatase: 70 IU/L (ref 44–121)
BUN/Creatinine Ratio: 22 (ref 9–23)
BUN: 15 mg/dL (ref 6–20)
Bilirubin Total: 0.7 mg/dL (ref 0.0–1.2)
CO2: 22 mmol/L (ref 20–29)
Calcium: 9.7 mg/dL (ref 8.7–10.2)
Chloride: 102 mmol/L (ref 96–106)
Creatinine, Ser: 0.69 mg/dL (ref 0.57–1.00)
Globulin, Total: 2.4 g/dL (ref 1.5–4.5)
Glucose: 97 mg/dL (ref 70–99)
Potassium: 5 mmol/L (ref 3.5–5.2)
Sodium: 138 mmol/L (ref 134–144)
Total Protein: 6.7 g/dL (ref 6.0–8.5)
eGFR: 116 mL/min/{1.73_m2} (ref >59)

## 2023-02-26 LAB — INSULIN, RANDOM: INSULIN: 12.8 u[IU]/mL (ref 2.6–24.9)

## 2023-02-26 LAB — TSH: TSH: 0.632 u[IU]/mL (ref 0.450–4.500)

## 2023-02-26 LAB — T4, FREE: Free T4: 1.22 ng/dL (ref 0.82–1.77)

## 2023-02-26 LAB — VITAMIN D 25 HYDROXY (VIT D DEFICIENCY, FRACTURES): Vit D, 25-Hydroxy: 34.4 ng/mL (ref 30.0–100.0)

## 2023-02-26 LAB — FERRITIN: Ferritin: 293 ng/mL — ABNORMAL HIGH (ref 15–150)

## 2023-02-26 LAB — FOLATE: Folate: 14.2 ng/mL (ref >3.0)

## 2023-02-26 LAB — VITAMIN B12: Vitamin B-12: 601 pg/mL (ref 232–1245)

## 2023-02-26 NOTE — Progress Notes (Signed)
Chief Complaint:   OBESITY Allison Fischer (MR# [id number]) is a 37 y.o. female who presents for evaluation and treatment of obesity and related comorbidities. Current BMI is Body mass index is 34.02 kg/m. Allison Fischer has been struggling with her weight for many years and has been unsuccessful in either losing weight, maintaining weight loss, or reaching her healthy weight goal.  Allison Fischer is currently in the action stage of change and ready to dedicate time achieving and maintaining a healthier weight. Allison Fischer is interested in becoming our patient and working on intensive lifestyle modifications including (but not limited to) diet and exercise for weight loss.  Patient works in [employer], and she is sitting more and eating out with clients.  She is married with 2 young sons, and her mother lives with them.  She snacks on bread with coffee.  She averages 5000 steps per day and lifts weights or cycles for 1 hour 5 times per week.  Allison Fischer's habits were reviewed today and are as follows: Her family eats meals together, she thinks her family will eat healthier with her, she struggles with family and or coworkers weight loss sabotage, her desired weight loss is 41 lbs, she has been heavy most of her life, she started gaining weight after her pregnancies, her heaviest weight ever was 205 pounds, she has significant food cravings issues, she skips meals frequently, she is frequently drinking liquids with calories, she frequently makes poor food choices, she frequently eats larger portions than normal, and she struggles with emotional eating.  Depression Screen Allison Fischer's Food and Mood (modified PHQ-9) score was 12.  Subjective:   1. Other fatigue Allison Fischer admits to daytime somnolence and admits to waking up still tired. Patient has a history of symptoms of daytime fatigue, morning fatigue, and morning headache. Allison Fischer generally gets 6 or 7 hours of sleep per night, and states that she has nightime awakenings and generally  restful sleep. Snoring is present. Apneic episodes are not present. Epworth Sleepiness Score is 5.  EKG-normal sinus rhythm at 89 bpm without ischemia.  2. SOBOE (shortness of breath on exertion) [name] notes increasing shortness of breath with exercising and seems to be worsening over time with weight gain. She notes getting out of breath sooner with activity than she used to. This has not gotten worse recently. [name] denies shortness of breath at rest or orthopnea.  3. Polyphagia Reviewed metabolic testing results with the patient.  She is likely under consuming lean protein intake leading to over snacking and increased carbohydrate intake.  4. Other depression with emotional eating Patient's bariatric PHQ-9 score is 12.  She has a good support system at home.  Assessment/Plan:   1. Other fatigue Allison Fischer does feel that her weight is causing her energy to be lower than it should be. Fatigue may be related to obesity, depression or many other causes. Labs will be ordered, and in the meanwhile, Allison Fischer will focus on self care including making healthy food choices, increasing physical activity and focusing on stress reduction.  - EKG 12-Lead - VITAMIN D 25 Hydroxy (Vit-D Deficiency, Fractures) - TSH - T4, free - Insulin, random - Hemoglobin A1c - Folate - Comprehensive metabolic panel - Vitamin B12 - Ferritin - Iron and TIBC  2. SOBOE (shortness of breath on exertion) Allison Fischer does feel that she gets out of breath more easily that she used to when she exercises. Allison Fischer's shortness of breath appears to be obesity related and exercise induced. She has agreed to work  on weight loss and gradually increase exercise to treat her exercise induced shortness of breath. Will continue to monitor closely.  3. Polyphagia Patient will begin her prescribed meal plan and to reduce meals out.  Will consider Zepbound.  4. Other depression with emotional eating Patient is to work on keeping junk food triggers  out of sight.  She will work on stress reductions, and consider the use of Wellbutrin.  5. Depression screen Allison Fischer had a positive depression screening. Depression is commonly associated with obesity and often results in emotional eating behaviors. We will monitor this closely and work on CBT to help improve the non-hunger eating patterns. Referral to Psychology may be required if no improvement is seen as she continues in our clinic.  6. BMI 34.0-34.9,adult  7. Generalized obesity with starting BMI 34.0 Allison Fischer is currently in the action stage of change and her goal is to continue with weight loss efforts. I recommend Allison Fischer begin the structured treatment plan as follows:  She has agreed to the Category 3 Plan.  100-calorie snack list was given.   Exercise goals: Increase steps to 7,000 per day.    Behavioral modification strategies: increasing lean protein intake, increasing vegetables, increasing water intake, decreasing eating out, no skipping meals, keeping healthy foods in the home, better snacking choices, and decreasing junk food.  She was informed of the importance of frequent follow-up visits to maximize her success with intensive lifestyle modifications for her multiple health conditions. She was informed we would discuss her lab results at her next visit unless there is a critical issue that needs to be addressed sooner. Allison Fischer agreed to keep her next visit at the agreed upon time to discuss these results.  Objective:   Blood pressure 91/68, pulse 89, temperature 98.3 F (36.8 C), height 5\' 2"  (1.575 m), weight 186 lb (84.4 kg), SpO2 100%. Body mass index is 34.02 kg/m.  EKG: Normal sinus rhythm, rate 89 BPM.  Indirect Calorimeter completed today shows a VO2 of 241 and a REE of 1656.  Her calculated basal metabolic rate is 2130 thus her basal metabolic rate is better than expected.  General: Cooperative, alert, well developed, in no acute distress. HEENT: Conjunctivae and lids  unremarkable. Cardiovascular: Regular rhythm.  Lungs: Normal work of breathing. Neurologic: No focal deficits.   Lab Results  Component Value Date   CREATININE 0.69 02/25/2023   BUN 15 02/25/2023   NA 138 02/25/2023   K 5.0 02/25/2023   CL 102 02/25/2023   CO2 22 02/25/2023   Lab Results  Component Value Date   ALT 9 02/25/2023   AST 16 02/25/2023   ALKPHOS 70 02/25/2023   BILITOT 0.7 02/25/2023   Lab Results  Component Value Date   HGBA1C 5.3 02/25/2023   HGBA1C 5.6 08/25/2022   HGBA1C 5.4 11/01/2020   Lab Results  Component Value Date   INSULIN 12.8 02/25/2023   Lab Results  Component Value Date   TSH 0.632 02/25/2023   Lab Results  Component Value Date   CHOL 174 08/25/2022   HDL 61 08/25/2022   LDLCALC 96 08/25/2022   TRIG 93 08/25/2022   CHOLHDL 2.9 08/25/2022   Lab Results  Component Value Date   WBC 4.4 08/25/2022   HGB 13.4 08/25/2022   HCT 39.6 08/25/2022   MCV 87 08/25/2022   PLT 298 08/25/2022   Lab Results  Component Value Date   IRON 78 02/25/2023   TIBC 279 02/25/2023   FERRITIN 293 (H) 02/25/2023  Attestation Statements:   Reviewed by clinician on day of visit: allergies, medications, problem list, medical history, surgical history, family history, social history, and previous encounter notes.  I have personally spent 40 minutes total time today in preparation, patient care, nutritional counseling and documentation for this visit, including the following: review of clinical lab tests; review of medical tests/procedures/services.   Trude Mcburney, am acting as transcriptionist for Seymour Bars, DO.  I have reviewed the above documentation for accuracy and completeness, and I agree with the above. Seymour Bars, DO

## 2023-03-11 ENCOUNTER — Encounter: Payer: Self-pay | Admitting: Family Medicine

## 2023-03-11 ENCOUNTER — Ambulatory Visit: Payer: BC Managed Care – PPO | Admitting: Family Medicine

## 2023-03-11 VITALS — BP 96/59 | HR 99 | Temp 98.8°F | Ht 62.0 in | Wt 184.0 lb

## 2023-03-11 DIAGNOSIS — Z6833 Body mass index (BMI) 33.0-33.9, adult: Secondary | ICD-10-CM | POA: Diagnosis not present

## 2023-03-11 DIAGNOSIS — E669 Obesity, unspecified: Secondary | ICD-10-CM | POA: Diagnosis not present

## 2023-03-11 DIAGNOSIS — R000 Tachycardia, unspecified: Secondary | ICD-10-CM | POA: Diagnosis not present

## 2023-03-11 DIAGNOSIS — R7989 Other specified abnormal findings of blood chemistry: Secondary | ICD-10-CM

## 2023-03-11 NOTE — Assessment & Plan Note (Addendum)
In review of her chart, she had tachycardia while on Phentermine with exercise and was discontinued earlier this year.   She denies heart palpitations.  She averages 2 cups of coffee daily.  Resting HR is < 100 bpm Avoid future use of Phentermine

## 2023-03-11 NOTE — Assessment & Plan Note (Signed)
Reviewed labs with patient Ferritin was elevated on labs with a normal iron panel This is likely from underlying inflammation  Will repeat ferritin with a CBC and ESR in one month

## 2023-03-11 NOTE — Progress Notes (Signed)
Office: [phone]  /  Fax: [phone]  WEIGHT SUMMARY AND BIOMETRICS  Starting Date: 02/25/23  Starting Weight: 186lb   Weight Lost Since Last Visit: 2lb   Vitals Temp: 98.8 F (37.1 C) BP: (!) 96/59 Pulse Rate: 99 SpO2: 100 %   Body Composition  Body Fat %: 38.2 % Fat Mass (lbs): 70.4 lbs Muscle Mass (lbs): 108 lbs Total Body Water (lbs): 74.8 lbs Visceral Fat Rating : 8   HPI  Chief Complaint: OBESITY  Allison Fischer is here to discuss her progress with her obesity treatment plan. She is on the the Category 3 Plan and states she is following her eating plan approximately 90 % of the time. She states she is walking 30 minutes 2 times per week.   Interval History:  Since last office visit she is down 2 lb She is down 0.4 lb of muscle and down 1.4 lb of body fat in the past 2 weeks She is eating on plan for breakfast.  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  Her mom is often cooking dinner.  She is sometimes having a starch with dinner She is drinking black coffee in the afternoon She is not craving starches or sweets She is rarely snacking Due to a busy schedule, she hasn't been working out much but she is tracking steps  Pharmacotherapy: none  PHYSICAL EXAM:  Blood pressure (!) 96/59, pulse 99, temperature 98.8 F (37.1 C), height 5\' 2"  (1.575 m), weight 184 lb (83.5 kg), SpO2 100%. Body mass index is 33.65 kg/m.  General: She is overweight, cooperative, alert, well developed, and in no acute distress. PSYCH: Has normal mood, affect and thought process.   Lungs: Normal breathing effort, no conversational dyspnea.   ASSESSMENT AND PLAN  TREATMENT PLAN FOR OBESITY:  Recommended Dietary Goals  Ryan is currently in the action stage of change. As such, her goal is to continue weight management plan. She has agreed to the Category 3 Plan. - allow a 2nd fruit serving daily (any) - allow 1/4 plate of a high fiber carb with with dinner - eating  out guide given - keep lunches out ~500 cal and 30 g of protein  Behavioral Intervention  We discussed the following Behavioral Modification Strategies today: increasing lean protein intake, decreasing simple carbohydrates , increasing vegetables, increasing lower glycemic fruits, increasing fiber rich foods, increasing water intake, work on meal planning and preparation, keeping healthy foods at home, avoiding temptations and identifying enticing environmental cues, continue to practice mindfulness when eating, and planning for success.  Additional resources provided today: NA  Recommended Physical Activity Goals  Johnnae has been advised to work up to 150 minutes of moderate intensity aerobic activity a week and strengthening exercises 2-3 times per week for cardiovascular health, weight loss maintenance and preservation of muscle mass.   She has agreed to [employer] strengthening exercises with a goal of 2-3 sessions a week  and Work on scheduling and tracking physical activity.  - resume weight training 2 x a week - aim for 7,000 + steps/ day  Pharmacotherapy changes for the treatment of obesity: none  ASSOCIATED CONDITIONS ADDRESSED TODAY  Elevated ferritin Assessment & Plan: Reviewed labs with patient Ferritin was elevated on labs with a normal iron panel This is likely from underlying inflammation  Will repeat ferritin with a CBC and ESR in one month   Generalized obesity with starting BMI 34.0  BMI 33.0-33.9,adult  Tachycardia, unspecified Assessment & Plan: In review of  her chart, she had tachycardia while on Phentermine with exercise and was discontinued earlier this year.   She denies heart palpitations.  She averages 2 cups of coffee daily.  Resting HR is < 100 bpm Avoid future use of Phentermine       She was informed of the importance of frequent follow up visits to maximize her success with intensive lifestyle modifications for her multiple health  conditions.   ATTESTASTION STATEMENTS:  Reviewed by clinician on day of visit: allergies, medications, problem list, medical history, surgical history, family history, social history, and previous encounter notes pertinent to obesity diagnosis.   I have personally spent 30 minutes total time today in preparation, patient care, nutritional counseling and documentation for this visit, including the following: review of clinical lab tests; review of medical tests/procedures/services.      Glennis Brink, DO DABFM, DABOM [facility] [address] [place], [place] [address] [phone]

## 2023-03-12 LAB — HM PAP SMEAR: HM Pap smear: NEGATIVE

## 2023-03-13 ENCOUNTER — Encounter: Payer: Self-pay | Admitting: Family Medicine

## 2023-03-31 ENCOUNTER — Encounter: Payer: Self-pay | Admitting: Family Medicine

## 2023-03-31 ENCOUNTER — Ambulatory Visit: Payer: BC Managed Care – PPO | Admitting: Family Medicine

## 2023-03-31 VITALS — BP 95/65 | HR 79 | Temp 98.3°F | Ht 62.0 in | Wt 183.0 lb

## 2023-03-31 DIAGNOSIS — E66811 Obesity, class 1: Secondary | ICD-10-CM

## 2023-03-31 DIAGNOSIS — Z6833 Body mass index (BMI) 33.0-33.9, adult: Secondary | ICD-10-CM | POA: Diagnosis not present

## 2023-03-31 DIAGNOSIS — R632 Polyphagia: Secondary | ICD-10-CM

## 2023-03-31 DIAGNOSIS — E559 Vitamin D deficiency, unspecified: Secondary | ICD-10-CM

## 2023-03-31 MED ORDER — QSYMIA 3.75-23 MG PO CP24: ORAL_CAPSULE | ORAL | 0 refills | Status: DC

## 2023-03-31 MED ORDER — QSYMIA 7.5-46 MG PO CP24: ORAL_CAPSULE | ORAL | 0 refills | Status: DC

## 2023-03-31 NOTE — Progress Notes (Signed)
Office: [phone]  /  Fax: [phone]  WEIGHT SUMMARY AND BIOMETRICS  Starting Date: 02/25/23  Starting Weight: 186lb   Weight Lost Since Last Visit: 1lb   Vitals Temp: 98.3 F (36.8 C) BP: 95/65 Pulse Rate: 79 SpO2: 99 %   Body Composition  Body Fat %: 37.4 % Fat Mass (lbs): 68.8 lbs Muscle Mass (lbs): 109.2 lbs Total Body Water (lbs): 75.8 lbs Visceral Fat Rating : 8   HPI  Chief Complaint: OBESITY  Allison Fischer is here to discuss her progress with her obesity treatment plan. She is on the the Category 3 Plan and states she is following her eating plan approximately 50 % of the time. She states she is exercising 0 minutes 0 times per week.  Interval History:  Since last office visit she is down 1 lb She gained 1.2 lb of muscle and is down 1.6 lb of body fat since last visit She is getting in all of her meals Eating schedule is irregular due to her work schedule She is not snacking much She is using her dining out guide She is down 3 lb in the past month She has not been exercising much She was out of town for 4 days She had been on phentermine in the past  Pharmacotherapy: none  PHYSICAL EXAM:  Blood pressure 95/65, pulse 79, temperature 98.3 F (36.8 C), height 5\' 2"  (1.575 m), weight 183 lb (83 kg), SpO2 99%. Body mass index is 33.47 kg/m.  General: She is overweight, cooperative, alert, well developed, and in no acute distress. PSYCH: Has normal mood, affect and thought process.   Lungs: Normal breathing effort, no conversational dyspnea.   ASSESSMENT AND PLAN  TREATMENT PLAN FOR OBESITY:  Recommended Dietary Goals  [name] is currently in the action stage of change. As such, her goal is to continue weight management plan. She has agreed to the Category 3 Plan.  Behavioral Intervention  We discussed the following Behavioral Modification Strategies today: increasing lean protein intake to established goals, avoiding skipping meals, increasing  water intake , work on meal planning and preparation, keeping healthy foods at home, planning for success, and continue to work on maintaining a reduced calorie state, getting the recommended amount of protein, incorporating whole foods, making healthy choices, staying well hydrated and practicing mindfulness when eating..  Additional resources provided today: NA  Recommended Physical Activity Goals  [name] has been advised to work up to 150 minutes of moderate intensity aerobic activity a week and strengthening exercises 2-3 times per week for cardiovascular health, weight loss maintenance and preservation of muscle mass.   She has agreed to Start aerobic activity with a goal of 150 minutes a week at moderate intensity.   Pharmacotherapy changes for the treatment of obesity: information given on Saxenda/ Qsymia  ASSOCIATED CONDITIONS ADDRESSED TODAY  Polyphagia Assessment & Plan: Stable She has been working on eating on a schedule but has a limited eating schedule while at work and lunch can be late She is not snacking much  Recommend 90 g of dietary protein intake daily Bring a protein bar for a snack if lunch is late, brands given Consider adding Saxenda or Qsymia if needed   Obesity, Class I, BMI 30-34.9  BMI 33.0-33.9,adult  Vitamin D insufficiency Assessment & Plan: Last vitamin D Lab Results  Component Value Date   VD25OH 34.4 02/25/2023   Last vitamin D level less than goal of 50-70 Currently not taking a vitamin D supplement  Recommend OTC vitamin  D3 4,000 international units  daily       She was informed of the importance of frequent follow up visits to maximize her success with intensive lifestyle modifications for her multiple health conditions.   ATTESTASTION STATEMENTS:  Reviewed by clinician on day of visit: allergies, medications, problem list, medical history, surgical history, family history, social history, and previous encounter notes pertinent to  obesity diagnosis.   I have personally spent 30 minutes total time today in preparation, patient care, nutritional counseling and documentation for this visit, including the following: review of clinical lab tests; review of medical tests/procedures/services.      Glennis Brink, DO DABFM, DABOM [facility] [address] [place], [place] [address] [phone]

## 2023-03-31 NOTE — Assessment & Plan Note (Signed)
Stable She has been working on eating on a schedule but has a limited eating schedule while at work and lunch can be late She is not snacking much  Recommend 90 g of dietary protein intake daily Bring a protein bar for a snack if lunch is late, brands given Consider adding Saxenda or Qsymia if needed

## 2023-03-31 NOTE — Assessment & Plan Note (Signed)
Last vitamin D Lab Results  Component Value Date   VD25OH 34.4 02/25/2023   Last vitamin D level less than goal of 50-70 Currently not taking a vitamin D supplement  Recommend OTC vitamin D3 4,000 international units  daily

## 2023-04-30 ENCOUNTER — Ambulatory Visit: Payer: BC Managed Care – PPO | Admitting: Family Medicine

## 2023-04-30 ENCOUNTER — Encounter: Payer: Self-pay | Admitting: Family Medicine

## 2023-04-30 VITALS — BP 105/69 | HR 82 | Temp 98.3°F | Ht 62.0 in | Wt 185.0 lb

## 2023-04-30 DIAGNOSIS — E66811 Obesity, class 1: Secondary | ICD-10-CM

## 2023-04-30 DIAGNOSIS — F509 Eating disorder, unspecified: Secondary | ICD-10-CM

## 2023-04-30 DIAGNOSIS — Z6833 Body mass index (BMI) 33.0-33.9, adult: Secondary | ICD-10-CM | POA: Diagnosis not present

## 2023-04-30 MED ORDER — BUPROPION HCL ER (SR) 100 MG PO TB12
100.0000 mg | ORAL_TABLET | Freq: Two times a day (BID) | ORAL | 0 refills | Status: DC
Start: 2023-04-30 — End: 2023-06-01

## 2023-04-30 NOTE — Progress Notes (Signed)
Office: [phone]  /  Fax: [phone]  WEIGHT SUMMARY AND BIOMETRICS  Starting Date: 02/25/23  Starting Weight: 186lb   Weight Lost Since Last Visit: 0lb   Vitals Temp: 98.3 F (36.8 C) BP: 105/69 Pulse Rate: 82 SpO2: 100 %   Body Composition  Body Fat %: 38.2 % Fat Mass (lbs): 70.6 lbs Muscle Mass (lbs): 108.6 lbs Total Body Water (lbs): 74.6 lbs Visceral Fat Rating : 8    HPI  Chief Complaint: OBESITY  Allison Fischer is here to discuss her progress with her obesity treatment plan. She is on the the Category 3 Plan and states she is following her eating plan approximately 80 % of the time. She states she is exercising 30-60 minutes 2-3 times per week.  Interval History:  Since last office visit she is up 2 lb She has not been working out at [employer] and is thinking about home workouts She did not start on Qsymia due to cost She did take Phentermine in the past and hit a plateau over time She has been stuck eating catered lunches at work She struggles some with stress eating, overconsuming breads and pasta She has been lacking some protein intake at times She is not tracking her calories   Pharmacotherapy: none  PHYSICAL EXAM:  Blood pressure 105/69, pulse 82, temperature 98.3 F (36.8 C), height 5\' 2"  (1.575 m), weight 185 lb (83.9 kg), SpO2 100%. Body mass index is 33.84 kg/m.  General: She is overweight, cooperative, alert, well developed, and in no acute distress. PSYCH: Has normal mood, affect and thought process.   Lungs: Normal breathing effort, no conversational dyspnea.   ASSESSMENT AND PLAN  TREATMENT PLAN FOR OBESITY:  Recommended Dietary Goals  Allison Fischer is currently in the action stage of change. As such, her goal is to continue weight management plan. She has agreed to the Category 3 Plan. -She has the option to track her daily calories using my net diary.  Aim for 1400 cal/day.  This should include at least 80 g of protein daily.  We  discussed swapping out her morning bread for fruit.  She had been doing both.  Encouraged mindfulness about protein intake with meals  Behavioral Intervention  We discussed the following Behavioral Modification Strategies today: increasing lean protein intake to established goals, decreasing simple carbohydrates , increasing vegetables, avoiding skipping meals, increasing water intake , work on meal planning and preparation, keeping healthy foods at home, work on managing stress, creating time for self-care and relaxation, avoiding temptations and identifying enticing environmental cues, planning for success, better snacking choices, and continue to work on maintaining a reduced calorie state, getting the recommended amount of protein, incorporating whole foods, making healthy choices, staying well hydrated and practicing mindfulness when eating..  Additional resources provided today: NA  Recommended Physical Activity Goals  Allison Fischer has been advised to work up to 150 minutes of moderate intensity aerobic activity a week and strengthening exercises 2-3 times per week for cardiovascular health, weight loss maintenance and preservation of muscle mass.   She has agreed to Start aerobic activity with a goal of 150 minutes a week at moderate intensity.  -She is planning to change over to home exercise in the future.  In the meantime, we discussed adding in weekend walks, active play time with kids outside  Pharmacotherapy changes for the treatment of obesity: Begin Wellbutrin SR 100 mg twice daily; status post BTL, denies history of seizures  ASSOCIATED CONDITIONS ADDRESSED TODAY  Eating disorder, unspecified  type Assessment & Plan: Disordered eating behaviors with over consumption of starchy foods especially with stress.  She initially started to gain weight after her parents were divorced.  She reports a family history of emotional eating including her mom who had gastric bypass surgery.  Her mom is  currently living with them.  She is doing a better job of eating on a schedule incorporating lean protein and fiber with meals.  There are some junk food trigger snacks both at home and at work.  She had short-term success on phentermine in the past and other antiobesity medications were not covered by her insurance.  Will continue working on healthy behavior changes.  Consider adding in CBT with Dr. Dewaine Conger.  Begin Wellbutrin SR 100 mg twice daily.  We discussed potential adverse side effects.  Orders: -     buPROPion HCl ER (SR); Take 1 tablet (100 mg total) by mouth 2 (two) times daily.  Dispense: 60 tablet; Refill: 0  Class 1 obesity due to excess calories with body mass index (BMI) of 33.0 to 33.9 in adult, unspecified whether serious comorbidity present      She was informed of the importance of frequent follow up visits to maximize her success with intensive lifestyle modifications for her multiple health conditions.   ATTESTASTION STATEMENTS:  Reviewed by clinician on day of visit: allergies, medications, problem list, medical history, surgical history, family history, social history, and previous encounter notes pertinent to obesity diagnosis.   I have personally spent 30 minutes total time today in preparation, patient care, nutritional counseling and documentation for this visit, including the following: review of clinical lab tests; review of medical tests/procedures/services.      Glennis Brink, DO DABFM, DABOM [facility] [address] [place], [place] [address] [phone]

## 2023-04-30 NOTE — Assessment & Plan Note (Signed)
Disordered eating behaviors with over consumption of starchy foods especially with stress.  She initially started to gain weight after her parents were divorced.  She reports a family history of emotional eating including her mom who had gastric bypass surgery.  Her mom is currently living with them.  She is doing a better job of eating on a schedule incorporating lean protein and fiber with meals.  There are some junk food trigger snacks both at home and at work.  She had short-term success on phentermine in the past and other antiobesity medications were not covered by her insurance.  Will continue working on healthy behavior changes.  Consider adding in CBT with Dr. Dewaine Conger.  Begin Wellbutrin SR 100 mg twice daily.  We discussed potential adverse side effects.

## 2023-04-30 NOTE — Patient Instructions (Signed)
Either try sticking to your meal plan OR track daily calories using the MyNetDiary ap Aim for 1400 cal/ day This should include 80+ g of protein daily  Get in all 3 meals Remember protein + fiber with meals Remember that fruit counts as a CARB Try bringing a 100 calorie pack of nuts to work and/ or a protein bar to work Drink plenty of water  Check out options for home exercise options  Add in wellbutrin SR - one tab in the morning and one tab in the evening

## 2023-05-05 ENCOUNTER — Ambulatory Visit: Payer: BC Managed Care – PPO | Admitting: Family Medicine

## 2023-05-07 ENCOUNTER — Other Ambulatory Visit: Payer: Self-pay | Admitting: Family Medicine

## 2023-05-14 ENCOUNTER — Telehealth: Payer: Self-pay | Admitting: Family Medicine

## 2023-05-14 NOTE — Telephone Encounter (Signed)
Called patient to reschedule her 12/5 appointment, due to Dr.Bowen schedule conflict

## 2023-05-23 ENCOUNTER — Other Ambulatory Visit: Payer: Self-pay | Admitting: Family Medicine

## 2023-05-23 DIAGNOSIS — F509 Eating disorder, unspecified: Secondary | ICD-10-CM

## 2023-05-28 ENCOUNTER — Ambulatory Visit: Payer: BC Managed Care – PPO | Admitting: Family Medicine

## 2023-06-01 ENCOUNTER — Encounter: Payer: Self-pay | Admitting: Family Medicine

## 2023-06-01 ENCOUNTER — Ambulatory Visit: Payer: BC Managed Care – PPO | Admitting: Family Medicine

## 2023-06-01 VITALS — BP 99/66 | HR 77 | Temp 98.2°F | Ht 62.0 in | Wt 190.0 lb

## 2023-06-01 DIAGNOSIS — Z6834 Body mass index (BMI) 34.0-34.9, adult: Secondary | ICD-10-CM | POA: Diagnosis not present

## 2023-06-01 DIAGNOSIS — R632 Polyphagia: Secondary | ICD-10-CM

## 2023-06-01 DIAGNOSIS — E6609 Other obesity due to excess calories: Secondary | ICD-10-CM

## 2023-06-01 DIAGNOSIS — E66811 Obesity, class 1: Secondary | ICD-10-CM | POA: Diagnosis not present

## 2023-06-01 MED ORDER — PHENTERMINE HCL 15 MG PO CAPS: ORAL_CAPSULE | ORAL | 0 refills | Status: DC

## 2023-06-01 MED ORDER — TOPIRAMATE 25 MG PO TABS: 25.0000 mg | ORAL_TABLET | Freq: Every day | ORAL | 0 refills | Status: DC

## 2023-06-01 NOTE — Progress Notes (Signed)
Office: [phone]  /  Fax: [phone]  WEIGHT SUMMARY AND BIOMETRICS  Starting Date: 02/25/23  Starting Weight: 186lb   Weight Lost Since Last Visit: 0lb   Vitals Temp: 98.2 F (36.8 C) BP: 99/66 Pulse Rate: 77 SpO2: 100 %   Body Composition  Body Fat %: 41.4 % Fat Mass (lbs): 79 lbs Muscle Mass (lbs): 106.2 lbs Total Body Water (lbs): 78.6 lbs Visceral Fat Rating : 9     HPI  Chief Complaint: OBESITY  Allison Fischer is here to discuss her progress with her obesity treatment plan. She is on the the Category 3 Plan and states she is following her eating plan approximately 15 % of the time. She states she is exercising 0 minutes 0 times per week.   Interval History:  Since last office visit she is up 5 lb She did just get a treadmill and a weight bench for home use She did have some celebrations- Thanksgiving and birthdays She has not logged her intake Started Wellbutrin SR 100 mg bid (forgetting evening dose at times)- has had increased headaches in the past but has not really helped with emotional eating  For breakfast, she is having 2 eggs, coffee with fairlife and Monkfruit or Stevia 2 slices of low cal  bread  For lunch, she brought leftover pasta (sometimes leftover veggies, rice + meat)  Mom cooks dinner - meat/ potatoes/ veggies  She cut back on lunches out She avoids after dinner snacks  She has a net weight gain of 4 lb in 3 mos Her insurance does not cover AOMs  Pharmacotherapy: Wellbutrin SR 100 mg twice daily  PHYSICAL EXAM:  Blood pressure 99/66, pulse 77, temperature 98.2 F (36.8 C), height 5\' 2"  (1.575 m), weight 190 lb (86.2 kg), SpO2 100%. Body mass index is 34.75 kg/m.  General: She is overweight, cooperative, alert, well developed, and in no acute distress. PSYCH: Has normal mood, affect and thought process.   Lungs: Normal breathing effort, no conversational dyspnea.   ASSESSMENT AND PLAN  TREATMENT PLAN FOR  OBESITY:  Recommended Dietary Goals  Allison Fischer is currently in the action stage of change. As such, her goal is to continue weight management plan. She has agreed to the Category 3 Plan.  Behavioral Intervention  We discussed the following Behavioral Modification Strategies today: increasing lean protein intake to established goals, increasing fiber rich foods, avoiding skipping meals, increasing water intake , work on meal planning and preparation, work on Counselling psychologist calories using tracking application, keeping healthy foods at home, identifying sources and decreasing liquid calories, work on managing stress, creating time for self-care and relaxation, planning for success, and continue to work on maintaining a reduced calorie state, getting the recommended amount of protein, incorporating whole foods, making healthy choices, staying well hydrated and practicing mindfulness when eating..  Additional resources provided today: NA  Recommended Physical Activity Goals  Allison Fischer has been advised to work up to 150 minutes of moderate intensity aerobic activity a week and strengthening exercises 2-3 times per week for cardiovascular health, weight loss maintenance and preservation of muscle mass.   Pharmacotherapy changes for the treatment of obesity: Discontinue Wellbutrin SR 100 mg twice daily Begin phentermine 15 mg capsule and topiramate 25 mg tab dosed together 30 minutes before breakfast daily for both appetite and food impulse control Informed consent signed.  Reviewed mechanism of action and potential adverse side effects Patient is not at risk for pregnancy.  Avoid pregnancy due to risk of teratogenic  side effects from topiramate. She will use these medications in combination with a reduced calorie diet, regular exercise.  ASSOCIATED CONDITIONS ADDRESSED TODAY  Polyphagia -     Topiramate; Take 1 tablet (25 mg total) by mouth daily.  Dispense: 30 tablet; Refill: 0  Class 1  obesity due to excess calories with body mass index (BMI) of 34.0 to 34.9 in adult, unspecified whether serious comorbidity present -     Phentermine HCl; 1 capsule po 30 min before breakfast  Dispense: 30 capsule; Refill: 0      She was informed of the importance of frequent follow up visits to maximize her success with intensive lifestyle modifications for her multiple health conditions.   ATTESTASTION STATEMENTS:  Reviewed by clinician on day of visit: allergies, medications, problem list, medical history, surgical history, family history, social history, and previous encounter notes pertinent to obesity diagnosis.   I have personally spent 30 minutes total time today in preparation, patient care, nutritional counseling and documentation for this visit, including the following: review of clinical lab tests; review of medical tests/procedures/services.      Glennis Brink, DO DABFM, DABOM [facility] [address] [place], [place] [address] [phone]

## 2023-06-05 ENCOUNTER — Other Ambulatory Visit: Payer: Self-pay

## 2023-06-05 ENCOUNTER — Emergency Department (HOSPITAL_BASED_OUTPATIENT_CLINIC_OR_DEPARTMENT_OTHER)
Admission: EM | Admit: 2023-06-05 | Discharge: 2023-06-05 | Disposition: A | Discharge status: Home / Self Care | Payer: BC Managed Care – PPO | Attending: Emergency Medicine | Admitting: Emergency Medicine

## 2023-06-05 ENCOUNTER — Ambulatory Visit: Payer: Self-pay | Admitting: Family Medicine

## 2023-06-05 ENCOUNTER — Encounter (HOSPITAL_BASED_OUTPATIENT_CLINIC_OR_DEPARTMENT_OTHER): Payer: Self-pay | Admitting: Emergency Medicine

## 2023-06-05 DIAGNOSIS — H538 Other visual disturbances: Secondary | ICD-10-CM | POA: Insufficient documentation

## 2023-06-05 DIAGNOSIS — R519 Headache, unspecified: Secondary | ICD-10-CM | POA: Diagnosis not present

## 2023-06-05 MED ORDER — FLUORESCEIN SODIUM 1 MG OP STRP
1.0000 | ORAL_STRIP | Freq: Once | OPHTHALMIC | Status: AC
  Administered 2023-06-05: 1 via OPHTHALMIC
  Filled 2023-06-05: qty 1

## 2023-06-05 MED ORDER — TETRACAINE HCL 0.5 % OP SOLN
2.0000 [drp] | Freq: Once | OPHTHALMIC | Status: AC
  Administered 2023-06-05: 2 [drp] via OPHTHALMIC
  Filled 2023-06-05: qty 4

## 2023-06-05 NOTE — ED Triage Notes (Signed)
Pt caox4, ambulatory, NAD c/o blurry vision in both eyes. Pt states she woke up yesterday morning with blurry vision. 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 Pt states she was switched from Bupropion to Topamax on Wednesday and took her first dose of the Topamax Wednesday.

## 2023-06-05 NOTE — ED Provider Notes (Signed)
EMERGENCY DEPARTMENT AT [facility] Provider Note   CSN: [id number] Arrival date & time: 06/05/23  1003     History  Chief Complaint  Patient presents with   Eye Problem    Allison Fischer is a 37 y.o. female.   Eye Problem Patient presents with bilateral blurred vision.  Began on Wednesday with today being Friday.  Began after starting Topamax.  Was started on Topamax for appetite suppression.  Had been on Wellbutrin and stopped that although had been only on it for 2 weeks and not taking it very frequently during that time.  States the vision is blurry in both eyes.  Some mild pain in the back of her head and into her eyes.  Worse with bright lights.  No trauma.  No numbness weakness.  Does have a dull headache.    Past Medical History:  Diagnosis Date   Allergy    Back pain    Constipation    Edema    Medical history non-contributory    SOB (shortness of breath)     Home Medications Prior to Admission medications   Medication Sig Start Date End Date Taking? Authorizing Provider  fluticasone (FLONASE) 50 MCG/ACT nasal spray SPRAY 2 SPRAYS INTO EACH NOSTRIL EVERY DAY 05/07/23   Sandre Kitty, MD  phentermine 15 MG capsule 1 capsule po 30 min before breakfast 06/01/23   Bowen, Scot Jun, DO  topiramate (TOPAMAX) 25 MG tablet Take 1 tablet (25 mg total) by mouth daily. 06/01/23   Bowen, Scot Jun, DO      Allergies    Penicillins and Tape    Review of Systems   Review of Systems  Physical Exam Updated Vital Signs BP 115/82 (BP Location: Right Arm)   Pulse 93   Temp 98.2 F (36.8 C) (Oral)   Resp 13   Ht 5\' 2"  (1.575 m)   Wt 86.2 kg   LMP 05/11/2023 (Exact Date)   SpO2 100%   BMI 34.75 kg/m  Physical Exam Vitals and nursing note reviewed.  Eyes:     General: No scleral icterus.    Extraocular Movements: Extraocular movements intact.     Conjunctiva/sclera: Conjunctivae normal.     Pupils: Pupils are equal, round, and reactive to light.   Cardiovascular:     Rate and Rhythm: Regular rhythm.  Pulmonary:     Breath sounds: Normal breath sounds.  Abdominal:     Tenderness: There is no abdominal tenderness.  Musculoskeletal:     Cervical back: Neck supple.  Neurological:     Mental Status: She is alert.     ED Results / Procedures / Treatments   Labs (all labs ordered are listed, but only abnormal results are displayed) Labs Reviewed - No data to display  EKG None  Radiology No results found.  Procedures Procedures    Medications Ordered in ED Medications  fluorescein ophthalmic strip 1 strip (1 strip Both Eyes Given 06/05/23 1051)  tetracaine (PONTOCAINE) 0.5 % ophthalmic solution 2 drop (2 drops Both Eyes Given 06/05/23 1052)    ED Course/ Medical Decision Making/ A&P                                 Medical Decision Making Risk Prescription drug management.   Patient with vision change.  Photophobia.  Began acutely after taking Topamax.  Differential diagnosis includes medication effect, glaucoma, migraine.  I think however the  vision is the primary cause.  I think most likely is secondary to the medication.  Intraocular pressure was 15 on the left and 14 on the right.  Did not change pain with the tetracaine.  No corneal uptake.  Has ophthalmology follow-up in 4 days.  Will stop Topamax for now.  Do not think we need CT imaging.  Will discharge home.        Final Clinical Impression(s) / ED Diagnoses Final diagnoses:  Blurred vision, bilateral    Rx / DC Orders ED Discharge Orders     None         Benjiman Core, MD 06/05/23 1146

## 2023-06-05 NOTE — Telephone Encounter (Signed)
Copied from CRM [id number]. Topic: Appointments - Appointment Scheduling >> Jun 05, 2023  8:40 AM Louie Casa B wrote: Patient/patient representative is calling to schedule an appointment. Pt woke up with very blurry vision Refer to attachments for appointment information.   Chief Complaint: blurry vision Symptoms: headache/pressure behind eyes 4/10 Frequency: constant  Pertinent Negatives: Patient denies weakness in extremities  Disposition: [x] ED /[] Urgent Care (no appt availability in office) / [] Appointment(In office/virtual)/ []  [facility]/ [] Home Care/ [] Refused Recommended Disposition /[] [facility]/ []  Follow-up with PCP Additional Notes: The patient reported sudden blurry vision that started yesterday when she woke up.  She also reported intermittent 4/10 pain described as pressure behind both eyes.  She was unable to drive because she didn't feel safe doing so.  She reported that she took the day off to "rest her eyes" but she woke up today with the same symptoms.  She said, "I can see everything 3 inches close but after that everything is super blurry."  She was prescribed a new medication, topiramate (Topomax) 25 mg one in the morning and one in the afternoon to control appetite.  She started the medicine on Tuesday night.  She is concerned that it is a possible side effect.  The office who prescribed the medication was closed.  Advised the patient to go to the ED for further evaluation.  Reason for Disposition  [1] Blurred vision or visual changes AND [2] present now AND [3] sudden onset or new (e.g., minutes, hours, days)  (Exception: Seeing floaters / black specks OR previously diagnosed migraine headaches with same symptoms.)  Answer Assessment - Initial Assessment Questions 1. DESCRIPTION: "How has your vision changed?" (e.g., complete vision loss, blurred vision, double vision, floaters, etc.)     Blurred vision  2. LOCATION: "One or both eyes?" If one, ask:  "Which eye?"     Both eyes 3. SEVERITY: "Can you see anything?" If Yes, ask: "What can you see?" (e.g., fine print)     Can see 3 inches in front of me  4. ONSET: "When did this begin?" "Did it start suddenly or has this been gradual?"     Thursday morning  5. PATTERN: "Does this come and go, or has it been constant since it started?"     Comes and goes  6. PAIN: "Is there any pain in your eye(s)?"  (Scale 1-10; or mild, moderate, severe)   - NONE (0): No pain.   - MILD (1-3): Doesn't interfere with normal activities.   - MODERATE (4-7): Interferes with normal activities or awakens from sleep.    - SEVERE (8-10): Excruciating pain, unable to do any normal activities.     Mild sharp pain like sinus pain behind eyes and at the back of my neck lower head 4/10 7. CONTACTS-GLASSES: "Do you wear contacts or glasses?"     Glasses  8. CAUSE: "What do you think is causing this visual problem?"     Possible medication reaction  9. OTHER SYMPTOMS: "Do you have any other symptoms?" (e.g., confusion, headache, arm or leg weakness, speech problems)     Mild headache  Fostering a cat but is allergic to cats - usually only experiences itchiness cetirizine hcl 10 mg  Light sensitivity - head hurts more with light  10. PREGNANCY: "Is there any chance you are pregnant?" "When was your last menstrual period?"       No - tubes tied 3 years ago  Protocols used: Vision Loss or Change-A-AH

## 2023-06-05 NOTE — Discharge Instructions (Signed)
Follow-up with ophthalmology as planned.  I think it is most likely due to the Topamax.  Stop the Topamax.

## 2023-06-08 ENCOUNTER — Other Ambulatory Visit: Payer: Self-pay | Admitting: Family Medicine

## 2023-07-01 ENCOUNTER — Ambulatory Visit: Payer: BC Managed Care – PPO | Admitting: Family Medicine

## 2023-07-01 ENCOUNTER — Encounter: Payer: Self-pay | Admitting: Family Medicine

## 2023-07-01 VITALS — BP 108/75 | HR 81 | Temp 98.6°F | Ht 62.0 in | Wt 190.0 lb

## 2023-07-01 DIAGNOSIS — T887XXA Unspecified adverse effect of drug or medicament, initial encounter: Secondary | ICD-10-CM | POA: Diagnosis not present

## 2023-07-01 DIAGNOSIS — E6609 Other obesity due to excess calories: Secondary | ICD-10-CM | POA: Insufficient documentation

## 2023-07-01 DIAGNOSIS — E66811 Obesity, class 1: Secondary | ICD-10-CM

## 2023-07-01 DIAGNOSIS — Z6834 Body mass index (BMI) 34.0-34.9, adult: Secondary | ICD-10-CM | POA: Diagnosis not present

## 2023-07-01 NOTE — Progress Notes (Signed)
 Office: [phone]  /  Fax: [phone]  WEIGHT SUMMARY AND BIOMETRICS  Starting Date: 02/25/23  Starting Weight: 186lb   Weight Lost Since Last Visit: 0lb   Vitals Temp: 98.6 F (37 C) BP: 108/75 Pulse Rate: 81 SpO2: 99 %   Body Composition  Body Fat %: 40 % Fat Mass (lbs): 76 lbs Muscle Mass (lbs): 108.2 lbs Total Body Water (lbs): 75.2 lbs Visceral Fat Rating : 9    HPI  Chief Complaint: OBESITY  Allison Fischer is here to discuss her progress with her obesity treatment plan. She is on the the Category 3 Plan and states she is following her eating plan approximately 20 % of the time. She states she is exercising 0 minutes 0 times per week.   Interval History:  Since last office visit she is down 0 lb She did have to stop topiramate  due to blurry vision that started the day she started it She did go to the ER and the ophthalmologist Her IOP was normal and she was started on eye drops for dry eyes She has been off of Phentermine  She plans to start using her treadmill She has been mindful of her food choices She has a net weight gain of 4 lb in the past 4 mos  Barriers to progress include: a 45 min commute to work 5 days/ wk Kid's activities in the evenings Over- consuming bread on regular basis (4 pieces per day) Limited time to add in home workouts  Pharmacotherapy: none  PHYSICAL EXAM:  Blood pressure 108/75, pulse 81, temperature 98.6 F (37 C), height 5' 2 (1.575 m), weight 190 lb (86.2 kg), last menstrual period 05/11/2023, SpO2 99%. Body mass index is 34.75 kg/m.  General: She is overweight, cooperative, alert, well developed, and in no acute distress. PSYCH: Has normal mood, affect and thought process.   Lungs: Normal breathing effort, no conversational dyspnea.   ASSESSMENT AND PLAN  TREATMENT PLAN FOR OBESITY:  Recommended Dietary Goals  Wonder is currently in the action stage of change. As such, her goal is to continue weight management  plan. She has agreed to keeping a food journal and adhering to recommended goals of 1500 calories and 80 g of  protein.  Behavioral Intervention  We discussed the following Behavioral Modification Strategies today: increasing lean protein intake to established goals, increasing water intake , work on meal planning and preparation, work on tracking and journaling calories using tracking application, keeping healthy foods at home, identifying sources and decreasing liquid calories, planning for success, and continue to work on maintaining a reduced calorie state, getting the recommended amount of protein, incorporating whole foods, making healthy choices, staying well hydrated and practicing mindfulness when eating..  Additional resources provided today: NA  Recommended Physical Activity Goals  Leeona has been advised to work up to 150 minutes of moderate intensity aerobic activity a week and strengthening exercises 2-3 times per week for cardiovascular health, weight loss maintenance and preservation of muscle mass.   She has agreed to [employer] strengthening exercises with a goal of 2-3 sessions a week  - home treadmill use / resistance training from home with a goal of 2+ days/ wk  Pharmacotherapy changes for the treatment of obesity: none  ASSOCIATED CONDITIONS ADDRESSED TODAY  Medication side effect Assessment & Plan: ER notes reviewed.  Had blurry vision from new start Topiramate  which has resolved off medication.  Eye exam was updated and IOP was normal.  She also remains off of Phentermine  which can  also cause blurry vision and dry eyes.    Will keep her off both medications   Class 1 obesity due to excess calories without serious comorbidity with body mass index (BMI) of 34.0 to 34.9 in adult Assessment & Plan: Stable Unable to take AOMs due to previous adverse SE and lack of coverage for newer agents She is actively working on dietary logging, mindful eating, meal prep, eating on a  schedule, limiting sugar intake and finding ways to increase physical activity  Barriers reviewed above Reviewed plan of care on AVS       She was informed of the importance of frequent follow up visits to maximize her success with intensive lifestyle modifications for her multiple health conditions.   ATTESTASTION STATEMENTS:  Reviewed by clinician on day of visit: allergies, medications, problem list, medical history, surgical history, family history, social history, and previous encounter notes pertinent to obesity diagnosis.   I have personally spent 30 minutes total time today in preparation, patient care, nutritional counseling and documentation for this visit, including the following: review of clinical lab tests; review of medical tests/procedures/services.      Darice FORBES Haddock, DO DABFM, DABOM [facility] [address] [place], [place] [address] [phone]

## 2023-07-01 NOTE — Assessment & Plan Note (Signed)
 ER notes reviewed.  Had blurry vision from new start Topiramate  which has resolved off medication.  Eye exam was updated and IOP was normal.  She also remains off of Phentermine  which can also cause blurry vision and dry eyes.    Will keep her off both medications

## 2023-07-01 NOTE — Assessment & Plan Note (Signed)
 Stable Unable to take AOMs due to previous adverse SE and lack of coverage for newer agents She is actively working on dietary logging, mindful eating, meal prep, eating on a schedule, limiting sugar intake and finding ways to increase physical activity  Barriers reviewed above Reviewed plan of care on AVS

## 2023-07-01 NOTE — Patient Instructions (Addendum)
 Bitesnap: Counts calories and nutrients by taking a picture of your meal  SnapCalorie: Provides an accurate calorie count and nutrition for a meal by taking a picture  FoodView: A free app that allows you to keep a photo food diary  Foodvisor: Provides detailed nutritional information by taking a picture or scanning a barcode  Calorie Mama: Provides nutritional information for a meal by taking a picture  See How You Eat: Uses photos to create a visual food diary   Aim for 1400-1500 cal/ day This should include 75+ g of protein daily Don't add your workouts into your ap  Try out [employer] breads/ baking mixes  Watch out for hidden sugar Avoid products with > 8 g of added sugar per serving  Aim for more walking/ home workouts at least 2 days/ wk

## 2023-07-21 IMAGING — CT CT ANGIO CHEST
3 of 9 series · 18 of 36 positions shown · IV contrast (Omnipaque)
Comparison: Chest XR, earlier same day.  CT AP, 09/28/2017.

CLINICAL DATA: Positive D-dimer.  Postsurgical tachycardia.

EXAM:
CT ANGIOGRAPHY CHEST WITH CONTRAST
TECHNIQUE: Multidetector CT imaging of the chest was performed using the
standard protocol during bolus administration of intravenous
contrast. Multiplanar CT image reconstructions and MIPs were
obtained to evaluate the vascular anatomy.

[Series 5: pe lung · axial · 0.74mm/px · z∈[+1020,+1116]mm · 2 of 97 slices shown]
[im 33/97  mediastinal]
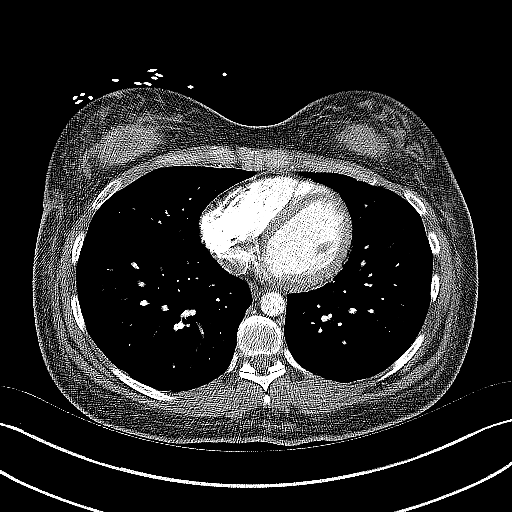
[im 65/97  mediastinal]
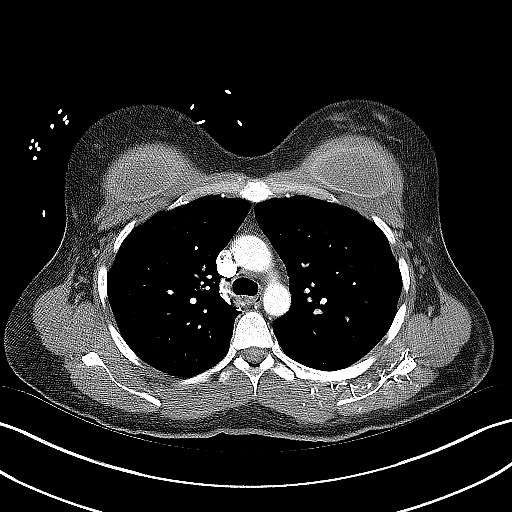

[Series 6: pe thins · axial · 0.75mm/px · z∈[+906,+1191]mm · 15 of 327 slices shown]
[im 21/327  lung]
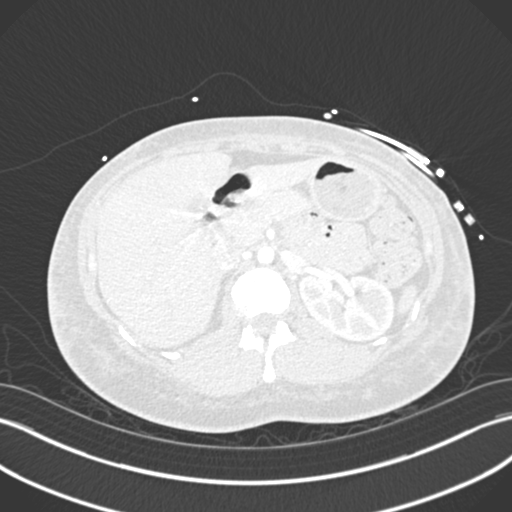
[im 41/327  mediastinal]
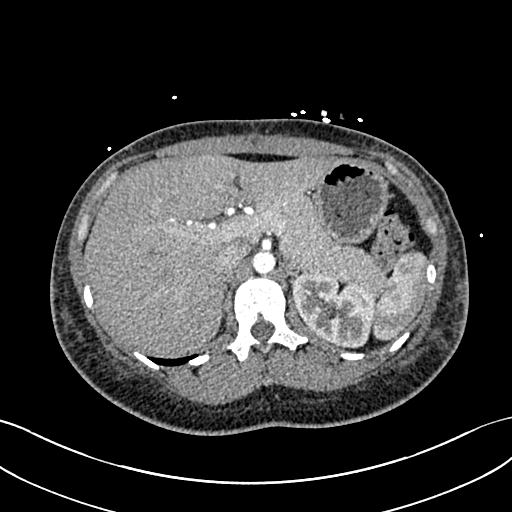
[im 62/327  lung]
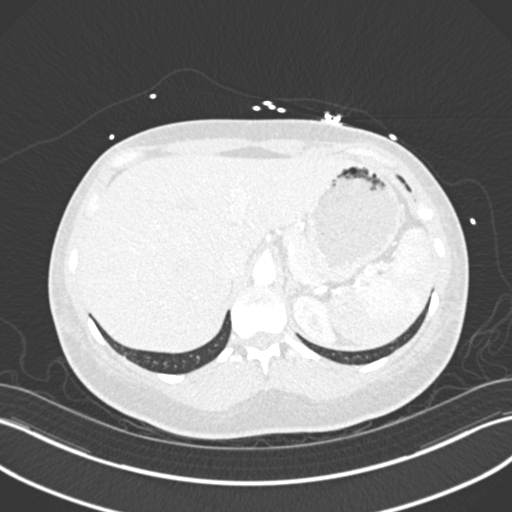
[im 82/327  mediastinal]
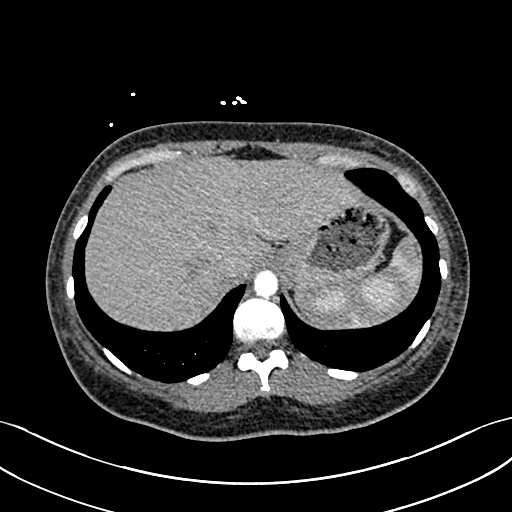
[im 102/327  lung]
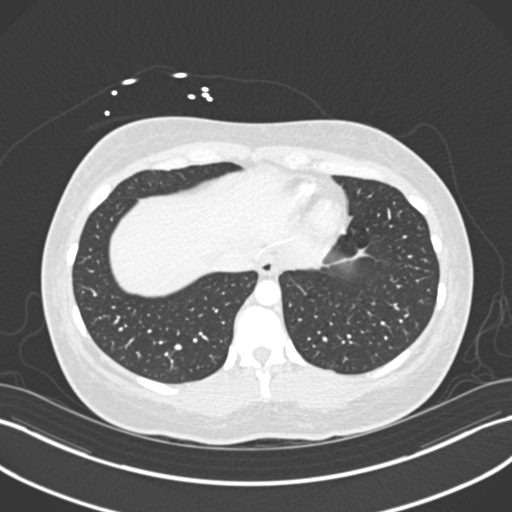
[im 123/327  mediastinal]
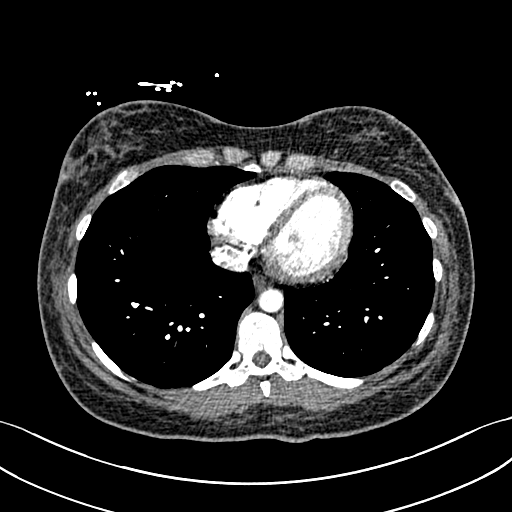
[im 143/327  lung]
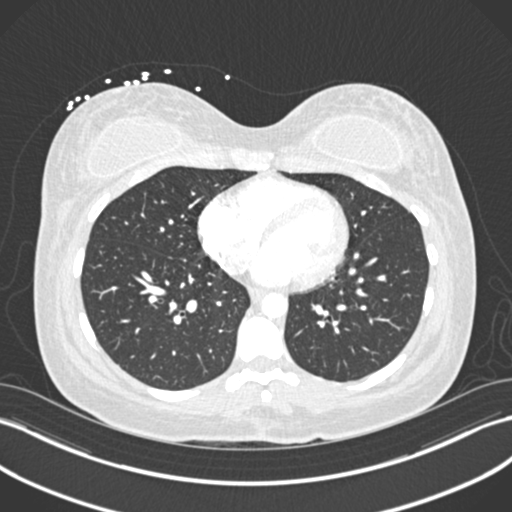
[im 164/327  mediastinal]
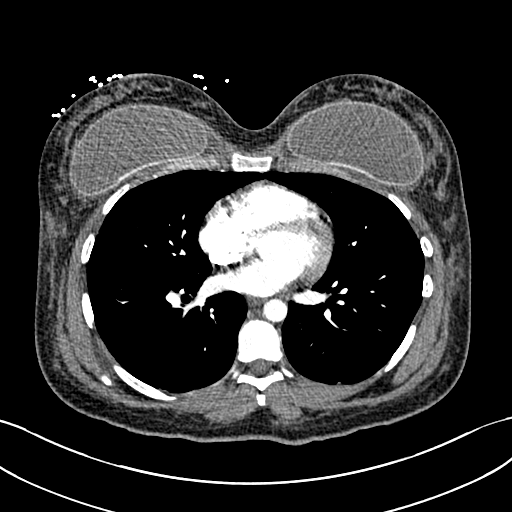
[im 184/327  lung]
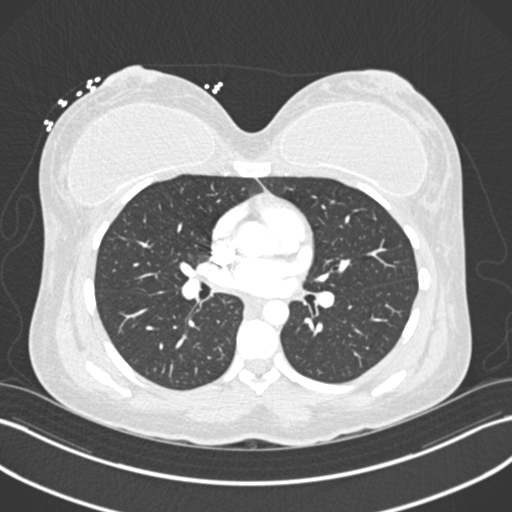
[im 204/327  mediastinal]
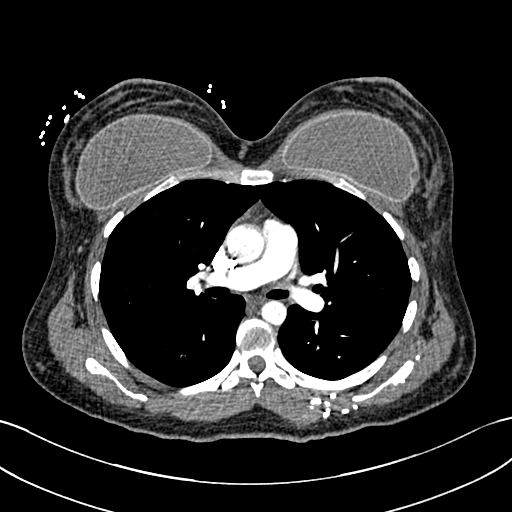
[im 225/327  lung]
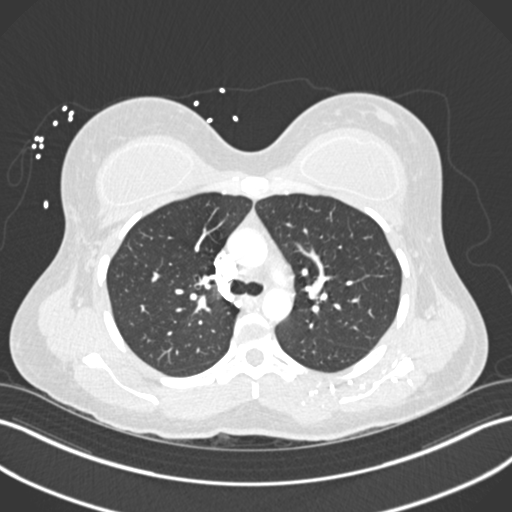
[im 245/327  mediastinal]
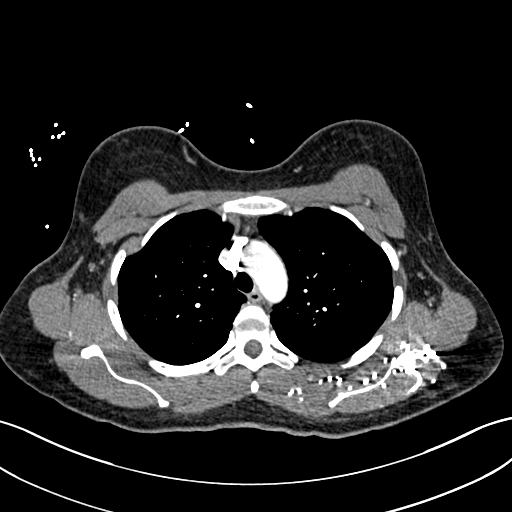
[im 265/327  lung]
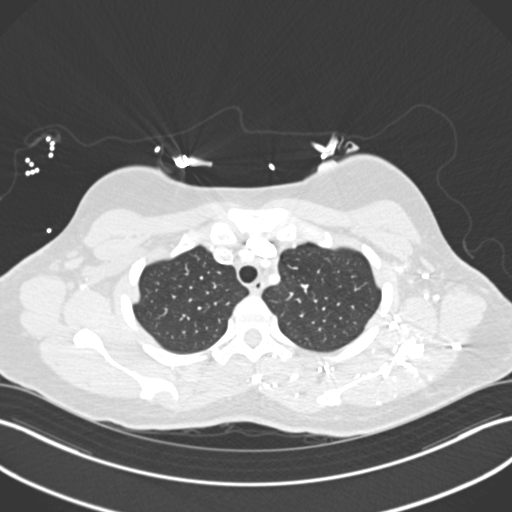
[im 286/327  mediastinal]
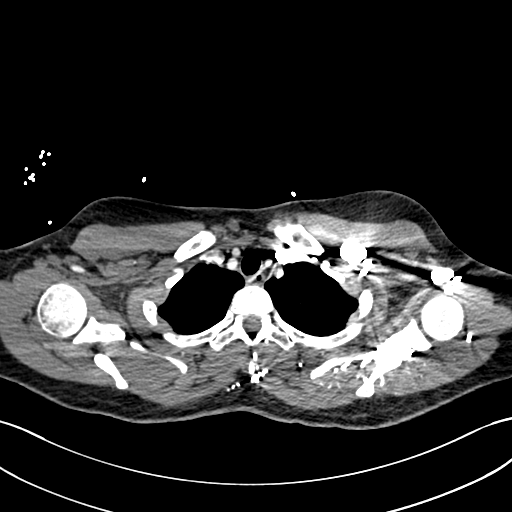
[im 306/327  lung]
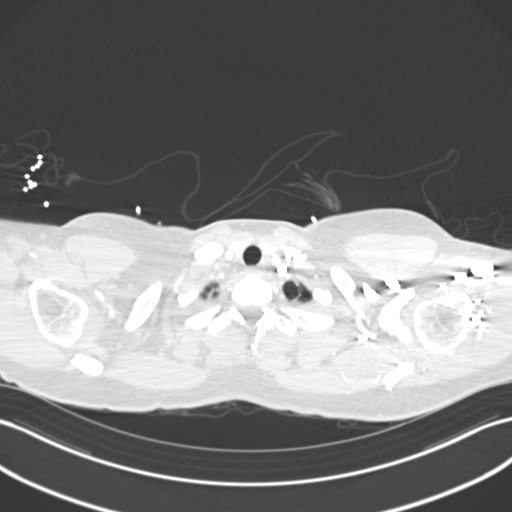

[Series 7: pe coronal mpr · coronal · 0.66mm/px · 1 of 118 slices shown]
[im 59/118  mediastinal]
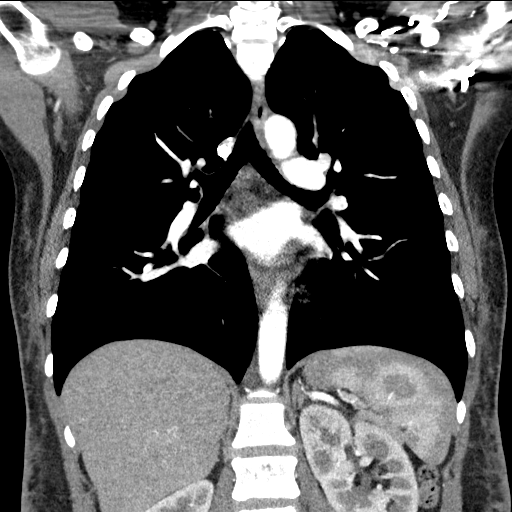

[18 of 36 positions shown; findings below may reference images not displayed]

RADIATION DOSE REDUCTION: This exam was performed according to the
departmental dose-optimization program which includes automated
exposure control, adjustment of the mA and/or kV according to
patient size and/or use of iterative reconstruction technique.

CONTRAST:  100mL OMNIPAQUE IOHEXOL 350 MG/ML SOLN
FINDINGS: Cardiovascular: Satisfactory opacification of the pulmonary arteries
to the segmental level. No segmental or larger pulmonary embolus.

Normal heart size. No pericardial effusion.

Mediastinum/Nodes: No enlarged mediastinal, hilar, or axillary lymph
nodes. Thyroid gland, trachea, and esophagus demonstrate no
significant findings.

Lungs/Pleura: Lungs are clear. No pleural effusion or pneumothorax.

Upper Abdomen: No acute abnormality. Mottled appearance of spleen,
favored secondary to phase.

Musculoskeletal: Bilateral breast implant augmentation. Abdominal
and flank subcutaneous edema. No acute osseous findings.

Review of the MIP images confirms the above findings.
IMPRESSION: 1. No segmental or larger pulmonary embolus. No acute intrathoracic
abnormality.
2. Abdominal and flank subcutaneous edema.

## 2023-07-29 ENCOUNTER — Ambulatory Visit: Payer: BC Managed Care – PPO | Admitting: Family Medicine

## 2023-08-19 ENCOUNTER — Encounter: Payer: Self-pay | Admitting: Family Medicine

## 2023-08-19 ENCOUNTER — Ambulatory Visit: Payer: BC Managed Care – PPO | Admitting: Family Medicine

## 2023-08-19 VITALS — BP 93/65 | HR 107 | Temp 98.0°F | Ht 62.0 in | Wt 194.0 lb

## 2023-08-19 DIAGNOSIS — R4589 Other symptoms and signs involving emotional state: Secondary | ICD-10-CM | POA: Diagnosis not present

## 2023-08-19 DIAGNOSIS — E66812 Obesity, class 2: Secondary | ICD-10-CM

## 2023-08-19 DIAGNOSIS — Z6835 Body mass index (BMI) 35.0-35.9, adult: Secondary | ICD-10-CM

## 2023-08-19 DIAGNOSIS — R632 Polyphagia: Secondary | ICD-10-CM

## 2023-08-19 DIAGNOSIS — E6609 Other obesity due to excess calories: Secondary | ICD-10-CM

## 2023-08-19 NOTE — Progress Notes (Unsigned)
 Office: [phone]  /  Fax: [phone]  WEIGHT SUMMARY AND BIOMETRICS  Starting Date: 02/25/23  Starting Weight: 186lb   Weight Lost Since Last Visit: 0lb   Vitals Temp: 98 F (36.7 C) BP: 93/65 Pulse Rate: (!) 107 SpO2: 99 %   Body Composition  Body Fat %: 40.7 % Fat Mass (lbs): 79.2 lbs Muscle Mass (lbs): 109.6 lbs Total Body Water (lbs): 76.8 lbs Visceral Fat Rating : 9    HPI  Chief Complaint: OBESITY  Allison Fischer is here to discuss her progress with her obesity treatment plan. She is on the the Category 3 Plan and states she is following her eating plan approximately 10 % of the time. She states she is exercising 0 minutes 0 times per week.  Interval History:  Since last office visit she is up 4 lb This gives her a net weight gain of 8 lb in the past 5 mos of medically supervised weight management She has been struggling more with depressed mood and emotional eating Stress is mostly coming from work schedule, job stress and balancing work and family Her mom has been cooking dinner most nights.  She c/o feeling very hungry often overeating at meals She is not a big snacker She craves more bread vs sweets She has previously had blurry vision from topiramate and failed to see improvements on phentermine or Wellbutrin  Pharmacotherapy: none  PHYSICAL EXAM:  Blood pressure 93/65, pulse (!) 107, temperature 98 F (36.7 C), height 5\' 2"  (1.575 m), weight 194 lb (88 kg), SpO2 99%. Body mass index is 35.48 kg/m.  General: She is overweight, cooperative, alert, well developed, and in no acute distress. PSYCH: Has normal mood, affect and thought process.   Lungs: Normal breathing effort, no conversational dyspnea.   ASSESSMENT AND PLAN  TREATMENT PLAN FOR OBESITY:  Recommended Dietary Goals  Allison Fischer is currently in the action stage of change. As such, her goal is to continue weight management plan. She has agreed to keeping a food journal and adhering to  recommended goals of 1500 calories and 100 g of protein.  Behavioral Intervention  We discussed the following Behavioral Modification Strategies today: increasing lean protein intake to established goals, increasing fiber rich foods, avoiding skipping meals, increasing water intake , work on meal planning and preparation, work on Counselling psychologist calories using tracking application, keeping healthy foods at home, work on managing stress, creating time for self-care and relaxation, avoiding temptations and identifying enticing environmental cues, planning for success, and continue to work on maintaining a reduced calorie state, getting the recommended amount of protein, incorporating whole foods, making healthy choices, staying well hydrated and practicing mindfulness when eating..  Additional resources provided today: NA  Recommended Physical Activity Goals  Allison Fischer has been advised to work up to 150 minutes of moderate intensity aerobic activity a week and strengthening exercises 2-3 times per week for cardiovascular health, weight loss maintenance and preservation of muscle mass.   She has agreed to Start aerobic activity with a goal of 150 minutes a week at moderate intensity.   Pharmacotherapy changes for the treatment of obesity: None  ASSOCIATED CONDITIONS ADDRESSED TODAY  Polyphagia Worsening despite eating all 3 meals and getting in more lean protein and fiber  Mood / stress contributing to hunger and cravings Has had SE from Topirmate and failed to see weight loss while on Wellbutrin or phentermine. She lacks insurance coverage for Saxenda, Wegovy and Zepbound  We discussed tracking caloric intake to make sure  she is hitting her target goal of calories and protein daily as well as working on stress reduction, fluid hydration.  We discussed use of compounded GLP-1's prescribed outside of our practice with risk versus benefit.  Class 2 obesity due to excess calories with body  mass index (BMI) of 35.0 to 35.9 in adult, unspecified whether serious comorbidity present  Depressed mood Mood changes related to job stress and worklife balance.  She has a good support system at home with her husband, kids and her mom.  She has been craving more breads.  Sleep has been fair.  Recommend talking with her PCP about any mood changes while working on stress reduction, proper nutrition and regular exercise.     She was informed of the importance of frequent follow up visits to maximize her success with intensive lifestyle modifications for her multiple health conditions.   ATTESTASTION STATEMENTS:  Reviewed by clinician on day of visit: allergies, medications, problem list, medical history, surgical history, family history, social history, and previous encounter notes pertinent to obesity diagnosis.   I have personally spent 30 minutes total time today in preparation, patient care, nutritional counseling and documentation for this visit, including the following: review of clinical lab tests; review of medical tests/procedures/services.      Allison Brink, DO DABFM, DABOM [facility] [address] [place], [place] [address] [phone]

## 2023-09-21 ENCOUNTER — Ambulatory Visit: Payer: BC Managed Care – PPO | Admitting: Family Medicine

## 2023-09-21 ENCOUNTER — Encounter: Payer: Self-pay | Admitting: Family Medicine

## 2023-09-21 VITALS — BP 103/70 | HR 94 | Temp 98.6°F | Ht 62.0 in | Wt 197.0 lb

## 2023-09-21 DIAGNOSIS — E66812 Obesity, class 2: Secondary | ICD-10-CM | POA: Diagnosis not present

## 2023-09-21 DIAGNOSIS — Z6836 Body mass index (BMI) 36.0-36.9, adult: Secondary | ICD-10-CM | POA: Diagnosis not present

## 2023-09-21 MED ORDER — TIRZEPATIDE-WEIGHT MANAGEMENT 2.5 MG/0.5ML ~~LOC~~ SOLN: 2.5000 mg | SUBCUTANEOUS | 0 refills | Status: DC

## 2023-09-21 NOTE — Progress Notes (Signed)
 Office: [phone]  /  Fax: [phone]  WEIGHT SUMMARY AND BIOMETRICS  Starting Date: 02/25/23  Starting Weight: 186lb   Weight Lost Since Last Visit: 0lb   Vitals Temp: 98.6 F (37 C) BP: 103/70 Pulse Rate: 94 SpO2: 100 %   Body Composition  Body Fat %: 41.4 % Fat Mass (lbs): 81.6 lbs Muscle Mass (lbs): 109.6 lbs Total Body Water (lbs): 77.6 lbs Visceral Fat Rating : 9     HPI  Chief Complaint: OBESITY  Allison Fischer is here to discuss her progress with her obesity treatment plan. She is on the the Category 3 Plan and states she is following her eating plan approximately 80 % of the time. She states she is exercising 0 minutes 0 times per week.   Interval History:  Since last office visit she is up 3 pounds Lean body mass remains unchanged, body fat is up 2.4 pounds This gives her a net weight gain of 11 pounds in the past 6 months of medically supervised weight management She has decided to start Zepbound cash pay Her family has been supportive She has been working on category 3 meal plan She has not been consistent with exercise   Pharmacotherapy: None  PHYSICAL EXAM:  Blood pressure 103/70, pulse 94, temperature 98.6 F (37 C), height 5\' 2"  (1.575 m), weight 197 lb (89.4 kg), SpO2 100%. Body mass index is 36.03 kg/m.  General: She is overweight, cooperative, alert, well developed, and in no acute distress. PSYCH: Has normal mood, affect and thought process.   Lungs: Normal breathing effort, no conversational dyspnea.   ASSESSMENT AND PLAN  TREATMENT PLAN FOR OBESITY:  Recommended Dietary Goals  Allison Fischer is currently in the action stage of change. As such, her goal is to continue weight management plan. She has agreed to the Category 3 Plan.  Behavioral Intervention  We discussed the following Behavioral Modification Strategies today: Normal.  Additional resources provided today: NA  Recommended Physical Activity Goals  Allison Fischer has been  advised to work up to 150 minutes of moderate intensity aerobic activity a week and strengthening exercises 2-3 times per week for cardiovascular health, weight loss maintenance and preservation of muscle mass.   She has agreed to Start aerobic activity with a goal of 150 minutes a week at moderate intensity.   Pharmacotherapy changes for the treatment of obesity: Begin Zepbound 2.5 mg once weekly injection  ASSOCIATED CONDITIONS ADDRESSED TODAY  Obesity, Class II, BMI 35-39.9, no comorbidity -     Tirzepatide-Weight Management; Inject 2.5 mg into the skin once a week.  Dispense: 2 mL; Refill: 0 Patient has failed to lose weight with medically supervised weight management over the past 6 months of medically supervised weight management.  She has been working on compliance on the category 3 meal plan with a plan to increase regular exercise.  She has previously failed to see weight loss with Wellbutrin, phentermine.  She is a good candidate for use of Zepbound.  We have discussed mechanism of action and potential for side effects.  She has no personal or family history for pancreatitis, medullary thyroid carcinoma or multiple endocrine neoplasia type II.  Reviewed Zepbound website for further information.     She was informed of the importance of frequent follow up visits to maximize her success with intensive lifestyle modifications for her multiple health conditions.   ATTESTASTION STATEMENTS:  Reviewed by clinician on day of visit: allergies, medications, problem list, medical history, surgical history, family history, social history, and  previous encounter notes pertinent to obesity diagnosis.   I have personally spent 30 minutes total time today in preparation, patient care, nutritional counseling and documentation for this visit, including the following: review of clinical lab tests; review of medical tests/procedures/services.      Glennis Brink, DO DABFM, DABOM [facility] [address] [place], [place] [address] [phone]

## 2023-10-08 ENCOUNTER — Other Ambulatory Visit: Payer: Self-pay | Admitting: Family Medicine

## 2023-10-08 DIAGNOSIS — E66812 Obesity, class 2: Secondary | ICD-10-CM

## 2023-10-20 ENCOUNTER — Encounter: Payer: Self-pay | Admitting: Family Medicine

## 2023-10-20 ENCOUNTER — Ambulatory Visit: Admitting: Family Medicine

## 2023-10-20 VITALS — BP 110/74 | HR 77 | Temp 97.9°F | Ht 62.0 in | Wt 194.0 lb

## 2023-10-20 DIAGNOSIS — E66812 Obesity, class 2: Secondary | ICD-10-CM

## 2023-10-20 DIAGNOSIS — Z6835 Body mass index (BMI) 35.0-35.9, adult: Secondary | ICD-10-CM | POA: Diagnosis not present

## 2023-10-20 DIAGNOSIS — R632 Polyphagia: Secondary | ICD-10-CM | POA: Diagnosis not present

## 2023-10-20 MED ORDER — TIRZEPATIDE-WEIGHT MANAGEMENT 5 MG/0.5ML ~~LOC~~ SOLN: 5.0000 mg | SUBCUTANEOUS | 0 refills | Status: DC

## 2023-10-20 NOTE — Progress Notes (Signed)
 Office: [phone]  /  Fax: [phone]  WEIGHT SUMMARY AND BIOMETRICS  Starting Date: 02/25/23  Starting Weight: 186lb   Weight Lost Since Last Visit: 3lb   Vitals Temp: 97.9 F (36.6 C) BP: 110/74 Pulse Rate: 77 SpO2: 100 %   Body Composition  Body Fat %: 41 % Fat Mass (lbs): 79.6 lbs Muscle Mass (lbs): 108.6 lbs Total Body Water (lbs): 76.8 lbs Visceral Fat Rating : 9   HPI  Chief Complaint: OBESITY  Allison Fischer is here to discuss her progress with her obesity treatment plan. She is on the the Category 3 Plan and states she is following her eating plan approximately 50 % of the time. She states she is exercising 20 minutes 2 times per week.  Interval History:  Since last office visit she is down 3 lb This gives her a net weight gain of 8 lb in 7 mos She is down 1 lb of muscle mass and down 2 lb of body fat She has done 4 injections of Zepbound 2.5 mg (cash pay) She is liking improved satiety on Zepbound without adverse SE She has been looking at 1500 cal meal plans on [employer] She has been walking 2 x a week with her husband She is eating out less at work  Pharmacotherapy: Zepbound 2.5 mg weekly  PHYSICAL EXAM:  Blood pressure 110/74, pulse 77, temperature 97.9 F (36.6 C), height 5\' 2"  (1.575 m), weight 194 lb (88 kg), SpO2 100%. Body mass index is 35.48 kg/m.  General: She is overweight, cooperative, alert, well developed, and in no acute distress. PSYCH: Has normal mood, affect and thought process.   Lungs: Normal breathing effort, no conversational dyspnea.   ASSESSMENT AND PLAN  TREATMENT PLAN FOR OBESITY:  Recommended Dietary Goals  Allison Fischer is currently in the action stage of change. As such, her goal is to continue weight management plan. She has agreed to the Category 3 Plan and keeping a food journal and adhering to recommended goals of 1500 calories and 90+ g of  protein.  Behavioral Intervention  We discussed the following Behavioral  Modification Strategies today: increasing lean protein intake to established goals, increasing fiber rich foods, avoiding skipping meals, increasing water intake , work on meal planning and preparation, keeping healthy foods at home, identifying sources and decreasing liquid calories, practice mindfulness eating and understand the difference between hunger signals and cravings, work on managing stress, creating time for self-care and relaxation, avoiding temptations and identifying enticing environmental cues, and continue to work on maintaining a reduced calorie state, getting the recommended amount of protein, incorporating whole foods, making healthy choices, staying well hydrated and practicing mindfulness when eating..  Additional resources provided today: NA  Recommended Physical Activity Goals  Allison Fischer has been advised to work up to 150 minutes of moderate intensity aerobic activity a week and strengthening exercises 2-3 times per week for cardiovascular health, weight loss maintenance and preservation of muscle mass.   She has agreed to Increase the intensity, frequency or duration of aerobic exercises    Pharmacotherapy changes for the treatment of obesity: increase Zepbound to 5 mg weekly injection ([employer])  ASSOCIATED CONDITIONS ADDRESSED TODAY  Polyphagia Improved on Zepbound 2.5 mg weekly x 4 weeks without nausea, heartburn or constipation She is enjoying improved satiety and is mindful of getting in smaller meals with lean protein and fiber.    Hydrate well with water with a goal of >64 oz/ day Aim for 90+ g of protein intake daily  Increase Zepbound to 5 mg weekly  Obesity, Class II, BMI 35-39.9, no comorbidity -     Tirzepatide-Weight Management; Inject 5 mg into the skin once a week.  Dispense: 2 mL; Refill: 0      She was informed of the importance of frequent follow up visits to maximize her success with intensive lifestyle modifications for her multiple health  conditions.   ATTESTASTION STATEMENTS:  Reviewed by clinician on day of visit: allergies, medications, problem list, medical history, surgical history, family history, social history, and previous encounter notes pertinent to obesity diagnosis.   I have personally spent 20  minutes total time today in preparation, patient care, nutritional counseling and education,  and documentation for this visit, including the following: review of most recent clinical lab tests, prescribing medications/ refilling medications, reviewing medical assistant documentation, review and interpretation of bioimpedence results.     Allison Fischer, D.O. DABFM, DABOM [facility] [address] [place], [place] [address] [phone]

## 2023-11-05 ENCOUNTER — Other Ambulatory Visit: Payer: Self-pay | Admitting: Family Medicine

## 2023-11-05 DIAGNOSIS — E66812 Obesity, class 2: Secondary | ICD-10-CM

## 2023-11-11 ENCOUNTER — Encounter: Payer: Self-pay | Admitting: Family Medicine

## 2023-11-11 ENCOUNTER — Ambulatory Visit: Admitting: Family Medicine

## 2023-11-11 VITALS — BP 114/82 | HR 109 | Temp 98.4°F | Ht 62.0 in | Wt 190.0 lb

## 2023-11-11 DIAGNOSIS — E6609 Other obesity due to excess calories: Secondary | ICD-10-CM

## 2023-11-11 DIAGNOSIS — Z6834 Body mass index (BMI) 34.0-34.9, adult: Secondary | ICD-10-CM

## 2023-11-11 DIAGNOSIS — R1013 Epigastric pain: Secondary | ICD-10-CM | POA: Diagnosis not present

## 2023-11-11 DIAGNOSIS — E66811 Obesity, class 1: Secondary | ICD-10-CM | POA: Diagnosis not present

## 2023-11-11 MED ORDER — TIRZEPATIDE-WEIGHT MANAGEMENT 5 MG/0.5ML ~~LOC~~ SOLN: 5.0000 mg | SUBCUTANEOUS | 0 refills | Status: DC

## 2023-11-11 NOTE — Progress Notes (Signed)
 Office: [phone]  /  Fax: [phone]  WEIGHT SUMMARY AND BIOMETRICS  Starting Date: 02/25/23  Starting Weight: 186lb   Weight Lost Since Last Visit: 4lb   Vitals Temp: 98.4 F (36.9 C) BP: 114/82 Pulse Rate: (!) 109 SpO2: 99 %   Body Composition  Body Fat %: 40.4 % Fat Mass (lbs): 76.8 lbs Muscle Mass (lbs): 107.4 lbs Total Body Water (lbs): 75.6 lbs Visceral Fat Rating : 9    HPI  Chief Complaint: OBESITY  Allison Fischer is here to discuss her progress with her obesity treatment plan. She is on the the Category 3 Plan and states she is following her eating plan approximately 80 % of the time. She states she is exercising 30 minutes 3 times per week.  Interval History:  Since last office visit she is down 4 lb She did go up on Zebpound to 5 mg weekly via [employer] x 3 weeks She is prioritizing lean protein intake She is getting in some fruits and veggies She is eating out less often She denies meal skipping or nausea She has had some sour burps and dyspepsia 1-2 days post injection She has been walking more and plans to start to the gym  Pharmacotherapy: Zepbound 5 mg weekly  PHYSICAL EXAM:  Blood pressure 114/82, pulse (!) 109, temperature 98.4 F (36.9 C), height 5\' 2"  (1.575 m), weight 190 lb (86.2 kg), SpO2 99%. Body mass index is 34.75 kg/m.  General: She is overweight, cooperative, alert, well developed, and in no acute distress. PSYCH: Has normal mood, affect and thought process.   Lungs: Normal breathing effort, no conversational dyspnea.   ASSESSMENT AND PLAN  TREATMENT PLAN FOR OBESITY:  Recommended Dietary Goals  Allison Fischer is currently in the action stage of change. As such, her goal is to continue weight management plan. She has agreed to the Category 3 Plan and keeping a food journal and adhering to recommended goals of 1300-1500 calories and 90 g of  protein.  Behavioral Intervention  We discussed the following Behavioral Modification  Strategies today: increasing lean protein intake to established goals, increasing fiber rich foods, avoiding skipping meals, increasing water intake , work on meal planning and preparation, keeping healthy foods at home, identifying sources and decreasing liquid calories, practice mindfulness eating and understand the difference between hunger signals and cravings, work on managing stress, creating time for self-care and relaxation, avoiding temptations and identifying enticing environmental cues, continue to practice mindfulness when eating, and continue to work on maintaining a reduced calorie state, getting the recommended amount of protein, incorporating whole foods, making healthy choices, staying well hydrated and practicing mindfulness when eating..  Additional resources provided today: NA  Recommended Physical Activity Goals  Allison Fischer has been advised to work up to 150 minutes of moderate intensity aerobic activity a week and strengthening exercises 2-3 times per week for cardiovascular health, weight loss maintenance and preservation of muscle mass.   She has agreed to Start aerobic activity with a goal of 150 minutes a week at moderate intensity.   Pharmacotherapy changes for the treatment of obesity: none  ASSOCIATED CONDITIONS ADDRESSED TODAY  Dyspepsia New, worsened by use of Zepbound 5 mg weekly Avoids over- consuming at mealtime and is working on avoiding late night eating and high acid foods.  Denies feelings of heartburn, nausea or abdominal pain but has sour burps  Explained delayed gastric emptying from medication She can use OTC Pepcid AC prn Avoid over - filling at mealtime  Class 1  obesity due to excess calories with body mass index (BMI) of 34.0 to 34.9 in adult, unspecified whether serious comorbidity present -     Tirzepatide-Weight Management; Inject 5 mg into the skin once a week.  Dispense: 2 mL; Refill: 0 Weight loss on pace with avg 1 lb per week, maintaining most  of her lean body mass Plan to add in more walking and gym workouts this next month Aim for 90 g of protein intake daily Doing well with mindful eating, improved food choices and smaller portion sizes     She was informed of the importance of frequent follow up visits to maximize her success with intensive lifestyle modifications for her multiple health conditions.   ATTESTASTION STATEMENTS:  Reviewed by clinician on day of visit: allergies, medications, problem list, medical history, surgical history, family history, social history, and previous encounter notes pertinent to obesity diagnosis.   I have personally spent 30 minutes total time today in preparation, patient care, nutritional counseling and education,  and documentation for this visit, including the following: review of most recent clinical lab tests, prescribing medications/ refilling medications, reviewing medical assistant documentation, review and interpretation of bioimpedence results.     Micky Albee, D.O. DABFM, DABOM [facility] [address] [place], [place] [address] [phone]

## 2023-11-27 ENCOUNTER — Other Ambulatory Visit: Payer: Self-pay | Admitting: Family Medicine

## 2023-11-27 DIAGNOSIS — E6609 Other obesity due to excess calories: Secondary | ICD-10-CM

## 2023-12-10 ENCOUNTER — Encounter: Payer: Self-pay | Admitting: Family Medicine

## 2023-12-10 ENCOUNTER — Ambulatory Visit: Admitting: Family Medicine

## 2023-12-10 VITALS — BP 93/67 | HR 85 | Temp 98.5°F | Ht 62.0 in | Wt 190.0 lb

## 2023-12-10 DIAGNOSIS — R632 Polyphagia: Secondary | ICD-10-CM

## 2023-12-10 DIAGNOSIS — Z6834 Body mass index (BMI) 34.0-34.9, adult: Secondary | ICD-10-CM

## 2023-12-10 DIAGNOSIS — E66811 Obesity, class 1: Secondary | ICD-10-CM

## 2023-12-10 MED ORDER — TIRZEPATIDE-WEIGHT MANAGEMENT 7.5 MG/0.5ML ~~LOC~~ SOLN: 7.5000 mg | SUBCUTANEOUS | 0 refills | Status: DC

## 2023-12-10 NOTE — Patient Instructions (Addendum)
 Try to track steps with a  smart watch-- aim for > 8,000 steps/ day Aim for GYM workouts 2-3 x a week  Increase Zepbound  to 7.5 mg weekly  Work on increasing water intake You can have sugar free drink options, too!

## 2023-12-10 NOTE — Progress Notes (Signed)
 Office: [phone]  /  Fax: [phone]  WEIGHT SUMMARY AND BIOMETRICS  Starting Date: 02/25/23  Starting Weight: 186lb   Weight Lost Since Last Visit: 0lb   Vitals Temp: 98.5 F (36.9 C) BP: 93/67 Pulse Rate: 85 SpO2: 100 %   Body Composition  Body Fat %: 40.8 % Fat Mass (lbs): 77.8 lbs Muscle Mass (lbs): 107.2 lbs Total Body Water (lbs): 75 lbs Visceral Fat Rating : 9    HPI  Chief Complaint: OBESITY  Carlene is here to discuss her progress with her obesity treatment plan. She is on the the Category 3 Plan and states she is following her eating plan approximately 80 % of the time. She states she is exercising 30 minutes 2 times per week.  Interval History:  Since last office visit she is down 0 lb She remains on Zepbound  5 mg weekly (cash pay) x 2 mos She has less satiety but avoids overeating and poor food choices She will be traveling to [facility] this Summer She struggles to get in enough water  She has been less consistent with exercise Her mom has been cooking dinners most nights She denies large portion sizes or over snacking Denies sugar cravings  Pharmacotherapy: Zepbound  5 mg weekly cash pay  PHYSICAL EXAM:  Blood pressure 93/67, pulse 85, temperature 98.5 F (36.9 C), height 5' 2 (1.575 m), weight 190 lb (86.2 kg), SpO2 100%. Body mass index is 34.75 kg/m.  General: She is overweight, cooperative, alert, well developed, and in no acute distress. PSYCH: Has normal mood, affect and thought process.   Lungs: Normal breathing effort, no conversational dyspnea.   ASSESSMENT AND PLAN  TREATMENT PLAN FOR OBESITY:  Recommended Dietary Goals  Jashiya is currently in the action stage of change. As such, her goal is to continue weight management plan. She has agreed to keeping a food journal and adhering to recommended goals of 1500 calories and 90 g of  protein.  Behavioral Intervention  We discussed the following Behavioral Modification Strategies  today: increasing lean protein intake to established goals, increasing fiber rich foods, increasing water intake , work on meal planning and preparation, work on tracking and journaling calories using tracking application, reading food labels , keeping healthy foods at home, avoiding temptations and identifying enticing environmental cues, planning for success, and continue to work on maintaining a reduced calorie state, getting the recommended amount of protein, incorporating whole foods, making healthy choices, staying well hydrated and practicing mindfulness when eating..  Additional resources provided today: NA  Recommended Physical Activity Goals  Amora has been advised to work up to 150 minutes of moderate intensity aerobic activity a week and strengthening exercises 2-3 times per week for cardiovascular health, weight loss maintenance and preservation of muscle mass.   She has agreed to [employer] strengthening exercises with a goal of 2-3 sessions a week  and Increase the intensity, frequency or duration of aerobic exercises    Pharmacotherapy changes for the treatment of obesity: increase Zepbound  to 7.5 mg weekly  ASSOCIATED CONDITIONS ADDRESSED TODAY  Polyphagia Worsening as she has gotten used to Zepbound  5 mg x 8 injections She is not tracking her calories and struggles to get in enough water Advised tracking calorie intake, increasing protein, water and fiber intake for improved satiety and will increase Zepbound  to 7.5 mg weekly  Class 1 obesity due to excess calories with body mass index (BMI) of 34.0 to 34.9 in adult, unspecified whether serious comorbidity present -  Tirzepatide -Weight Management; Inject 7.5 mg into the skin once a week.  Dispense: 2 mL; Refill: 0      She was informed of the importance of frequent follow up visits to maximize her success with intensive lifestyle modifications for her multiple health conditions.   ATTESTASTION STATEMENTS:  Reviewed by  clinician on day of visit: allergies, medications, problem list, medical history, surgical history, family history, social history, and previous encounter notes pertinent to obesity diagnosis.   I have personally spent 30 minutes total time today in preparation, patient care, nutritional counseling and education,  and documentation for this visit, including the following: review of most recent clinical lab tests, prescribing medications/ refilling medications, reviewing medical assistant documentation, review and interpretation of bioimpedence results.     Micky Albee, D.O. DABFM, DABOM [facility] [address] [place], [place] [address] [phone]

## 2023-12-21 ENCOUNTER — Other Ambulatory Visit: Payer: Self-pay | Admitting: Family Medicine

## 2023-12-21 DIAGNOSIS — E6609 Other obesity due to excess calories: Secondary | ICD-10-CM

## 2024-01-05 ENCOUNTER — Ambulatory Visit: Admitting: Family Medicine

## 2024-01-05 ENCOUNTER — Encounter: Payer: Self-pay | Admitting: Family Medicine

## 2024-01-05 VITALS — BP 104/72 | HR 81 | Temp 98.6°F | Ht 62.0 in | Wt 185.0 lb

## 2024-01-05 DIAGNOSIS — Z6833 Body mass index (BMI) 33.0-33.9, adult: Secondary | ICD-10-CM | POA: Diagnosis not present

## 2024-01-05 DIAGNOSIS — E66811 Obesity, class 1: Secondary | ICD-10-CM

## 2024-01-05 DIAGNOSIS — Z9189 Other specified personal risk factors, not elsewhere classified: Secondary | ICD-10-CM

## 2024-01-05 DIAGNOSIS — E6609 Other obesity due to excess calories: Secondary | ICD-10-CM

## 2024-01-05 DIAGNOSIS — Z7289 Other problems related to lifestyle: Secondary | ICD-10-CM | POA: Diagnosis not present

## 2024-01-05 DIAGNOSIS — R632 Polyphagia: Secondary | ICD-10-CM | POA: Diagnosis not present

## 2024-01-05 MED ORDER — TIRZEPATIDE-WEIGHT MANAGEMENT 7.5 MG/0.5ML ~~LOC~~ SOLN: 7.5000 mg | SUBCUTANEOUS | 1 refills | Status: DC

## 2024-01-05 NOTE — Progress Notes (Signed)
 Office: [phone]  /  Fax: [phone]  WEIGHT SUMMARY AND BIOMETRICS  Starting Date: 02/25/23  Starting Weight: 186lb   Weight Lost Since Last Visit: 5lb   Vitals Temp: 98.6 F (37 C) BP: 104/72 Pulse Rate: 81 SpO2: 100 %   Body Composition  Body Fat %: 40.1 % Fat Mass (lbs): 74.6 lbs Muscle Mass (lbs): 105.6 lbs Total Body Water (lbs): 73.2 lbs Visceral Fat Rating : 8   HPI  Chief Complaint: OBESITY  Allison Fischer is here to discuss her progress with her obesity treatment plan. She is on the the Category 3 Plan and states she is following her eating plan approximately 50 % of the time. She states she is exercising 60 minutes 3 times per week.  Interval History:  Since last office visit she is down 5 lb She is down 1.6 pounds with muscle mass and down 3.2 pounds of body fat since last visit She has lost 9 lb since starting Zepbound  in early April She has struggled to add in exercise She has been to the beach since last visit and [place] the [place] next week She has been doing some walking She has been enjoying the added satiety from Zepbound  7.5 mg once weekly injection She denies purposeful meal skipping, GI upset She has been more mindful about getting lean protein but has been lacking fruits and vegetables  Pharmacotherapy: Zepbound  7.5 mg once weekly injection  PHYSICAL EXAM:  Blood pressure 104/72, pulse 81, temperature 98.6 F (37 C), height 5' 2 (1.575 m), weight 185 lb (83.9 kg), SpO2 100%. Body mass index is 33.84 kg/m.  General: She is overweight, cooperative, alert, well developed, and in no acute distress. PSYCH: Has normal mood, affect and thought process.   Lungs: Normal breathing effort, no conversational dyspnea.   ASSESSMENT AND PLAN  TREATMENT PLAN FOR OBESITY:  Recommended Dietary Goals  Allison Fischer is currently in the action stage of change. As such, her goal is to continue weight management plan. She has agreed to keeping a food  journal and adhering to recommended goals of 1500 calories and 90 g of protein.  Behavioral Intervention  We discussed the following Behavioral Modification Strategies today: increasing lean protein intake to established goals, increasing vegetables, increasing fiber rich foods, increasing water intake , work on meal planning and preparation, work on tracking and journaling calories using tracking application, keeping healthy foods at home, decreasing eating out or consumption of processed foods, and making healthy choices when eating convenient foods, avoiding temptations and identifying enticing environmental cues, and continue to work on maintaining a reduced calorie state, getting the recommended amount of protein, incorporating whole foods, making healthy choices, staying well hydrated and practicing mindfulness when eating..  Additional resources provided today: NA  Recommended Physical Activity Goals  Cayle has been advised to work up to 150 minutes of moderate intensity aerobic activity a week and strengthening exercises 2-3 times per week for cardiovascular health, weight loss maintenance and preservation of muscle mass.   She has agreed to Start aerobic activity with a goal of 150 minutes a week at moderate intensity.  - Remains in the contemplative phase of behavior change to adding an exercise  Pharmacotherapy changes for the treatment of obesity: None  ASSOCIATED CONDITIONS ADDRESSED TODAY  Polyphagia Improved on Zepbound  7.5 mg once weekly injection with adequate appetite control and without meal skipping.  Portion sizes have reduced as well as excess snacking.  She has reduced her intake of starches and sugar.  Continue to work on hitting a daily target goal of 90 g of protein daily, increasing intake of fruits and vegetables and hydrating well with water.  Class 1 obesity due to excess calories with body mass index (BMI) of 33.0 to 33.9 in adult, unspecified whether serious  comorbidity present -     Tirzepatide -Weight Management; Inject 7.5 mg into the skin once a week.  Dispense: 2 mL; Refill: 1  Sedentary lifestyle Unchanged.  She remains in the contemplative phase of behavior change for adding in exercise.  We discussed adding in a 15-minute home workout 3 days a week on workdays and doing something fun for physical activity on the weekends with her kids.  Recommended tracking daily steps with smart watch.     She was informed of the importance of frequent follow up visits to maximize her success with intensive lifestyle modifications for her multiple health conditions.   ATTESTASTION STATEMENTS:  Reviewed by clinician on day of visit: allergies, medications, problem list, medical history, surgical history, family history, social history, and previous encounter notes pertinent to obesity diagnosis.   I have personally spent 30 minutes total time today in preparation, patient care, nutritional counseling and education,  and documentation for this visit, including the following: review of most recent clinical lab tests, prescribing medications/ refilling medications, reviewing medical assistant documentation, review and interpretation of bioimpedence results.     [name], D.O. DABFM, DABOM [facility] [address] [place], [place] [address] [phone]

## 2024-02-16 ENCOUNTER — Other Ambulatory Visit: Payer: Self-pay | Admitting: Family Medicine

## 2024-02-16 ENCOUNTER — Encounter: Payer: Self-pay | Admitting: Family Medicine

## 2024-02-16 ENCOUNTER — Ambulatory Visit: Admitting: Family Medicine

## 2024-02-16 VITALS — BP 94/68 | HR 92 | Temp 98.6°F | Ht 62.0 in | Wt 183.0 lb

## 2024-02-16 DIAGNOSIS — E66811 Obesity, class 1: Secondary | ICD-10-CM

## 2024-02-16 DIAGNOSIS — K5901 Slow transit constipation: Secondary | ICD-10-CM | POA: Diagnosis not present

## 2024-02-16 DIAGNOSIS — Z9189 Other specified personal risk factors, not elsewhere classified: Secondary | ICD-10-CM

## 2024-02-16 DIAGNOSIS — E6609 Other obesity due to excess calories: Secondary | ICD-10-CM

## 2024-02-16 DIAGNOSIS — Z6833 Body mass index (BMI) 33.0-33.9, adult: Secondary | ICD-10-CM

## 2024-02-16 DIAGNOSIS — Z7289 Other problems related to lifestyle: Secondary | ICD-10-CM | POA: Diagnosis not present

## 2024-02-16 MED ORDER — TIRZEPATIDE-WEIGHT MANAGEMENT 7.5 MG/0.5ML ~~LOC~~ SOLN: 7.5000 mg | SUBCUTANEOUS | 1 refills | Status: DC

## 2024-02-16 NOTE — Patient Instructions (Addendum)
 Move Zepbound  7.5 mg to every 10 days  Let's re- do you meal plan: Breakfast: 2 eggs + 1/2 cup cottage cheese OR A FairLife Protein shake (or Premier Protein shake)   Add in Metamucil + water or flavored water in the mornings to help with chronic constipation  Lunch: bring to work as much as possible Chick fil A grilled nuggets + fruit cup or kale salad  Water/ Propel  Dinner: use a kid's sized plate and balance lean meat/ veggies/ starch Listen to your fullness cues  Sip on water with dinner/ after dinner At night, sip on hot peppermint tea to help with digestion  Cut out BREAD

## 2024-02-16 NOTE — Progress Notes (Signed)
 Office: [phone]  /  Fax: [phone]  WEIGHT SUMMARY AND BIOMETRICS  Starting Date: 02/25/23  Starting Weight: 186lb   Weight Lost Since Last Visit: 2lb   Vitals Temp: 98.6 F (37 C) BP: 94/68 Pulse Rate: 92 SpO2: 100 %   Body Composition  Body Fat %: 39.1 % Fat Mass (lbs): 71.8 lbs Muscle Mass (lbs): 106.4 lbs Total Body Water (lbs): 72.6 lbs Visceral Fat Rating : 8    HPI  Chief Complaint: OBESITY  Allison Fischer is here to discuss her progress with her obesity treatment plan. She is on the the Category 3 Plan and states she is following her eating plan approximately 50 % of the time. She states she is exercising 0 minutes 0 times per week.  Interval History:  Since last office visit she is down 2 lb This gives her a net weight loss of 11 lb in 4 mos since starting Zepbound  from [employer] She has adequate satiety from Zepbound  7.5 mg weekly She is eating smaller portion sizes and snacks seldom She traveled to [place]  and felt poor with travel/ altitude She is having problems with chronic constipation and sour burps Denies heartburn She doesn't like many fruits but does eat veggies She plans to add in some morning exercise since her kids just went back to school  Pharmacotherapy: Zepbound  7.5 mg weekly  PHYSICAL EXAM:  Blood pressure 94/68, pulse 92, temperature 98.6 F (37 C), height 5' 2 (1.575 m), weight 183 lb (83 kg), SpO2 100%. Body mass index is 33.47 kg/m.  General: She is overweight, cooperative, alert, well developed, and in no acute distress. PSYCH: Has normal mood, affect and thought process.   Lungs: Normal breathing effort, no conversational dyspnea.   ASSESSMENT AND PLAN  TREATMENT PLAN FOR OBESITY:  Recommended Dietary Goals  Allison Fischer is currently in the action stage of change. As such, her goal is to continue weight management plan. She has agreed to following a lower carbohydrate, vegetable and lean protein rich diet  plan. Reviewed dietary change goals on AVS with patient:  Breakfast: 2 eggs + 1/2 cup cottage cheese OR A FairLife Protein shake (or Premier Protein shake)   Add in Metamucil + water or flavored water in the mornings to help with chronic constipation  Lunch: bring to work as much as possible Chick fil A grilled nuggets + fruit cup or kale salad  Water/ Propel  Dinner: use a kid's sized plate and balance lean meat/ veggies/ starch Listen to your fullness cues  Sip on water with dinner/ after dinner At night, sip on hot peppermint tea to help with digestion  Cut out BREAD  Behavioral Intervention  We discussed the following Behavioral Modification Strategies today: increasing lean protein intake to established goals, increasing fiber rich foods, increasing water intake , work on meal planning and preparation, keeping healthy foods at home, identifying sources and decreasing liquid calories, work on managing stress, creating time for self-care and relaxation, planning for success, and increase protein intake, fibrous foods (25 grams per day for women, 30 grams for men) and water to improve satiety and decrease hunger signals. .  Additional resources provided today: NA  Recommended Physical Activity Goals  Allison Fischer has been advised to work up to 150 minutes of moderate intensity aerobic activity a week and strengthening exercises 2-3 times per week for cardiovascular health, weight loss maintenance and preservation of muscle mass.   She has agreed to Patient will begin to exercise add in 30 min of  AM workouts from home 3 days/ wk  Pharmacotherapy changes for the treatment of obesity: move Zepbound  7.5 mg injection to q 10 days  ASSOCIATED CONDITIONS ADDRESSED TODAY  Sedentary lifestyle Unchanged Time constraints with work and home, a mostly sedentary job have been contributing factors to her lack of exercise She plans to add in AM workouts from home 3 days/ wk We have discussed  outdoor play or family walks in the evening  Class 1 obesity due to excess calories with body mass index (BMI) of 33.0 to 33.9 in adult, unspecified whether serious comorbidity present -     Tirzepatide -Weight Management; Inject 7.5 mg into the skin once a week.  Dispense: 2 mL; Refill: 1 She has adequate satiety from Zepbound  7.5 mg with some sour burps and constipation Re- vamped from standard RX food plan to that on AVS and reviewed with patient Move injections to q 10 days Increase water and fiber intake Plan to add in more consistent exercise  Constipation by delayed colonic transit Chronic, worsened by Zepbound  Lacks adequate water and fiber intake Reviewed plan to add in Metamucil daily  Add in a fruit serving with breakfast in place of BREAD Add in a veggie serving with LUNCH    She was informed of the importance of frequent follow up visits to maximize her success with intensive lifestyle modifications for her multiple health conditions.   ATTESTASTION STATEMENTS:  Reviewed by clinician on day of visit: allergies, medications, problem list, medical history, surgical history, family history, social history, and previous encounter notes pertinent to obesity diagnosis.   I have personally spent 30 minutes total time today in preparation, patient care, nutritional counseling and education,  and documentation for this visit, including the following: review of most recent clinical lab tests, prescribing medications/ refilling medications, reviewing medical assistant documentation, review and interpretation of bioimpedence results.     Allison Fischer, D.O. DABFM, DABOM [facility] [address] [place], [place] [address] [phone]

## 2024-03-14 ENCOUNTER — Ambulatory Visit: Admission: EM | Admit: 2024-03-14 | Discharge: 2024-03-14 | Disposition: A | Discharge status: Home / Self Care

## 2024-03-14 DIAGNOSIS — J029 Acute pharyngitis, unspecified: Secondary | ICD-10-CM

## 2024-03-14 DIAGNOSIS — J020 Streptococcal pharyngitis: Secondary | ICD-10-CM | POA: Diagnosis not present

## 2024-03-14 LAB — POCT RAPID STREP A (OFFICE): Rapid Strep A Screen: POSITIVE — AB

## 2024-03-14 MED ORDER — AZITHROMYCIN 500 MG PO TABS: 500.0000 mg | ORAL_TABLET | Freq: Every day | ORAL | 0 refills | Status: AC

## 2024-03-14 MED ORDER — IBUPROFEN 800 MG PO TABS: 800.0000 mg | ORAL_TABLET | Freq: Three times a day (TID) | ORAL | 0 refills | Status: AC | PRN

## 2024-03-14 NOTE — ED Provider Notes (Signed)
 [facility]    CSN: [id number] Arrival date & time: 03/14/24  0850      History   Chief Complaint Chief Complaint  Patient presents with   Sore Throat    HPI Allison Fischer is a 38 y.o. female.   Patient presents with a 2-day history of sore throat, neck pain, headaches, and fever with a reported maximum temperature of 100F. She denies chills, nasal congestion, rhinorrhea, sneezing, cough, nausea, vomiting, or diarrhea. She has been taking Motrin  for symptom relief. No known sick contacts reported.  The following sections of the patient's history were reviewed and updated as appropriate: allergies, current medications, past family history, past medical history, past social history, past surgical history, and problem list.     Past Medical History:  Diagnosis Date   Allergy    Back pain    Constipation    Edema    Medical history non-contributory    SOB (shortness of breath)     Patient Active Problem List   Diagnosis Date Noted   Medication side effect 07/01/2023   Class 1 obesity due to excess calories without serious comorbidity with body mass index (BMI) of 34.0 to 34.9 in adult 07/01/2023   Vitamin D  insufficiency 03/31/2023   Elevated ferritin 03/11/2023   Tachycardia, unspecified 03/11/2023   SOBOE (shortness of breath on exertion) 02/25/2023   Depression 02/25/2023   Polyphagia 02/25/2023   BMI 34.0-34.9,adult 02/25/2023   Depression screen 02/25/2023   Generalized obesity with starting BMI 34.0 02/25/2023   Eating disorder 02/17/2023   Contusion of right index finger without damage to nail 01/05/2022   Body mass index 29.0-29.9, adult 10/06/2021   Superficial incisional infection of surgical site 09/12/2021   S/P cosmetic plastic surgery 09/12/2021   Weight loss 05/21/2021   Need for influenza vaccination 04/04/2021   Other fatigue 03/17/2021   Acute midline low back pain without sciatica 02/09/2021   Encounter for general adult medical  examination with abnormal findings 11/19/2020   Hyperlipidemia LDL goal <100 11/19/2020   Encounter to establish care 10/01/2020   Abnormal weight gain 10/01/2020   BMI 31.0-31.9,adult 10/01/2020   Pregnancy 11/02/2018   NSVD (normal spontaneous vaginal delivery) 10/06/2015   Indication for care in labor or delivery 10/06/2015    Past Surgical History:  Procedure Laterality Date   APPENDECTOMY N/A    Phreesia 09/24/2020   COSMETIC SURGERY     EYE SURGERY Bilateral 12/2007   cornea sx to correct vision   LAPAROSCOPIC APPENDECTOMY N/A 04/21/2017   Procedure: APPENDECTOMY LAPAROSCOPIC;  Surgeon: Stevie Herlene Righter, MD;  Location: [facility] OR;  Service: General;  Laterality: N/A;   LAPAROSCOPIC TUBAL LIGATION Bilateral 03/23/2020   Procedure: LAPAROSCOPIC TUBAL LIGATION with filshie clips;  Surgeon: Dannielle Bouchard, DO;  Location: [facility];  Service: Gynecology;  Laterality: Bilateral;   TUBAL LIGATION N/A    Phreesia 09/24/2020    OB History     Gravida  2   Para  2   Term  2   Preterm      AB      Living  2      SAB      IAB      Ectopic      Multiple  0   Live Births  2            Home Medications    Prior to Admission medications   Medication Sig Start Date End Date Taking? Authorizing Provider  ALPRAZolam (  XANAX PO) Take by mouth.   Yes [provider]  azithromycin  (ZITHROMAX ) 500 MG tablet Take 1 tablet (500 mg total) by mouth daily for 5 days. 03/14/24 03/19/24 Yes Iola Lukes, FNP  ibuprofen  (ADVIL ) 800 MG tablet Take 1 tablet (800 mg total) by mouth every 8 (eight) hours as needed (pain). Do not take any additional over-the-counter NSAIDs (such as Advil , Aleve , or other ibuprofen /naproxen  products) while using this medication. 03/14/24  Yes Saydi Kobel, FNP  fluticasone  (FLONASE ) 50 MCG/ACT nasal spray SPRAY 2 SPRAYS INTO EACH NOSTRIL EVERY DAY 05/07/23   Chandra Toribio POUR, MD  tirzepatide  7.5 MG/0.5ML injection vial  Inject 7.5 mg into the skin once a week. 02/16/24   Bowen, Darice BRAVO, DO    Family History Family History  Problem Relation Age of Onset   Thyroid disease Mother    Hyperlipidemia Mother    Hypertension Mother    Diabetes Mother    High Cholesterol Mother    Heart disease Mother    Sleep apnea Mother    Obesity Mother    High blood pressure Father    Hypertension Maternal Grandmother    Cancer Maternal Grandmother    Heart attack Maternal Grandfather    High blood pressure Maternal Grandfather    High blood pressure Paternal Grandmother    Heart attack Paternal Grandfather    High blood pressure Paternal Grandfather     Social History Social History   Tobacco Use   Smoking status: Never   Smokeless tobacco: Never  Vaping Use   Vaping status: Never Used  Substance Use Topics   Alcohol use: Yes    Alcohol/week: 0.0 standard drinks of alcohol    Comment: occ weekends   Drug use: No     Allergies   Penicillins and Tape   Review of Systems Review of Systems  Constitutional:  Positive for fever (low-grade 100). Negative for chills.  HENT:  Positive for sore throat. Negative for congestion, rhinorrhea and sneezing.   Respiratory:  Negative for cough.   Gastrointestinal:  Negative for diarrhea, nausea and vomiting.  Musculoskeletal:  Positive for neck pain.  Neurological:  Positive for headaches.  All other systems reviewed and are negative.    Physical Exam Triage Vital Signs ED Triage Vitals  Encounter Vitals Group     BP 03/14/24 1048 91/60     Girls Systolic BP Percentile --      Girls Diastolic BP Percentile --      Boys Systolic BP Percentile --      Boys Diastolic BP Percentile --      Pulse Rate 03/14/24 1048 81     Resp 03/14/24 1048 18     Temp 03/14/24 1048 98.3 F (36.8 C)     Temp Source 03/14/24 1048 Oral     SpO2 03/14/24 1048 97 %     Weight 03/14/24 1045 185 lb (83.9 kg)     Height 03/14/24 1045 5' 2 (1.575 m)     Head Circumference --       Peak Flow --      Pain Score 03/14/24 1042 5     Pain Loc --      Pain Education --      Exclude from Growth Chart --    No data found.  Updated Vital Signs BP 91/60 (BP Location: Left Arm)   Pulse 81   Temp 98.3 F (36.8 C) (Oral)   Resp 18   Ht 5' 2 (1.575 m)  Wt 185 lb (83.9 kg)   LMP 02/26/2024 (Exact Date)   SpO2 97%   BMI 33.84 kg/m   Visual Acuity Right Eye Distance:   Left Eye Distance:   Bilateral Distance:    Right Eye Near:   Left Eye Near:    Bilateral Near:     Physical Exam Vitals reviewed.  Constitutional:      General: She is not in acute distress.    Appearance: Normal appearance. She is well-developed and normal weight. She is not ill-appearing, toxic-appearing or diaphoretic.  HENT:     Head: Normocephalic.     Nose: Nose normal.     Mouth/Throat:     Lips: Pink.     Mouth: Mucous membranes are moist.     Pharynx: Uvula midline. Posterior oropharyngeal erythema present. No pharyngeal swelling, oropharyngeal exudate or uvula swelling.     Tonsils: No tonsillar exudate or tonsillar abscesses.  Cardiovascular:     Rate and Rhythm: Normal rate.  Pulmonary:     Effort: Pulmonary effort is normal. No tachypnea.     Breath sounds: Normal breath sounds and air entry. No decreased air movement. No decreased breath sounds.  Musculoskeletal:        General: Normal range of motion.     Cervical back: Full passive range of motion without pain, normal range of motion and neck supple.  Lymphadenopathy:     Cervical: No cervical adenopathy.  Skin:    General: Skin is warm and dry.  Neurological:     General: No focal deficit present.     Mental Status: She is alert and oriented to person, place, and time.  Psychiatric:        Mood and Affect: Mood normal.        Behavior: Behavior normal. Behavior is cooperative.      UC Treatments / Results  Labs (all labs ordered are listed, but only abnormal results are displayed) Labs Reviewed  POCT  RAPID STREP A (OFFICE) - Abnormal; Notable for the following components:      Result Value   Rapid Strep A Screen Positive (*)    All other components within normal limits    EKG   Radiology No results found.  Procedures Procedures (including critical care time)  Medications Ordered in UC Medications - No data to display  Initial Impression / Assessment and Plan / UC Course  I have reviewed the triage vital signs and the nursing notes.  Pertinent labs & imaging results that were available during my care of the patient were reviewed by me and considered in my medical decision making (see chart for details).     Patient presents with sore throat. Rapid strep positive. Antibiotic therapy required.  The importance of taking the medication exactly as directed and completing the entire course, even if symptoms improve, was emphasized to reduce the risk of recurrence or complications. Supportive care measures were discussed, including the use of warm liquids, cold drinks or ice pops, throat lozenges, salt water gargles, and use of a cool-mist humidifier for symptom relief. Analgesics such as acetaminophen  or ibuprofen  may be taken as needed for pain or fever. Hydration was stressed as an important part of recovery. Patient was advised to replace their toothbrush once antibiotic therapy is complete and symptoms have resolved to reduce risk of re-infection. Follow-up with primary care was recommended if symptoms worsen or fail to improve within several days. Emergency precautions were reviewed for any new or severe symptoms.  Today's  evaluation has revealed no signs of a dangerous process. Discussed diagnosis with patient and/or guardian. Patient and/or guardian aware of their diagnosis, possible red flag symptoms to watch out for and need for close follow up. Patient and/or guardian understands verbal and written discharge instructions. Patient and/or guardian comfortable with plan and  disposition.  Patient and/or guardian has a clear mental status at this time, good insight into illness (after discussion and teaching) and has clear judgment to make decisions regarding their care  Documentation was completed with the aid of voice recognition software. Transcription may contain typographical errors.  Final Clinical Impressions(s) / UC Diagnoses   Final diagnoses:  Strep pharyngitis  Acute sore throat     Discharge Instructions      Usted tiene faringitis estreptoccica, tambin conocida como "strep throat". El estreptococo es una infeccin [place] y requiere antibiticos para tratarse. Tome los antibiticos segn lo indicado. Asegrese de terminar todo el tratamiento con antibiticos aunque empiece a sentirse mejor. Qudese en casa sin ir a la escuela o al veda saas que haya tomado antibiticos por al menos 24 horas.  Para aliviar las [employer] garganta, puede beber lquidos tibios como caldo, t o agua caliente. Las bebidas fras o congeladas y los bocadillos como las paletas de hielo tambin pueden Secretary/administrator garganta. Hacer grgaras con agua tibia con sal de tres a cuatro veces al da puede dar [place], y chupar caramelos duros o pastillas para la garganta tambin puede ayudar. Usar un humidificador de vapor fro por la noche puede mantener el aire hmedo y facilitar la respiracin.  Puede tomar Tylenol  (acetaminofn) o ibuprofeno si tiene dolor o fiebre. Beba abundante lquido hasta que su orina sea de color amarillo claro para mantenerse bien hidratado. Una vez que haya terminado su medicamento y sus sntomas hayan mejorado, recuerde reemplazar su cepillo de dientes para ayudar a prevenir una reinfeccin.  Si sus sntomas empeoran o no mejoran en [address], consulte a su proveedor de Psychologist, prison and probation services.  You have strep pharyngitis which is also known as strep throat. Strep is a bacterial infection and requires antibiotics to treat. Take antibiotics as prescribed.  Make sure you finish all the antibiotics even if you start to feel better. Stay home from school or work until you have been taking antibiotics for 24 hours.  To help with throat discomfort, you can sip warm liquids like broth, tea, or warm water. Cold or frozen drinks and snacks, like ice pops, can also soothe your throat. Gargling with warm salt water three to four times a day may provide relief, and sucking on hard candy or throat lozenges can help as well. Using a cool-mist humidifier at night can keep the air moist and make breathing easier. Tylenol  or ibuprofen  can be taken if you have any pain or fever. Be sure to drink plenty of fluids to stay well hydrated. Once you have finished your medication and your symptoms have improved, remember to replace your toothbrush to help prevent re-infection. If your symptoms get worse or do not improve within a few days, follow up with your healthcare provider.     ED Prescriptions     Medication Sig Dispense Auth. Provider   azithromycin  (ZITHROMAX ) 500 MG tablet Take 1 tablet (500 mg total) by mouth daily for 5 days. 5 tablet Iola Lukes, FNP   ibuprofen  (ADVIL ) 800 MG tablet Take 1 tablet (800 mg total) by mouth every 8 (eight) hours as needed (pain). Do not take any additional over-the-counter  NSAIDs (such as Advil , Aleve , or other ibuprofen /naproxen  products) while using this medication. 21 tablet Iola Lukes, FNP      PDMP not reviewed this encounter.   Iola Lukes, [place] 03/14/24 1214

## 2024-03-14 NOTE — Discharge Instructions (Addendum)
 Usted tiene faringitis estreptoccica, tambin conocida como "strep throat". El estreptococo es una infeccin [place] y requiere antibiticos para tratarse. Tome los antibiticos segn lo indicado. Asegrese de terminar todo el tratamiento con antibiticos aunque empiece a sentirse mejor. Qudese en casa sin ir a la escuela o al veda saas que haya tomado antibiticos por al menos 24 horas.  Para aliviar las [employer] garganta, puede beber lquidos tibios como caldo, t o agua caliente. Las bebidas fras o congeladas y los bocadillos como las paletas de hielo tambin pueden Secretary/administrator garganta. Hacer grgaras con agua tibia con sal de tres a cuatro veces al da puede dar [place], y chupar caramelos duros o pastillas para la garganta tambin puede ayudar. Usar un humidificador de vapor fro por la noche puede mantener el aire hmedo y facilitar la respiracin.  Puede tomar Tylenol  (acetaminofn) o ibuprofeno si tiene dolor o fiebre. Beba abundante lquido hasta que su orina sea de color amarillo claro para mantenerse bien hidratado. Una vez que haya terminado su medicamento y sus sntomas hayan mejorado, recuerde reemplazar su cepillo de dientes para ayudar a prevenir una reinfeccin.  Si sus sntomas empeoran o no mejoran en [address], consulte a su proveedor de Psychologist, prison and probation services.  You have strep pharyngitis which is also known as strep throat. Strep is a bacterial infection and requires antibiotics to treat. Take antibiotics as prescribed. Make sure you finish all the antibiotics even if you start to feel better. Stay home from school or work until you have been taking antibiotics for 24 hours.  To help with throat discomfort, you can sip warm liquids like broth, tea, or warm water. Cold or frozen drinks and snacks, like ice pops, can also soothe your throat. Gargling with warm salt water three to four times a day may provide relief, and sucking on hard candy or throat lozenges can help as well.  Using a cool-mist humidifier at night can keep the air moist and make breathing easier. Tylenol  or ibuprofen  can be taken if you have any pain or fever. Be sure to drink plenty of fluids to stay well hydrated. Once you have finished your medication and your symptoms have improved, remember to replace your toothbrush to help prevent re-infection. If your symptoms get worse or do not improve within a few days, follow up with your healthcare provider.

## 2024-03-14 NOTE — ED Triage Notes (Addendum)
 Due to language barrier, an interpreter was present during the history-taking and subsequent discussion (and for part of the physical exam) with this patient. Allison Fischer. Number: [id number]. Patient declined (unless needed later). See Medlist for last dose: Tylenol  and other meds.  Patient reports sore throat starting Saturday with Fever (over 100). When looking in mirror throat looked really bad. No cough. No runny nose. No rash.

## 2024-03-21 ENCOUNTER — Ambulatory Visit: Admitting: Family Medicine

## 2024-03-21 ENCOUNTER — Encounter: Payer: Self-pay | Admitting: Nurse Practitioner

## 2024-03-21 ENCOUNTER — Ambulatory Visit: Admitting: Nurse Practitioner

## 2024-03-21 VITALS — BP 86/56 | HR 80 | Temp 98.4°F | Ht 62.0 in | Wt 179.0 lb

## 2024-03-21 DIAGNOSIS — E559 Vitamin D deficiency, unspecified: Secondary | ICD-10-CM | POA: Diagnosis not present

## 2024-03-21 DIAGNOSIS — E66811 Obesity, class 1: Secondary | ICD-10-CM

## 2024-03-21 DIAGNOSIS — R7989 Other specified abnormal findings of blood chemistry: Secondary | ICD-10-CM

## 2024-03-21 DIAGNOSIS — Z131 Encounter for screening for diabetes mellitus: Secondary | ICD-10-CM

## 2024-03-21 DIAGNOSIS — Z79899 Other long term (current) drug therapy: Secondary | ICD-10-CM

## 2024-03-21 DIAGNOSIS — Z13220 Encounter for screening for lipoid disorders: Secondary | ICD-10-CM | POA: Diagnosis not present

## 2024-03-21 DIAGNOSIS — R5383 Other fatigue: Secondary | ICD-10-CM

## 2024-03-21 DIAGNOSIS — Z6832 Body mass index (BMI) 32.0-32.9, adult: Secondary | ICD-10-CM

## 2024-03-21 DIAGNOSIS — Z862 Personal history of diseases of the blood and blood-forming organs and certain disorders involving the immune mechanism: Secondary | ICD-10-CM

## 2024-03-21 MED ORDER — TIRZEPATIDE-WEIGHT MANAGEMENT 7.5 MG/0.5ML ~~LOC~~ SOLN: 7.5000 mg | SUBCUTANEOUS | 1 refills | Status: DC

## 2024-03-21 NOTE — Progress Notes (Signed)
 Office: [phone]  /  Fax: [phone]  WEIGHT SUMMARY AND BIOMETRICS  Weight Lost Since Last Visit: 4lb  Weight Gained Since Last Visit: 0lb   Vitals Temp: 98.4 F (36.9 C) BP: (!) 86/56 Pulse Rate: 80 SpO2: 99 %   Anthropometric Measurements Height: 5' 2 (1.575 m) Weight: 179 lb (81.2 kg) BMI (Calculated): 32.73 Weight at Last Visit: 183lb Weight Lost Since Last Visit: 4lb Weight Gained Since Last Visit: 0lb Starting Weight: 186lb Total Weight Loss (lbs): 7 lb (3.175 kg)   Body Composition  Body Fat %: 39 % Fat Mass (lbs): 69.8 lbs Muscle Mass (lbs): 103.6 lbs Total Body Water (lbs): 73.2 lbs Visceral Fat Rating : 8   Other Clinical Data Fasting: Yes Labs: Yes Today's Visit #: 14 Starting Date: 02/25/23     HPI  Chief Complaint: OBESITY  Kendyl is here to discuss her progress with her obesity treatment plan. She is on the the Category 3 Plan and states she is following her eating plan approximately 50 % of the time. She states she is exercising 60 minutes 3 days per week.   Interval History:  Since last office visit she has lost 4 pounds.  She recent was treated for strep throat.  She is drinking water (not enough) and coffee daily. She will sometimes drink a hot tea before bedtime.  She notes it hard to drink enough water due to her work schedule.  She drives all day for work.  She is going to the gym 2-3 days per week. She is walk/run on the treadmill 25-30 mins and then doing resistance 20-25 minutes.   BF:  2-3 eggs with multi grain bread with coffee and cream (hasn't added a fruit to BF) Snack:  none Lunch:  grilled chicken nuggets (12 piece) from chick fil a or a protein shake (hasn't added a veggie to lunch). Snack:  none Dinner:  rice or potatoes, vegetables with a protein  She sometimes will eat a banana but generally doesn't eat fruits.     Pharmacotherapy for weight loss: She is currently taking Zepbound  7.5mg  (self pay with [employer])  every 10 days medical weight loss (changed to every 10 days since last visit).  Notes some sour burps cut has improved since her last visit.    Previous pharmacotherapy for medical weight loss:  Phentermine   Bariatric surgery:  Patient has not had bariatric surgery.    Constipation Improved.  She has increased her fiber intake and is taking Metamucil every other day.  She reports struggling with constipation for years.  She doesn't thing her constipation has gotten worse since starting Zepbound .   Elevated ferritin She is not taking a MVI or iron.   Vit D insufficiency  She is not currently taking Vit D-took last 2 months ago.    Lab Results  Component Value Date   VD25OH 34.4 02/25/2023    History of anemia Not currently taking a MVI, Vit B12 or Vit D.  Reports fatigue.    PHYSICAL EXAM:  Blood pressure (!) 86/56, pulse 80, temperature 98.4 F (36.9 C), height 5' 2 (1.575 m), weight 179 lb (81.2 kg), last menstrual period 02/26/2024, SpO2 99%. Body mass index is 32.74 kg/m.  General: She is overweight, cooperative, alert, well developed, and in no acute distress. PSYCH: Has normal mood, affect and thought process.   Extremities: No edema.  Neurologic: No gross sensory or motor deficits. No tremors or fasciculations noted.    DIAGNOSTIC DATA REVIEWED:  BMET    Component Value Date/Time   NA 138 02/25/2023 0853   K 5.0 02/25/2023 0853   CL 102 02/25/2023 0853   CO2 22 02/25/2023 0853   GLUCOSE 97 02/25/2023 0853   GLUCOSE 80 08/29/2021 1052   BUN 15 02/25/2023 0853   CREATININE 0.69 02/25/2023 0853   CREATININE 0.66 08/30/2013 1617   CALCIUM 9.7 02/25/2023 0853   GFRNONAA >60 08/29/2021 1052   GFRAA >60 09/28/2017 1406   Lab Results  Component Value Date   HGBA1C 5.3 02/25/2023   HGBA1C 5.4 11/01/2020   Lab Results  Component Value Date   INSULIN  12.8 02/25/2023   Lab Results  Component Value Date   TSH 0.632 02/25/2023   CBC    Component  Value Date/Time   WBC 4.4 08/25/2022 0834   WBC 6.4 08/29/2021 1052   RBC 4.56 08/25/2022 0834   RBC 4.24 08/29/2021 1052   HGB 13.4 08/25/2022 0834   HCT 39.6 08/25/2022 0834   PLT 298 08/25/2022 0834   MCV 87 08/25/2022 0834   MCH 29.4 08/25/2022 0834   MCH 29.0 08/29/2021 1052   MCHC 33.8 08/25/2022 0834   MCHC 32.5 08/29/2021 1052   RDW 11.9 08/25/2022 0834   Iron Studies    Component Value Date/Time   IRON 78 02/25/2023 0936   TIBC 279 02/25/2023 0936   FERRITIN 293 (H) 02/25/2023 0936   IRONPCTSAT 28 02/25/2023 0936   Lipid Panel     Component Value Date/Time   CHOL 174 08/25/2022 0834   TRIG 93 08/25/2022 0834   HDL 61 08/25/2022 0834   CHOLHDL 2.9 08/25/2022 0834   LDLCALC 96 08/25/2022 0834   Hepatic Function Panel     Component Value Date/Time   PROT 6.7 02/25/2023 0853   ALBUMIN 4.3 02/25/2023 0853   AST 16 02/25/2023 0853   ALT 9 02/25/2023 0853   ALKPHOS 70 02/25/2023 0853   BILITOT 0.7 02/25/2023 0853      Component Value Date/Time   TSH 0.632 02/25/2023 0853   Nutritional Lab Results  Component Value Date   VD25OH 34.4 02/25/2023     ASSESSMENT AND PLAN  TREATMENT PLAN FOR OBESITY:  Recommended Dietary Goals  Allina is currently in the action stage of change. As such, her goal is to continue weight management plan. She has agreed to following a lower carbohydrate, vegetable and lean protein rich diet plan.  Needs to add a fruit to breakfast Needs to add a vegetable to lunch  Behavioral Intervention  We discussed the following Behavioral Modification Strategies today: increasing lean protein intake to established goals, decreasing simple carbohydrates , increasing vegetables, increasing fiber rich foods, increasing water intake , reading food labels , keeping healthy foods at home, continue to work on maintaining a reduced calorie state, getting the recommended amount of protein, incorporating whole foods, making healthy choices, staying  well hydrated and practicing mindfulness when eating., and increase protein intake, fibrous foods (25 grams per day for women, 30 grams for men) and water to improve satiety and decrease hunger signals. .  Additional resources provided today: NA  Recommended Physical Activity Goals  Clotilda has been advised to work up to 150 minutes of moderate intensity aerobic activity a week and strengthening exercises 2-3 times per week for cardiovascular health, weight loss maintenance and preservation of muscle mass.   She has agreed to Think about enjoyable ways to increase daily physical activity and overcoming barriers to exercise, Increase physical activity in their day  and reduce sedentary time (increase NEAT)., Continue to gradually increase the amount and intensity of exercise routine, Increase volume of physical activity to a goal of 240 minutes a week, and Combine aerobic and strengthening exercises for efficiency and improved cardiometabolic health.   Pharmacotherapy We discussed various medication options to help Allison Fischer with her weight loss efforts and we both agreed to continue Zepbound  7.5mg  every 10 days.  Side effects discussed.  Patient has had a BTL.    ASSOCIATED CONDITIONS ADDRESSED TODAY  Action/Plan  Elevated ferritin -     Ferritin  Screening for diabetes mellitus (DM) -     Hemoglobin A1c  Vitamin D  insufficiency -     VITAMIN D  25 Hydroxy (Vit-D Deficiency, Fractures)  Screening for hyperlipidemia -     Lipid Panel With LDL/HDL Ratio  History of anemia -     Ferritin -     CBC with Differential/Platelet -     Vitamin B12  Other fatigue -     Comprehensive metabolic panel with GFR -     Ferritin -     VITAMIN D  25 Hydroxy (Vit-D Deficiency, Fractures) -     CBC with Differential/Platelet -     TSH -     Vitamin B12  Medication management -     Comprehensive metabolic panel with GFR -     TSH  Obesity, Class I, BMI 30-34.9 -     TSH -     Tirzepatide -Weight  Management; Inject 7.5 mg into the skin once a week.  Dispense: 2 mL; Refill: 1       Bio Impedance reviewed with the patient Muscle mass decreased by 3 lbs-will continue to monitor-needs to meet protein goal and continue to add in resistance training  Return in about 4 weeks (around 04/18/2024).SABRA She was informed of the importance of frequent follow up visits to maximize her success with intensive lifestyle modifications for her multiple health conditions.   ATTESTASTION STATEMENTS:  Reviewed by clinician on day of visit: allergies, medications, problem list, medical history, surgical history, family history, social history, and previous encounter notes.     Corean SAUNDERS. Shataria Crist FNP-C

## 2024-03-22 ENCOUNTER — Other Ambulatory Visit: Payer: Self-pay | Admitting: Nurse Practitioner

## 2024-03-22 ENCOUNTER — Ambulatory Visit: Payer: Self-pay | Admitting: Nurse Practitioner

## 2024-03-22 DIAGNOSIS — R7989 Other specified abnormal findings of blood chemistry: Secondary | ICD-10-CM

## 2024-03-22 LAB — COMPREHENSIVE METABOLIC PANEL WITH GFR
ALT: 13 IU/L (ref 0–32)
AST: 17 IU/L (ref 0–40)
Albumin: 4.4 g/dL (ref 3.9–4.9)
Alkaline Phosphatase: 57 IU/L (ref 41–116)
BUN/Creatinine Ratio: 21 (ref 9–23)
BUN: 17 mg/dL (ref 6–20)
Bilirubin Total: 0.4 mg/dL (ref 0.0–1.2)
CO2: 18 mmol/L — ABNORMAL LOW (ref 20–29)
Calcium: 9.4 mg/dL (ref 8.7–10.2)
Chloride: 106 mmol/L (ref 96–106)
Creatinine, Ser: 0.82 mg/dL (ref 0.57–1.00)
Globulin, Total: 2.3 g/dL (ref 1.5–4.5)
Glucose: 88 mg/dL (ref 70–99)
Potassium: 5.2 mmol/L (ref 3.5–5.2)
Sodium: 140 mmol/L (ref 134–144)
Total Protein: 6.7 g/dL (ref 6.0–8.5)
eGFR: 95 mL/min/1.73 (ref >59)

## 2024-03-22 LAB — VITAMIN D 25 HYDROXY (VIT D DEFICIENCY, FRACTURES): Vit D, 25-Hydroxy: 38.8 ng/mL (ref 30.0–100.0)

## 2024-03-22 LAB — LIPID PANEL WITH LDL/HDL RATIO
Cholesterol, Total: 172 mg/dL (ref 100–199)
HDL: 46 mg/dL (ref >39)
LDL Chol Calc (NIH): 109 mg/dL — ABNORMAL HIGH (ref 0–99)
LDL/HDL Ratio: 2.4 ratio (ref 0.0–3.2)
Triglycerides: 89 mg/dL (ref 0–149)
VLDL Cholesterol Cal: 17 mg/dL (ref 5–40)

## 2024-03-22 LAB — CBC WITH DIFFERENTIAL/PLATELET
Basophils Absolute: 0 x10E3/uL (ref 0.0–0.2)
Basos: 1 %
EOS (ABSOLUTE): 0 x10E3/uL (ref 0.0–0.4)
Eos: 1 %
Hematocrit: 39.4 % (ref 34.0–46.6)
Hemoglobin: 12.5 g/dL (ref 11.1–15.9)
Immature Grans (Abs): 0 x10E3/uL (ref 0.0–0.1)
Immature Granulocytes: 0 %
Lymphocytes Absolute: 1.9 x10E3/uL (ref 0.7–3.1)
Lymphs: 30 %
MCH: 28.1 pg (ref 26.6–33.0)
MCHC: 31.7 g/dL (ref 31.5–35.7)
MCV: 89 fL (ref 79–97)
Monocytes Absolute: 0.3 x10E3/uL (ref 0.1–0.9)
Monocytes: 5 %
Neutrophils Absolute: 4.1 x10E3/uL (ref 1.4–7.0)
Neutrophils: 62 %
Platelets: 404 x10E3/uL (ref 150–450)
RBC: 4.45 x10E6/uL (ref 3.77–5.28)
RDW: 12.1 % (ref 11.7–15.4)
WBC: 6.5 x10E3/uL (ref 3.4–10.8)

## 2024-03-22 LAB — FERRITIN: Ferritin: 551 ng/mL — ABNORMAL HIGH (ref 15–150)

## 2024-03-22 LAB — HEMOGLOBIN A1C
Est. average glucose Bld gHb Est-mCnc: 100 mg/dL
Hgb A1c MFr Bld: 5.1 % (ref 4.8–5.6)

## 2024-03-22 LAB — TSH: TSH: 0.602 u[IU]/mL (ref 0.450–4.500)

## 2024-03-22 LAB — VITAMIN B12: Vitamin B-12: >2000 pg/mL — ABNORMAL HIGH (ref 232–1245)

## 2024-03-30 LAB — IRON: Iron: 58 ug/dL (ref 27–159)

## 2024-03-30 LAB — SPECIMEN STATUS REPORT

## 2024-04-07 ENCOUNTER — Other Ambulatory Visit: Payer: Self-pay | Admitting: Family Medicine

## 2024-04-07 DIAGNOSIS — E66811 Obesity, class 1: Secondary | ICD-10-CM

## 2024-04-20 ENCOUNTER — Ambulatory Visit: Admitting: Family Medicine

## 2024-04-20 ENCOUNTER — Encounter: Payer: Self-pay | Admitting: Family Medicine

## 2024-04-20 VITALS — BP 109/72 | HR 108 | Temp 98.6°F | Ht 62.0 in | Wt 185.0 lb

## 2024-04-20 DIAGNOSIS — E559 Vitamin D deficiency, unspecified: Secondary | ICD-10-CM

## 2024-04-20 DIAGNOSIS — Z6833 Body mass index (BMI) 33.0-33.9, adult: Secondary | ICD-10-CM

## 2024-04-20 DIAGNOSIS — E66811 Obesity, class 1: Secondary | ICD-10-CM

## 2024-04-20 DIAGNOSIS — R632 Polyphagia: Secondary | ICD-10-CM

## 2024-04-20 MED ORDER — TIRZEPATIDE-WEIGHT MANAGEMENT 10 MG/0.5ML ~~LOC~~ SOLN
10.0000 mg | SUBCUTANEOUS | 0 refills | Status: DC
Start: 2024-04-20 — End: 2024-05-17

## 2024-04-20 NOTE — Progress Notes (Signed)
 Office: [phone]  /  Fax: [phone]  WEIGHT SUMMARY AND BIOMETRICS  Starting Date: 02/25/23  Starting Weight: 186lb   Weight Lost Since Last Visit: 0lb   Vitals Temp: 98.6 F (37 C) BP: 109/72 Pulse Rate: (!) 108 SpO2: 99 %   Body Composition  Body Fat %: 39.6 % Fat Mass (lbs): 73.4 lbs Muscle Mass (lbs): 106.4 lbs Total Body Water (lbs): 75.4 lbs Visceral Fat Rating : 8    HPI  Chief Complaint: OBESITY  Allison Fischer is here to discuss her progress with her obesity treatment plan. She is on the the Category 3 Plan and states she is following her eating plan approximately 80 % of the time. She states she is exercising 60 minutes 3-4 times per week.  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 Her mom continues to help planning dinners but these are usually high carb She denies snacking or craving sugar She has been doing fairly well planning out and eating lean protein and fiber with breakfast and lunch She has felt less satiety and fullness from Zepbound  lately and has tolerated it well without adverse side effects She now has a net weight loss of 1 pound and 13 months of medically supervised weight management  Pharmacotherapy: Zepbound  7.5 mg injection via [employer]  PHYSICAL EXAM:  Blood pressure 109/72, pulse (!) 108, temperature 98.6 F (37 C), height 5' 2 (1.575 m), weight 185 lb (83.9 kg), SpO2 99%. Body mass index is 33.84 kg/m.  General: She is overweight, cooperative, alert, well developed, and in no acute distress. PSYCH: Has normal mood, affect and thought process.   Lungs: Normal breathing effort, no conversational dyspnea.   ASSESSMENT AND PLAN  TREATMENT PLAN FOR  OBESITY:  Recommended Dietary Goals  Allison Fischer is currently in the action stage of change. As such, her goal is to continue weight management plan. She has agreed to the Category 3 Plan.  Behavioral Intervention  We discussed the following Behavioral Modification Strategies today: increasing lean protein intake to established goals, increasing fiber rich foods, increasing water intake , work on meal planning and preparation, keeping healthy foods at home, identifying sources and decreasing liquid calories, decreasing eating out or consumption of processed foods, and making healthy choices when eating convenient foods, practice mindfulness eating and understand the difference between hunger signals and cravings, work on managing stress, creating time for self-care and relaxation, avoiding temptations and identifying enticing environmental cues, and planning for success.  Additional resources provided today: NA  Recommended Physical Activity Goals  Allison Fischer has been advised to work up to 150 minutes of moderate intensity aerobic activity a week and strengthening exercises 2-3 times per week for cardiovascular health, weight loss maintenance and preservation of muscle mass.   She has agreed to Increase the intensity, frequency or duration of strengthening exercises  and Increase the intensity, frequency or duration of aerobic exercises    Pharmacotherapy changes for the treatment of obesity: Increase Zepbound  to 10 mg once weekly injection, moving injection from q. 10 days to q. 7 days  ASSOCIATED CONDITIONS ADDRESSED TODAY  Polyphagia Worsened with increasing gym workouts in the morning Reviewed eating schedule.  She  may have a small snack preworkout and her breakfast post workout to help alleviate excess hunger Continue to work on lean protein and fiber with meals, limiting meals now and adding in more nonstarchy vegetables to dinner while reducing carbs servings Look for improvements in  polyphasia with increasing Zepbound  dose and moving to q. 7 days  Obesity, Class I, BMI 30-34.9 -     Tirzepatide -Weight Management; Inject 10 mg into the skin once a week.  Dispense: 2 mL; Refill: 0  BMI 33.0-33.9,adult  Vitamin D  insufficiency Last vitamin D  Lab Results  Component Value Date   VD25OH 38.8 03/21/2024  She has restarted vitamin D  2000 IU once daily.  Repeat lab in 3 months      She was informed of the importance of frequent follow up visits to maximize her success with intensive lifestyle modifications for her multiple health conditions.   ATTESTASTION STATEMENTS:  Reviewed by clinician on day of visit: allergies, medications, problem list, medical history, surgical history, family history, social history, and previous encounter notes pertinent to obesity diagnosis.   I have personally spent 30 minutes total time today in preparation, patient care, nutritional counseling and education,  and documentation for this visit, including the following: review of most recent clinical lab tests, prescribing medications/ refilling medications, reviewing medical assistant documentation, review and interpretation of bioimpedence results.     [name], D.O. DABFM, DABOM [facility] [address] [place], [place] [address] [phone]

## 2024-04-20 NOTE — Patient Instructions (Signed)
 Move up on Zepbound  to once a week Move dose up to 10 mg weekly  Great job with workouts! Aim for 4-5 days/ wk  Vitamin D  2,000 international units  daily Keep easy items on hand to reduce starches with dinner: Microwave steamer veggies Raw carrots/ cucumbers

## 2024-05-17 ENCOUNTER — Ambulatory Visit: Admitting: Family Medicine

## 2024-05-17 ENCOUNTER — Encounter: Payer: Self-pay | Admitting: Family Medicine

## 2024-05-17 VITALS — BP 92/61 | HR 87 | Temp 98.0°F | Ht 62.0 in | Wt 180.0 lb

## 2024-05-17 DIAGNOSIS — Z7289 Other problems related to lifestyle: Secondary | ICD-10-CM

## 2024-05-17 DIAGNOSIS — Z6832 Body mass index (BMI) 32.0-32.9, adult: Secondary | ICD-10-CM | POA: Diagnosis not present

## 2024-05-17 DIAGNOSIS — Z9189 Other specified personal risk factors, not elsewhere classified: Secondary | ICD-10-CM

## 2024-05-17 DIAGNOSIS — E66811 Obesity, class 1: Secondary | ICD-10-CM

## 2024-05-17 MED ORDER — TIRZEPATIDE-WEIGHT MANAGEMENT 10 MG/0.5ML ~~LOC~~ SOLN: 10.0000 mg | SUBCUTANEOUS | 0 refills | Status: DC

## 2024-05-17 NOTE — Progress Notes (Signed)
 Office: [phone]  /  Fax: [phone]  WEIGHT SUMMARY AND BIOMETRICS  Starting Date: 02/25/23  Starting Weight: 186lb   Weight Lost Since Last Visit: 5lb   Vitals Temp: 98 F (36.7 C) BP: 92/61 Pulse Rate: 87 SpO2: 100 %   Body Composition  Body Fat %: 38.3 % Fat Mass (lbs): 69 lbs Muscle Mass (lbs): 105.4 lbs Total Body Water (lbs): 72.8 lbs Visceral Fat Rating : 8    HPI  Chief Complaint: OBESITY  Allison Fischer is here to discuss her progress with her obesity treatment plan. She is on the the Category 3 Plan and states she is following her eating plan approximately 50 % of the time. She states she is exercising 60 minutes 5 times per week.  Interval History:  Since last office visit she is down 5 lb  This gives her a net weight loss of 6 lb in 14 mos of  medically supervised weight management She did go up on Zebpound to 10 mg , x 2 injections with improved satiety and without meal skipping or GI upset She is mindful of lean protein, fruits and veggie intake She limits coffee due to reflux SE She is working out more frequently at the gym doing spin and some weight training with a total of 5 days/ wk She is working on her water intake  Pharmacotherapy: Zepbound  10 mg once weekly injection  PHYSICAL EXAM:  Blood pressure 92/61, pulse 87, temperature 98 F (36.7 C), height 5' 2 (1.575 m), weight 180 lb (81.6 kg), SpO2 100%. Body mass index is 32.92 kg/m.  General: She is obese appearing, cooperative, alert, well developed, and in no acute distress. PSYCH: Has normal mood, affect and thought process.   Lungs: Normal breathing effort, no conversational dyspnea.   ASSESSMENT AND PLAN  TREATMENT PLAN FOR OBESITY:  Recommended Dietary Goals  Allison Fischer is currently in the action stage of change. As such, her goal is to continue weight management plan. She has agreed to the Category 3 Plan.  Behavioral Intervention  We discussed the following Behavioral  Modification Strategies today: increasing lean protein intake to established goals, increasing vegetables, increasing lower glycemic fruits, increasing fiber rich foods, increasing water intake , keeping healthy foods at home, decreasing eating out or consumption of processed foods, and making healthy choices when eating convenient foods, work on managing stress, creating time for self-care and relaxation, avoiding temptations and identifying enticing environmental cues, continue to practice mindfulness when eating, and celebration eating strategies.  Additional resources provided today: NA  Recommended Physical Activity Goals  Allison Fischer has been advised to work up to 150 minutes of moderate intensity aerobic activity a week and strengthening exercises 2-3 times per week for cardiovascular health, weight loss maintenance and preservation of muscle mass.   She has agreed to Continue current level of physical activity  Congratulated her on ramping up workout frequency to 5 days a week including both cardio and resistance training  Pharmacotherapy changes for the treatment of obesity: None  ASSOCIATED CONDITIONS ADDRESSED TODAY  Sedentary lifestyle Much improved.  That her job is fairly sedentary, she has really ramped up her exercise over the past month to include gym workouts with cardio and resistance training 5 days a week.  Her energy level has improved and she is happy to see weight reduction.  Obesity, Class I, BMI 30-34.9 -     Tirzepatide -Weight Management; Inject 10 mg into the skin once a week.  Dispense: 2 mL; Refill: 0 She has  adequate satiety from 10 mg of Zepbound  once weekly injection via [employer].  She denies meal skipping or GI upset.  Continue current dose of Zepbound .  Patient was counseled on the importance of maintaining healthy lifestyle habits, including balanced nutrition, regular physical activity, and behavioral modifications, while taking antiobesity medication.   Patient verbalized understanding that medication is an adjunct to, not a replacement for, lifestyle changes and that the long-term success and weight maintenance depend on continued adherence to these strategies.  BMI 32.0-32.9,adult      She was informed of the importance of frequent follow up visits to maximize her success with intensive lifestyle modifications for her multiple health conditions.   ATTESTASTION STATEMENTS:  Reviewed by clinician on day of visit: allergies, medications, problem list, medical history, surgical history, family history, social history, and previous encounter notes pertinent to obesity diagnosis.      [name], D.O. DABFM, DABOM [facility] [address] [place], [place] [address] [phone]

## 2024-05-17 NOTE — Patient Instructions (Signed)
 Take a Women's Multivitamin Daily and vitamin D  daily  Continue Zepbound  at 10 mg weekly  Continue regular workouts

## 2024-06-29 ENCOUNTER — Ambulatory Visit: Admitting: Family Medicine

## 2024-06-29 ENCOUNTER — Encounter: Payer: Self-pay | Admitting: Family Medicine

## 2024-06-29 VITALS — BP 94/58 | HR 87 | Ht 62.0 in | Wt 176.0 lb

## 2024-06-29 DIAGNOSIS — R1013 Epigastric pain: Secondary | ICD-10-CM

## 2024-06-29 DIAGNOSIS — E66811 Obesity, class 1: Secondary | ICD-10-CM | POA: Diagnosis not present

## 2024-06-29 DIAGNOSIS — Z6832 Body mass index (BMI) 32.0-32.9, adult: Secondary | ICD-10-CM

## 2024-06-29 DIAGNOSIS — R632 Polyphagia: Secondary | ICD-10-CM | POA: Diagnosis not present

## 2024-06-29 MED ORDER — TIRZEPATIDE-WEIGHT MANAGEMENT 10 MG/0.5ML ~~LOC~~ SOLN: 10.0000 mg | SUBCUTANEOUS | 1 refills | Status: AC

## 2024-06-29 NOTE — Progress Notes (Signed)
 "  Office: [phone]  /  Fax: [phone]  WEIGHT SUMMARY AND BIOMETRICS  Starting Date: 02/25/23  Starting Weight: 186lb   Weight Lost Since Last Visit: 4lb   Vitals BP: (!) 94/58 Pulse Rate: 87 SpO2: 99 %   Body Composition  Body Fat %: 38.7 % Fat Mass (lbs): 68.4 lbs Muscle Mass (lbs): 102.8 lbs Total Body Water (lbs): 74.2 lbs Visceral Fat Rating : 8    HPI  Chief Complaint: OBESITY  Allison Fischer is here to discuss her progress with her obesity treatment plan. She is on the the Category 3 Plan and states she is following her eating plan approximately 80 % of the time. She states she is exercising 60 minutes 3-4 times per week.  Interval History:  Since last office visit she is down 4 lb She is down 2.6 lb of muscle mass and down 0.6 lb of body fat since last visit She has a net weight loss of 10 lb in 15 mos of medically supervised weight management She has done well on Zepbound  10 mg weekly injection She is working out at the gym 3-4 days/ wk with spin class 3 x a week and weight training 2 x a week She has been consistent with food choices She denies GI upset or meal skipping   Pharmacotherapy: Zepbound  10 mg vials q 10--> 7 days/ wk  PHYSICAL EXAM:  Blood pressure (!) 94/58, pulse 87, height 5' 2 (1.575 m), weight 176 lb (79.8 kg), SpO2 99%. Body mass index is 32.19 kg/m.  General: She is healthy appearing,  cooperative, alert, well developed, and in no acute distress. PSYCH: Has normal mood, affect and thought process.   Lungs: Normal breathing effort, no conversational dyspnea.  ASSESSMENT AND PLAN  TREATMENT PLAN FOR OBESITY:  Recommended Dietary Goals  Allison Fischer is currently in the action stage of change. As such, her goal is to continue weight management plan. She has agreed to the Category 3 Plan and keeping a food journal and adhering to recommended goals of 1500 calories and 85 g of  protein. Add in a 20 g protein shake or greek yogurt daily to  increase daily protein intake and reduce muscle loss  Behavioral Intervention  We discussed the following Behavioral Modification Strategies today: increasing lean protein intake to established goals, increasing fiber rich foods, avoiding skipping meals, increasing water intake , work on tracking and journaling calories using tracking application, keeping healthy foods at home, avoiding temptations and identifying enticing environmental cues, and continue to practice mindfulness when eating.  Additional resources provided today: NA  Recommended Physical Activity Goals  Allison Fischer has been advised to work up to 150 minutes of moderate intensity aerobic activity a week and strengthening exercises 2-3 times per week for cardiovascular health, weight loss maintenance and preservation of muscle mass.   She has agreed to Continue current level of physical activity   Pharmacotherapy changes for the treatment of obesity: none  ASSOCIATED CONDITIONS ADDRESSED TODAY  Polyphagia Much improved on Zepbound  and did not see active weight loss until start of Zepbound  in April 2025.    Continue to work on meal planning, lean protein and fiber with meals, increasing water intake, reducing sugar and ultra processed foods that cause a rise in hunger  Has adequate satiety from this dose of Zepbound , will continue weekly  Obesity, Class I, BMI 30-34.9 Improving; reviewed body composition changes Great job with exercise consistency, reviewed gym check ins on her phone x 2 mos with avg 4  x a week.  Add 20 g protein greek yogurt or protein shake daily to help reduce muscle loss. -     Tirzepatide -Weight Management; Inject 10 mg into the skin once a week.  Dispense: 2 mL; Refill: 1  Patient was counseled on the importance of maintaining healthy lifestyle habits, including balanced nutrition, regular physical activity, and behavioral modifications, while taking antiobesity medication.  Patient verbalized understanding  that medication is an adjunct to, not a replacement for, lifestyle changes and that the long-term success and weight maintenance depend on continued adherence to these strategies.  BMI 32.0-32.9,adult  Dyspepsia Occurring occasionally from Zepbound , using Pepto prn Avoid late night eating, high acid foods or large food volumes especially at night Recommend Tums or Pepcid prn instead of Pepto.   She was informed of the importance of frequent follow up visits to maximize her success with intensive lifestyle modifications for her multiple health conditions.   ATTESTASTION STATEMENTS:  Reviewed by clinician on day of visit: allergies, medications, problem list, medical history, surgical history, family history, social history, and previous encounter notes pertinent to obesity diagnosis.   I have personally spent 30 minutes total time today in preparation, patient care, nutritional counseling and education,  and documentation for this visit, including the following: review of most recent clinical lab tests, prescribing medications/ refilling medications, reviewing medical assistant documentation, review and interpretation of bioimpedence results.     [name], D.O. DABFM, DABOM [facility] [address] [place], [place] [address] [phone] "

## 2024-06-29 NOTE — Patient Instructions (Addendum)
 Take a Womens Multivitamin daily + OTC vitamin D3 2,000 international units  daily for maintenance  Move Zepbound  to 10 mg once a week for the next 4 injections  Great job with workouts!  Add in a protein shake or greek yogurt with ~20 g of protein and < 8 g of sugar daily

## 2024-08-08 ENCOUNTER — Ambulatory Visit: Admitting: Family Medicine
